# Patient Record
Sex: Female | Born: 1960 | Race: White | Hispanic: No | Marital: Married | Smoking: Former smoker
Health system: Southern US, Community
[De-identification: ages and names within clinical notes are randomized; demographics above are authoritative.]

## PROBLEM LIST (undated history)

## (undated) DIAGNOSIS — I10 Essential (primary) hypertension: Secondary | ICD-10-CM

## (undated) DIAGNOSIS — E119 Type 2 diabetes mellitus without complications: Secondary | ICD-10-CM

## (undated) DIAGNOSIS — F329 Major depressive disorder, single episode, unspecified: Secondary | ICD-10-CM

## (undated) DIAGNOSIS — I252 Old myocardial infarction: Secondary | ICD-10-CM

## (undated) DIAGNOSIS — Z87442 Personal history of urinary calculi: Secondary | ICD-10-CM

## (undated) DIAGNOSIS — M542 Cervicalgia: Secondary | ICD-10-CM

## (undated) DIAGNOSIS — R011 Cardiac murmur, unspecified: Secondary | ICD-10-CM

## (undated) DIAGNOSIS — F419 Anxiety disorder, unspecified: Secondary | ICD-10-CM

## (undated) DIAGNOSIS — I639 Cerebral infarction, unspecified: Secondary | ICD-10-CM

## (undated) DIAGNOSIS — F32A Depression, unspecified: Secondary | ICD-10-CM

## (undated) HISTORY — DX: Major depressive disorder, single episode, unspecified: F32.9

## (undated) HISTORY — DX: Essential (primary) hypertension: I10

## (undated) HISTORY — DX: Personal history of urinary calculi: Z87.442

## (undated) HISTORY — DX: Cardiac murmur, unspecified: R01.1

## (undated) HISTORY — DX: Depression, unspecified: F32.A

## (undated) HISTORY — DX: Cervicalgia: M54.2

## (undated) HISTORY — DX: Cerebral infarction, unspecified: I63.9

## (undated) HISTORY — DX: Old myocardial infarction: I25.2

## (undated) HISTORY — DX: Anxiety disorder, unspecified: F41.9

## (undated) HISTORY — PX: BACK SURGERY: SHX140

## (undated) HISTORY — PX: TUBAL LIGATION: SHX77

---

## 1998-09-07 ENCOUNTER — Ambulatory Visit (HOSPITAL_COMMUNITY): Admission: RE | Admit: 1998-09-07 | Discharge: 1998-09-07 | Payer: Self-pay | Admitting: Family Medicine

## 1998-09-07 ENCOUNTER — Encounter: Payer: Self-pay | Admitting: Family Medicine

## 1998-12-20 ENCOUNTER — Ambulatory Visit (HOSPITAL_COMMUNITY): Admission: RE | Admit: 1998-12-20 | Discharge: 1998-12-20 | Payer: Self-pay | Admitting: Family Medicine

## 1998-12-20 ENCOUNTER — Encounter: Payer: Self-pay | Admitting: Family Medicine

## 1999-07-05 ENCOUNTER — Encounter: Payer: Self-pay | Admitting: Neurology

## 1999-07-05 ENCOUNTER — Encounter: Admission: RE | Admit: 1999-07-05 | Discharge: 1999-07-05 | Payer: Self-pay | Admitting: Neurology

## 2000-07-29 ENCOUNTER — Emergency Department (HOSPITAL_COMMUNITY): Admission: EM | Admit: 2000-07-29 | Discharge: 2000-07-29 | Payer: Self-pay | Admitting: Emergency Medicine

## 2000-09-12 ENCOUNTER — Encounter: Admission: RE | Admit: 2000-09-12 | Discharge: 2000-09-12 | Payer: Self-pay | Admitting: Family Medicine

## 2000-09-12 ENCOUNTER — Encounter: Payer: Self-pay | Admitting: Family Medicine

## 2000-11-06 ENCOUNTER — Encounter: Admission: RE | Admit: 2000-11-06 | Discharge: 2000-12-12 | Payer: Self-pay | Admitting: Neurological Surgery

## 2005-12-31 ENCOUNTER — Ambulatory Visit: Payer: Self-pay | Admitting: Internal Medicine

## 2005-12-31 ENCOUNTER — Inpatient Hospital Stay (HOSPITAL_COMMUNITY): Admission: EM | Admit: 2005-12-31 | Discharge: 2006-01-02 | Payer: Self-pay | Admitting: *Deleted

## 2006-01-01 ENCOUNTER — Encounter (INDEPENDENT_AMBULATORY_CARE_PROVIDER_SITE_OTHER): Payer: Self-pay | Admitting: Cardiology

## 2006-02-26 ENCOUNTER — Ambulatory Visit: Payer: Self-pay | Admitting: Internal Medicine

## 2006-08-25 ENCOUNTER — Ambulatory Visit (HOSPITAL_COMMUNITY): Admission: RE | Admit: 2006-08-25 | Discharge: 2006-08-25 | Payer: Self-pay | Admitting: Family Medicine

## 2008-02-19 ENCOUNTER — Inpatient Hospital Stay (HOSPITAL_COMMUNITY): Admission: EM | Admit: 2008-02-19 | Discharge: 2008-02-20 | Payer: Self-pay | Admitting: Emergency Medicine

## 2008-09-15 ENCOUNTER — Emergency Department (HOSPITAL_COMMUNITY): Admission: EM | Admit: 2008-09-15 | Discharge: 2008-09-15 | Payer: Self-pay | Admitting: Emergency Medicine

## 2008-10-27 ENCOUNTER — Emergency Department (HOSPITAL_COMMUNITY): Admission: EM | Admit: 2008-10-27 | Discharge: 2008-10-27 | Payer: Self-pay | Admitting: Emergency Medicine

## 2009-01-23 ENCOUNTER — Emergency Department (HOSPITAL_COMMUNITY): Admission: EM | Admit: 2009-01-23 | Discharge: 2009-01-23 | Payer: Self-pay | Admitting: Emergency Medicine

## 2009-05-08 ENCOUNTER — Emergency Department (HOSPITAL_COMMUNITY): Admission: EM | Admit: 2009-05-08 | Discharge: 2009-05-08 | Payer: Self-pay | Admitting: Emergency Medicine

## 2009-05-10 ENCOUNTER — Encounter: Admission: RE | Admit: 2009-05-10 | Discharge: 2009-05-10 | Payer: Self-pay | Admitting: Family Medicine

## 2009-05-16 ENCOUNTER — Encounter: Admission: RE | Admit: 2009-05-16 | Discharge: 2009-05-16 | Payer: Self-pay | Admitting: Family Medicine

## 2009-06-04 ENCOUNTER — Encounter: Admission: RE | Admit: 2009-06-04 | Discharge: 2009-06-04 | Payer: Self-pay | Admitting: Orthopedic Surgery

## 2009-06-13 ENCOUNTER — Encounter: Admission: RE | Admit: 2009-06-13 | Discharge: 2009-06-13 | Payer: Self-pay | Admitting: Orthopedic Surgery

## 2009-06-15 ENCOUNTER — Emergency Department (HOSPITAL_COMMUNITY): Admission: EM | Admit: 2009-06-15 | Discharge: 2009-06-15 | Payer: Self-pay | Admitting: Emergency Medicine

## 2009-07-05 ENCOUNTER — Encounter: Admission: RE | Admit: 2009-07-05 | Discharge: 2009-07-05 | Payer: Self-pay | Admitting: Orthopedic Surgery

## 2009-07-25 ENCOUNTER — Encounter: Admission: RE | Admit: 2009-07-25 | Discharge: 2009-07-25 | Payer: Self-pay | Admitting: Orthopedic Surgery

## 2009-10-14 ENCOUNTER — Emergency Department: Payer: Self-pay | Admitting: Unknown Physician Specialty

## 2009-12-01 ENCOUNTER — Ambulatory Visit: Payer: Self-pay | Admitting: Family Medicine

## 2009-12-01 DIAGNOSIS — M545 Low back pain, unspecified: Secondary | ICD-10-CM | POA: Insufficient documentation

## 2009-12-01 DIAGNOSIS — I10 Essential (primary) hypertension: Secondary | ICD-10-CM

## 2009-12-01 DIAGNOSIS — F329 Major depressive disorder, single episode, unspecified: Secondary | ICD-10-CM

## 2009-12-01 DIAGNOSIS — R011 Cardiac murmur, unspecified: Secondary | ICD-10-CM | POA: Insufficient documentation

## 2009-12-01 DIAGNOSIS — N281 Cyst of kidney, acquired: Secondary | ICD-10-CM

## 2009-12-01 DIAGNOSIS — K5730 Diverticulosis of large intestine without perforation or abscess without bleeding: Secondary | ICD-10-CM | POA: Insufficient documentation

## 2009-12-01 DIAGNOSIS — F411 Generalized anxiety disorder: Secondary | ICD-10-CM | POA: Insufficient documentation

## 2009-12-04 ENCOUNTER — Ambulatory Visit: Payer: Self-pay | Admitting: Family Medicine

## 2009-12-04 ENCOUNTER — Encounter: Payer: Self-pay | Admitting: Family Medicine

## 2009-12-04 LAB — CONVERTED CEMR LAB
ALT: 25 units/L (ref 0–35)
AST: 23 units/L (ref 0–37)
Alkaline Phosphatase: 116 units/L (ref 39–117)
Creatinine, Ser: 0.62 mg/dL (ref 0.40–1.20)
Potassium: 3.9 meq/L (ref 3.5–5.3)
Sodium: 138 meq/L (ref 135–145)

## 2010-06-20 ENCOUNTER — Encounter (INDEPENDENT_AMBULATORY_CARE_PROVIDER_SITE_OTHER): Payer: Self-pay | Admitting: *Deleted

## 2010-07-01 IMAGING — CT CT L SPINE W/ CM
3 of 12 series · 10 of 33 positions shown, 12 images · IV contrast (omnipaque)
Comparison: MRI lumbar spine 08/04/2008

July 10, 2009 –DUPLICATE COPY for exam association in [facility]. No change from original report.
 MYELOGRAM INJECTION
TECHNIQUE: Informed consent was obtained from the patient prior to
 the procedure, including potential complications of headache,
 allergy, infection and pain. Specific instructions were given
 regarding 24 hour bedrest post procedure to prevent post-LP
 headache. A timeout procedure was performed. With the patient
 prone, the lower back was prepped with Betadine. 1% Lidocaine was
 used for local anesthesia. Lumbar puncture was performed by the
 radiologist at the L4 level using a 22 gauge needle with return of
 clear CSF. 15 cc of Omnipaque 180 was injected into the
 subarachnoid space .
CLINICAL DATA: Low back pain.
TECHNIQUE: Multidetector CT imaging of the lumbar spine was
 performed following myelography. Multiplanar CT image
 reconstructions were also generated.

[Series 4: thin recons · axial · 0.27mm/px · z∈[-275,-148]mm · 4 of 681 slices shown, 5 images]
[im 137/681  soft-tissue]
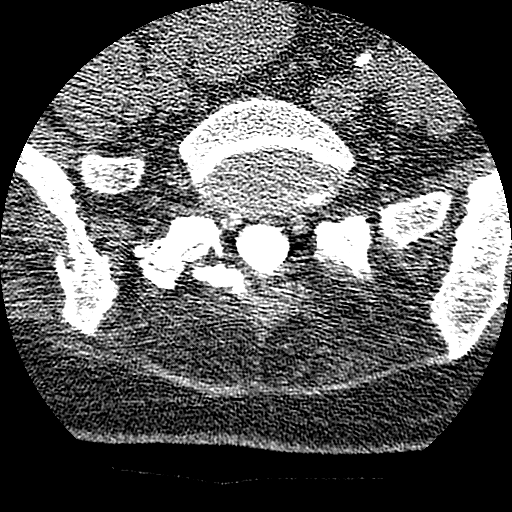
[im 137/681  bone]
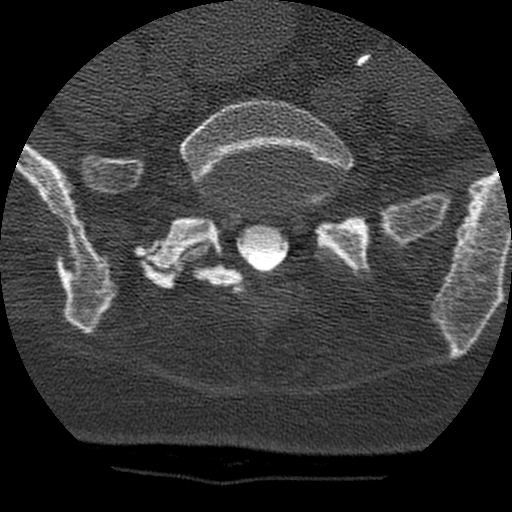
[im 273/681  bone]
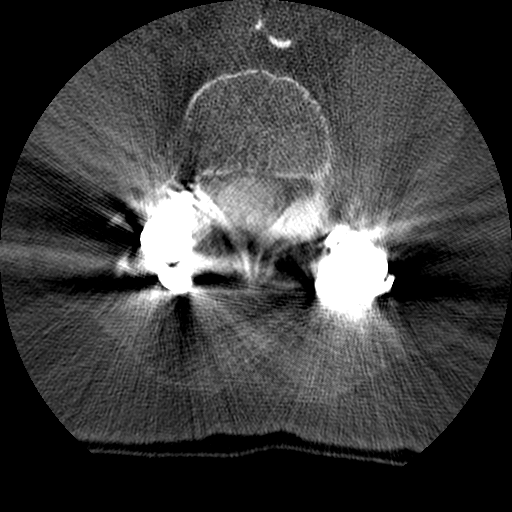
[im 409/681  bone]
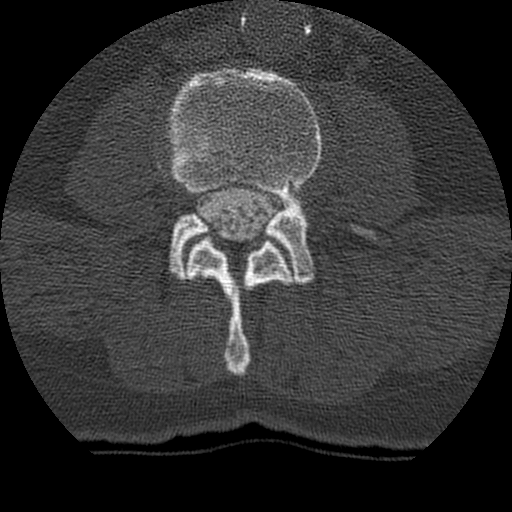
[im 545/681  bone]
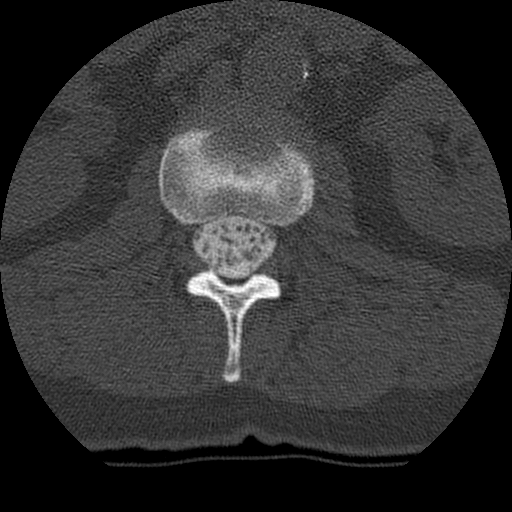

[Series 401: cor lower l-spine · coronal · 0.42mm/px · 1 of 38 slices shown]
[im 19/38  bone]
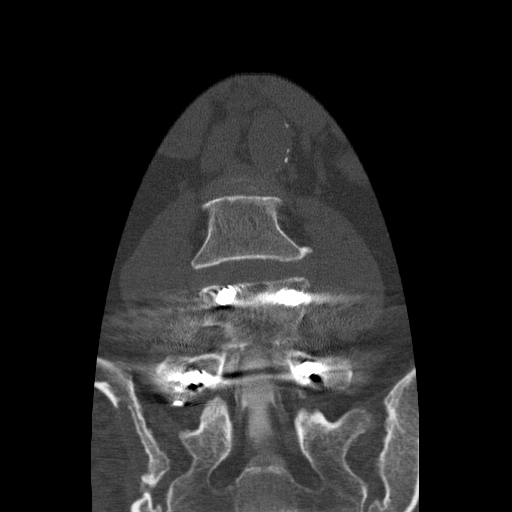

[Series 402: sag l-spine · sagittal · 0.42mm/px · 5 of 50 slices shown, 6 images]
[im 17/50  bone]
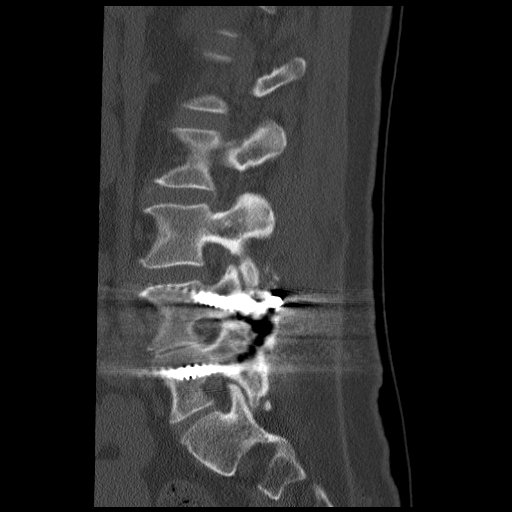
[im 21/50  bone]
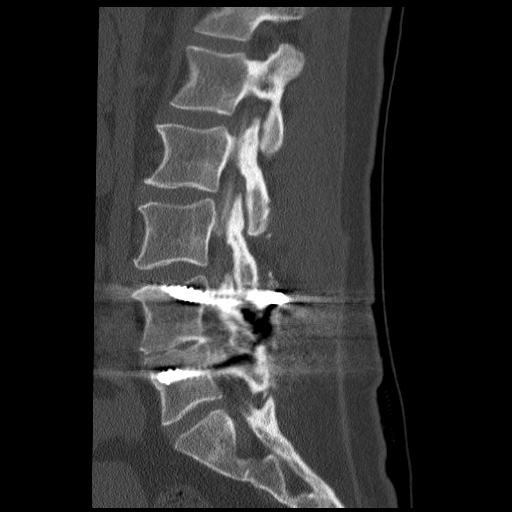
[im 25/50  soft-tissue]
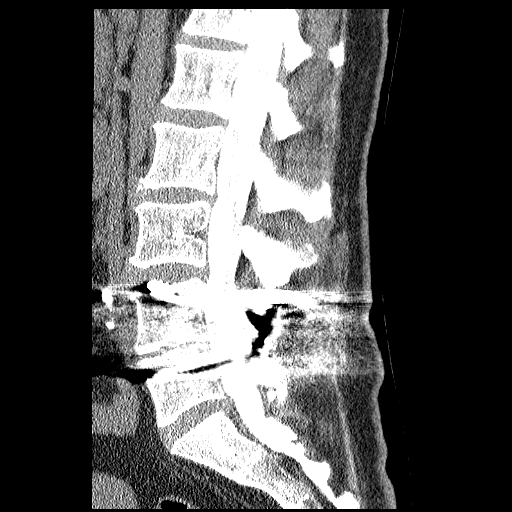
[im 25/50  bone]
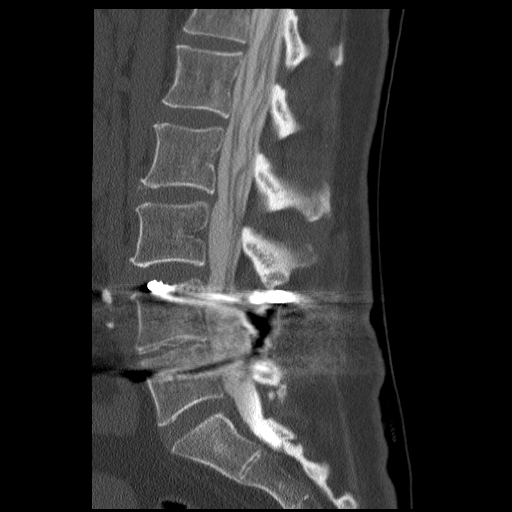
[im 29/50  bone]
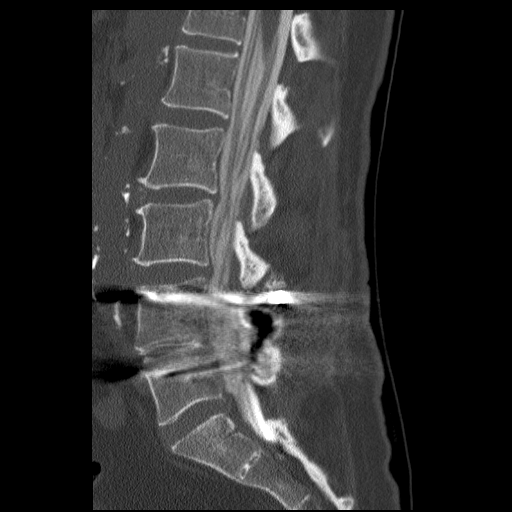
[im 33/50  bone]
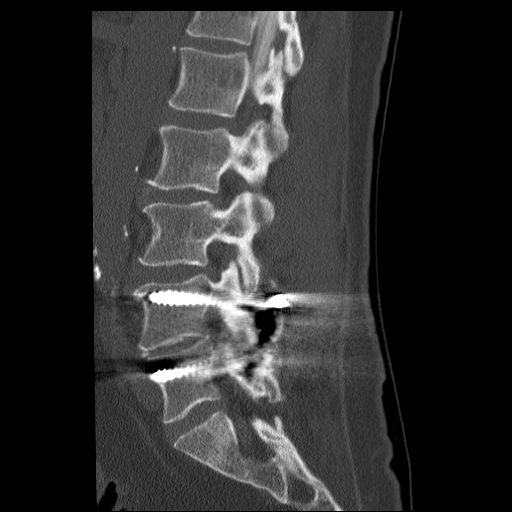

[10 of 33 positions shown; findings below may reference images not displayed]

IMPRESSION: Successful injection of intrathecal contrast for myelography.

 MYELOGRAM LUMBAR
FINDINGS: Good opacification lumbar subarachnoid space. Previous
 L4-5 fusion with instrumentation. Fusion appears solid. Hardware
 intact. Mild annular bulging at L3-4. Minimal effacement of the
 left L4 nerve root. Flexion/extension shows no dynamic
 instability.

 Fluoroscopy Time: 1.55 minutes
IMPRESSION: As above.

 CT MYELOGRAPHY LUMBAR SPINE
FINDINGS: No prevertebral or paraspinous masses. Mild
 nonaneurysmal vascular calcification.

 L1-2: Normal.

 L2-3: Mild bulge. No neural compression.

 L3-4: Shallow central and leftward protrusion. Minimal left L4
 nerve root effacement. No significant neural foraminal narrowing.
 The right-sided screw at L4 projects into the disc space slightly,
 but does not displace the right-sided nerve roots.

 L4-5: Susceptibility artifact degrades the images. No residual
 stenosis. No evidence for arachnoiditis. No significant neural
 foraminal narrowing. Solid fusion.

 L5-S1: Mild facet arthropathy. Minimal central and leftward
 bulge/subligamentous protrusion. This contacts, but does not seem
 to displace, left S1 nerve root.
IMPRESSION: Solid fusion L4-5 with hardware. No residual stenosis or neural
 encroachment.

 Minimal adjacent segment disease at L3-4 and L5 -S1, without
 significant left-sided neural compression.

## 2010-08-21 NOTE — Miscellaneous (Signed)
Summary: medical record request  Clinical Lists Changes  Rec'd medical record request to be faxed to disability determination services faxed 03/29/2010 Marily Memos  June 20, 2010 10:04 AM

## 2010-08-21 NOTE — Assessment & Plan Note (Signed)
Summary: NP, tcb   Vital Signs:  Patient profile:   50 year old female Height:      61 inches Weight:      138.8 pounds BMI:     26.32 Temp:     98.7 degrees F oral Pulse rate:   79 / minute BP sitting:   105 / 62  (left arm) Cuff size:   regular  Vitals Entered By: Garen Grams LPN (Dec 01, 2009 1:55 PM) CC: New Patient Is Patient Diabetic? No Pain Assessment Patient in pain? yes     Location: back & Left leg Intensity: 9   CC:  New Patient.  History of Present Illness: HTN- on benezapril, HCTZ, amlodipine. reports prior h/o SBPs >200s. doing well now. no chest pain, SOB, peripheral edema, headaches, blurred vision.  Back pain- h/o 4 back surgeries. has required ultram (which reportedly doesn't help) and percocet in past. reports trying to continue PT exercises 3 times a week she was given previously. +numbness/tingling of toes of L foot. no loss of bowel or bladder continence.   Depression- out of work. likely splitting from husband. in lots of pain. on citalopram. previously on lorazepam as well. denies SI/HI.   Habits & Providers  Alcohol-Tobacco-Diet     Alcohol drinks/day: <1     Alcohol Counseling: not indicated; use of alcohol is not excessive or problematic     Tobacco Status: current     Tobacco Counseling: to quit use of tobacco products     Cigarette Packs/Day: 0.5  Exercise-Depression-Behavior     Does Patient Exercise: no     Exercise Counseling: to improve exercise regimen     Have you felt down or hopeless? yes     Have you felt little pleasure in things? yes     Depression Counseling: further diagnostic testing and/or other treatment is indicated     Drug Use: marijuanna  Current Medications (verified): 1)  Benazepril Hcl 40 Mg Tabs (Benazepril Hcl) .... One Tab By Mouth Daily 2)  Hydrochlorothiazide 25 Mg Tabs (Hydrochlorothiazide) .... One Tab By Mouth Daily 3)  Norvasc 10 Mg Tabs (Amlodipine Besylate) .... One Tab By Mouth Daily 4)  Citalopram  Hydrobromide 40 Mg Tabs (Citalopram Hydrobromide) .... One Tab By Mouth Daily  Allergies (verified): No Known Drug Allergies  Past History:  Past Medical History: Current Problems:  SYSTOLIC MURMUR (AOZ-308.2) RENAL CYST (ICD-593.2) ANXIETY (ICD-300.00) DEPRESSION (ICD-311) DIVERTICULOSIS OF COLON (ICD-562.10) BACK PAIN, LUMBAR, CHRONIC (ICD-724.2) HYPERTENSION, BENIGN ESSENTIAL (ICD-401.1)  Past Surgical History: back surgeries x4, last 1996 BTL 11  Family History: HTN- mother CVA- mother CAD- mother, father (died of MI at 33) DM- sister, mother, maternal aunts, MGM  Social History: lives with husband Jorja Loa), mother in Social worker, and brother in Social worker. may be splitting from husband. +tobacco (down to 1/2 ppd). smoking >30 years. +marijuana. occasional EtOH, unemployedSmoking Status:  current Packs/Day:  0.5 Does Patient Exercise:  no Drug Use:  marijuanna  Physical Exam  General:  appears older than stated age. NAD. vitals reviewed.  Eyes:  EOMI, PERRLA Ears:  hearing grossly normal Nose:  no rhinorrhea Mouth:  MMM Lungs:  distant breath sounds, prolonged expiratory phase. normal WOB.  Heart:  II/VI SEM at LUSB. RRR Abdomen:  NT ND+BS. soft   Detailed Back/Spine Exam  Lumbosacral Exam:  Inspection-deformity:    Normal Palpation-spinal tenderness:  Normal Range of Motion:    Forward Flexion:   60 degrees    Hyperextension:   25 degrees  Schober's:        >6 cm    Right Lateral Bend:   25 degrees    Left Lateral Bend:   25 degrees Lying Straight Leg Raise:    Right:  negative    Left:  positive Reverse Straight Leg Raise:    Left:  negative Contralateral Straight Leg Raise:    Right:  negative    Left:  negative Sciatic Notch:    There is no sciatic notch tenderness. Toe Walking:    Right:  normal    Left:  normal Heel Walking:    Right:  normal    Left:  normal Fabere Test:    Right:  negative    Left:  negative   Impression &  Recommendations:  Problem # 1:  HYPERTENSION, BENIGN ESSENTIAL (ICD-401.1) Assessment New  check labs. change to combo pill. refills provided  Her updated medication list for this problem includes:    Benazepril-hydrochlorothiazide 20-25 Mg Tabs (Benazepril-hydrochlorothiazide) ..... One tab by mouth daily    Norvasc 10 Mg Tabs (Amlodipine besylate) ..... One tab by mouth daily  Future Orders: Direct LDL-[facility] ([id number]) ... 12/02/2010 Comp Met-[facility] ([id number]) ... 12/02/2010  BP today: 105/62  Problem # 2:  BACK PAIN, LUMBAR, CHRONIC (ICD-724.2) Assessment: New trial of meloxicam. informed patient that i would not be providing narcotic pain meds. continue rehab exercises. if obtains orange card, would refer to PT. start gabapentin for neuropathic pain  Her updated medication list for this problem includes:    Meloxicam 15 Mg Tabs (Meloxicam) ..... One tab by mouth daily  Problem # 3:  DEPRESSION (ICD-311) Assessment: New refill citalopram.  Her updated medication list for this problem includes:    Citalopram Hydrobromide 40 Mg Tabs (Citalopram hydrobromide) ..... One tab by mouth daily  Problem # 4:  ANXIETY (ICD-300.00) Assessment: New citalopram  Her updated medication list for this problem includes:    Citalopram Hydrobromide 40 Mg Tabs (Citalopram hydrobromide) ..... One tab by mouth daily  Patient Instructions: 1)  Nice to have met you. Hope things get better for you! 2)  Your new medications are on the list below: Meloxicam (antiinflammatory/pain), Gabapentin (nerve pain).  3)  Follow up after you get in touch with [facility] Prescriptions: MELOXICAM 15 MG TABS (MELOXICAM) one tab by mouth daily  #30 x 1   Entered and Authorized by:   Lequita Asal  MD   Signed by:   Lequita Asal  MD on 12/01/2009   Method used:   Electronically to        [facility]* (retail)       [address]       [place], [place]  [address]       Ph: [phone]       Fax:  [phone]   RxID:   [id number] GABAPENTIN 300 MG CAPS (GABAPENTIN) one tab by mouth qhs  #30 x 1   Entered and Authorized by:   Lequita Asal  MD   Signed by:   Lequita Asal  MD on 12/01/2009   Method used:   Electronically to        [facility]* (retail)       [address]       [place], [place]  [address]       Ph: [phone]       Fax: [phone]   RxID:   [id number] CITALOPRAM HYDROBROMIDE 40 MG TABS (CITALOPRAM HYDROBROMIDE) one tab by mouth daily  #90 x 1  Entered and Authorized by:   Lequita Asal  MD   Signed by:   Lequita Asal  MD on 12/01/2009   Method used:   Electronically to        [facility]* (retail)       [address]       [place], [place]  [address]       Ph: [phone]       Fax: [phone]   RxID:   [id number] NORVASC 10 MG TABS (AMLODIPINE BESYLATE) one tab by mouth daily  #90 x 1   Entered and Authorized by:   Lequita Asal  MD   Signed by:   Lequita Asal  MD on 12/01/2009   Method used:   Electronically to        [facility]* (retail)       [address]       [place], [place]  [address]       Ph: [phone]       Fax: [phone]   RxID:   [id number] BENAZEPRIL-HYDROCHLOROTHIAZIDE 20-25 MG TABS (BENAZEPRIL-HYDROCHLOROTHIAZIDE) one tab by mouth daily  #90 x 1   Entered and Authorized by:   Lequita Asal  MD   Signed by:   Lequita Asal  MD on 12/01/2009   Method used:   Electronically to        [facility]* (retail)       [address]       [place], [place]  [address]       Ph: [phone]       Fax: [phone]   RxID:   [id number]   Appended Document: NP, tcb     Allergies: No Known Drug Allergies   Complete Medication List: 1)  Benazepril-hydrochlorothiazide 20-25 Mg Tabs (Benazepril-hydrochlorothiazide) .... One tab by mouth daily 2)  Norvasc 10 Mg Tabs (Amlodipine besylate) .... One tab by mouth daily 3)  Citalopram Hydrobromide 40 Mg Tabs (Citalopram  hydrobromide) .... One tab by mouth daily 4)  Gabapentin 300 Mg Caps (Gabapentin) .... One tab by mouth qhs 5)  Meloxicam 15 Mg Tabs (Meloxicam) .... One tab by mouth daily    Medication Administration  Injection # 1:    Medication: Ketorolac-Toradol 15mg     Diagnosis: BACK PAIN, LUMBAR, CHRONIC (ICD-724.2)    Route: IM    Site: RUOQ gluteus    Exp Date: 06/22/2011    Lot #: [id number]    Mfr: hospira    Comments: pt given 60 ml    Patient tolerated injection without complications    Given by: Loralee Pacas CMA (Dec 01, 2009 5:35 PM)

## 2010-09-10 ENCOUNTER — Encounter: Payer: Self-pay | Admitting: *Deleted

## 2010-10-04 ENCOUNTER — Encounter: Payer: Self-pay | Admitting: Family Medicine

## 2010-10-04 ENCOUNTER — Ambulatory Visit (INDEPENDENT_AMBULATORY_CARE_PROVIDER_SITE_OTHER): Payer: Self-pay | Admitting: Family Medicine

## 2010-10-04 ENCOUNTER — Ambulatory Visit: Payer: Self-pay | Admitting: Family Medicine

## 2010-10-04 VITALS — BP 178/87 | HR 84 | Temp 98.5°F | Ht 60.0 in | Wt 132.9 lb

## 2010-10-04 DIAGNOSIS — F419 Anxiety disorder, unspecified: Secondary | ICD-10-CM

## 2010-10-04 DIAGNOSIS — F411 Generalized anxiety disorder: Secondary | ICD-10-CM

## 2010-10-04 DIAGNOSIS — R29898 Other symptoms and signs involving the musculoskeletal system: Secondary | ICD-10-CM

## 2010-10-04 DIAGNOSIS — R5383 Other fatigue: Secondary | ICD-10-CM

## 2010-10-04 DIAGNOSIS — I10 Essential (primary) hypertension: Secondary | ICD-10-CM

## 2010-10-04 DIAGNOSIS — M5430 Sciatica, unspecified side: Secondary | ICD-10-CM

## 2010-10-04 MED ORDER — GABAPENTIN 300 MG PO CAPS: 600.0000 mg | ORAL_CAPSULE | Freq: Three times a day (TID) | ORAL | Status: DC

## 2010-10-04 MED ORDER — CITALOPRAM HYDROBROMIDE 40 MG PO TABS: 40.0000 mg | ORAL_TABLET | Freq: Every day | ORAL | Status: DC

## 2010-10-04 MED ORDER — ALPRAZOLAM 0.5 MG PO TABS: 0.5000 mg | ORAL_TABLET | Freq: Three times a day (TID) | ORAL | Status: DC | PRN

## 2010-10-04 MED ORDER — BENAZEPRIL-HYDROCHLOROTHIAZIDE 20-25 MG PO TABS: 1.0000 | ORAL_TABLET | Freq: Every day | ORAL | Status: DC

## 2010-10-04 MED ORDER — AMLODIPINE BESYLATE 10 MG PO TABS: 10.0000 mg | ORAL_TABLET | Freq: Every day | ORAL | Status: DC

## 2010-10-04 NOTE — Progress Notes (Signed)
Subjective:    [name], female    DOB: [birth year], 50 y.o.   MRN: [id number]  HPI Left leg pain History of back surgery. Walks with limp. Has shooting radicular type pain with "needles" in left foot.  Sciatic distribution. No urinary/sensory/sexual deficits.   Fatigue Related to poor sleep at night secondary to leg pain, which frequently wakes her at night.  Bilateral arm weakness and hand numbness Weakness was not evident on examination. Her weakness is likely related to fatigue. Her hand numbness was subjective. She has a history of white matter changes on MRI per her report. She is worried about MS.   Anxiety She is very high energy, with pressured speech and fidgeting with her hands. She calmed down towards the end of the encounter, but this is causing her a lot of stress. She is HTN likely from her anxiety/pain. Her anxiety seems psychiatric, but could be related to her pain, recent separation from spouse, and loss of job.  HTN She was uncontrolled today. She has been out of her HTN meds for a couple weeks at least. She also has evident anxiety and pain.    Review of Systems  Constitutional: Positive for diaphoresis, activity change and fatigue. Negative for fever, chills, appetite change and unexpected weight change.  Respiratory: Negative for chest tightness and shortness of breath.   Cardiovascular: Positive for chest pain and palpitations. Negative for leg swelling.  Gastrointestinal: Negative for vomiting, abdominal pain and constipation.  Musculoskeletal: Positive for back pain. Negative for arthralgias.  Skin: Negative for rash.  Neurological: Positive for weakness and numbness. Negative for dizziness, tremors, seizures, syncope, facial asymmetry, speech difficulty, light-headedness and headaches.  Psychiatric/Behavioral: Positive for sleep disturbance and agitation. The patient is nervous/anxious and is hyperactive.        Objective:   Physical  Exam  Constitutional: She is oriented to person, place, and time. She appears well-developed and well-nourished.  HENT:  Head: Normocephalic and atraumatic.  Eyes: EOM are normal. Pupils are equal, round, and reactive to light.  Neck: Neck supple.  Cardiovascular: Normal rate, regular rhythm and normal heart sounds.   Pulmonary/Chest: Effort normal and breath sounds normal. No respiratory distress. She exhibits no tenderness.  Abdominal: Soft. She exhibits no distension. There is no tenderness.  Musculoskeletal: Normal range of motion.  Neurological: She is alert and oriented to person, place, and time. She has normal strength. No cranial nerve deficit or sensory deficit. GCS eye subscore is 4. GCS verbal subscore is 5. GCS motor subscore is 6.  Skin: Skin is warm and intact.  Psychiatric: Her mood appears anxious. Her affect is not angry and not inappropriate. Her speech is rapid and/or pressured. She is agitated and is hyperactive. She is not aggressive, not slowed, not withdrawn, not actively hallucinating and not combative. Thought content is not paranoid. Cognition and memory are not impaired. She does not express inappropriate judgment. She does not exhibit a depressed mood. She expresses no homicidal and no suicidal ideation. She is attentive.        Assessment & Plan:  Pt. Is a 50 y/o c/f with anxiety, htn, leg pain, fatigue. 1. Left leg pain Left sciatic nerve, neuropathic pain. Started Neurontin  2. Fatigue Related to poor sleep at night secondary to leg pain, which frequently wakes her at night. Xanax for her anxiety and neurontin for her pain will help her sleep at night.  3. Bilateral arm weakness and hand numbness Not consistent with my exam. Will  see how the neurontin helps her.  4. Anxiety Multifactorial. Xanax TID and continued citalopram.  5. HTN Refilled her medications that she has not taken for the last 2 weeks. Multifactorial.

## 2010-10-05 ENCOUNTER — Other Ambulatory Visit: Payer: Self-pay | Admitting: Family Medicine

## 2010-10-05 MED ORDER — AMLODIPINE BESYLATE 10 MG PO TABS: 10.0000 mg | ORAL_TABLET | Freq: Every day | ORAL | Status: DC

## 2010-10-05 MED ORDER — BENAZEPRIL-HYDROCHLOROTHIAZIDE 20-25 MG PO TABS: 1.0000 | ORAL_TABLET | Freq: Every day | ORAL | Status: DC

## 2010-10-05 MED ORDER — CITALOPRAM HYDROBROMIDE 40 MG PO TABS: 40.0000 mg | ORAL_TABLET | Freq: Every day | ORAL | Status: DC

## 2010-10-05 MED ORDER — GABAPENTIN 300 MG PO CAPS: 600.0000 mg | ORAL_CAPSULE | Freq: Three times a day (TID) | ORAL | Status: DC

## 2010-10-24 LAB — BASIC METABOLIC PANEL
GFR calc Af Amer: >60 mL/min (ref >60)
GFR calc non Af Amer: >60 mL/min (ref >60)
Glucose, Bld: 132 mg/dL — ABNORMAL HIGH (ref 70–99)
Potassium: 3.5 mEq/L (ref 3.5–5.1)
Sodium: 138 mEq/L (ref 135–145)

## 2010-10-24 LAB — CBC
HCT: 43 % (ref 36.0–46.0)
Hemoglobin: 14.8 g/dL (ref 12.0–15.0)
RBC: 4.7 MIL/uL (ref 3.87–5.11)
WBC: 9.7 10*3/uL (ref 4.0–10.5)

## 2010-10-24 LAB — URINALYSIS, ROUTINE W REFLEX MICROSCOPIC
Bilirubin Urine: NEGATIVE
Nitrite: NEGATIVE
Specific Gravity, Urine: 1.01 (ref 1.005–1.030)
pH: 7 (ref 5.0–8.0)

## 2010-10-24 LAB — DIFFERENTIAL
Basophils Absolute: 0 10*3/uL (ref 0.0–0.1)
Basophils Relative: 0 % (ref 0–1)
Eosinophils Absolute: 0.1 10*3/uL (ref 0.0–0.7)
Eosinophils Relative: 1 % (ref 0–5)
Neutrophils Relative %: 73 % (ref 43–77)

## 2010-10-24 LAB — PROTIME-INR
INR: 1 (ref 0.00–1.49)
Prothrombin Time: 13.1 seconds (ref 11.6–15.2)

## 2010-10-25 LAB — POCT URINALYSIS DIP (DEVICE)
Bilirubin Urine: NEGATIVE
Glucose, UA: NEGATIVE mg/dL
Ketones, ur: NEGATIVE mg/dL
Nitrite: NEGATIVE
pH: 6 (ref 5.0–8.0)

## 2010-11-26 ENCOUNTER — Encounter: Payer: Self-pay | Admitting: Family Medicine

## 2010-11-26 ENCOUNTER — Ambulatory Visit (INDEPENDENT_AMBULATORY_CARE_PROVIDER_SITE_OTHER): Payer: Self-pay | Admitting: Family Medicine

## 2010-11-26 VITALS — BP 157/78 | HR 61 | Temp 98.1°F | Ht 60.0 in | Wt 138.8 lb

## 2010-11-26 DIAGNOSIS — I10 Essential (primary) hypertension: Secondary | ICD-10-CM

## 2010-11-26 DIAGNOSIS — M545 Low back pain: Secondary | ICD-10-CM

## 2010-11-26 DIAGNOSIS — M5430 Sciatica, unspecified side: Secondary | ICD-10-CM

## 2010-11-26 DIAGNOSIS — G8929 Other chronic pain: Secondary | ICD-10-CM

## 2010-11-26 LAB — BASIC METABOLIC PANEL
BUN: 15 mg/dL (ref 6–23)
CO2: 29 mEq/L (ref 19–32)
Chloride: 103 mEq/L (ref 96–112)
Creat: 0.52 mg/dL (ref 0.40–1.20)
Potassium: 3.7 mEq/L (ref 3.5–5.3)

## 2010-11-26 MED ORDER — MELOXICAM 7.5 MG PO TABS: 7.5000 mg | ORAL_TABLET | Freq: Every day | ORAL | Status: DC

## 2010-11-26 MED ORDER — HYDROCHLOROTHIAZIDE 12.5 MG PO CAPS: 25.0000 mg | ORAL_CAPSULE | Freq: Every day | ORAL | Status: DC

## 2010-11-26 MED ORDER — LISINOPRIL 10 MG PO TABS: 10.0000 mg | ORAL_TABLET | Freq: Every day | ORAL | Status: DC

## 2010-11-26 MED ORDER — GABAPENTIN 800 MG PO TABS: 800.0000 mg | ORAL_TABLET | Freq: Every day | ORAL | Status: DC

## 2010-11-26 MED ORDER — CYCLOBENZAPRINE HCL 10 MG PO TABS: 10.0000 mg | ORAL_TABLET | Freq: Three times a day (TID) | ORAL | Status: DC | PRN

## 2010-11-26 NOTE — Progress Notes (Signed)
**Note Toni-Identified via Obfuscation**   Subjective:    [name], female    DOB: [birth year], 50 y.o.   MRN: [id number]  HPI 1. 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 See MRI/CT scan. Ortho rec: pain management. Patient has had 80% relief on the neurontin I prescribed.  2. Lower Back Pain Mainly paraspinal lumbar pain. Extensive paraspinal muscle tenderness and spasm.  No urinary symptoms. Some abnormal gait - but from left leg. She had no bony deformity or tenderness. No CVAT. No saddle anesthesia.  3. HTN Patients BP was elevated, this is likely because she has not been taking her benazepril/hctz - her pharmacy does not stock this combined pill.  Review of Systems  Constitutional: Positive for activity change. Negative for fever, chills, fatigue and unexpected weight change.  Eyes: Negative for photophobia and visual disturbance.  Respiratory: Negative for chest tightness and shortness of breath.   Cardiovascular: Negative for chest pain.  Gastrointestinal: Negative for abdominal pain.  Genitourinary: Negative for dysuria, urgency, frequency, hematuria, flank pain, decreased urine volume, enuresis and difficulty urinating.  Musculoskeletal: Positive for myalgias, back pain and gait problem. Negative for joint swelling and arthralgias.  Skin: Negative for rash.  Neurological: Negative for dizziness, syncope and headaches.       Objective:   Physical Exam  Nursing note and vitals reviewed. Constitutional: She is oriented to person, place, and time.  Musculoskeletal:       Lumbar back: She exhibits decreased range of motion, tenderness, pain and spasm. She exhibits no bony tenderness, no swelling, no edema and no deformity.  Neurological: She is alert and oriented to person, place, and time. She has normal reflexes. No cranial nerve deficit. Coordination normal.  Psychiatric: She has a normal mood and affect. Her behavior is normal.       Assessment & Plan:  1. Sciatica 80%  improvement with Neurontin, will increase dose.  2. Lower Back Pain Flexeril mobic Will get BMP to check CR  3. HTN Patients BP was elevated, this is likely because she has not been taking her benazepril/hctz - her pharmacy does not stock this combined pill. Prescribed HCTZ/Lisinopril separately. Will check Bun/CR

## 2010-11-27 ENCOUNTER — Encounter: Payer: Self-pay | Admitting: Family Medicine

## 2010-11-30 ENCOUNTER — Telehealth: Payer: Self-pay | Admitting: Family Medicine

## 2010-11-30 NOTE — Telephone Encounter (Signed)
Pharmacy was wanting to know if they could substitute Accuretic 10/12.5 for the Lisinopril.  Patient can get this for free.  Told them okay.

## 2010-11-30 NOTE — Telephone Encounter (Signed)
Returning [employer]

## 2010-12-04 NOTE — Discharge Summary (Signed)
NAMERENA, [name]            ACCOUNT NO.:  [id number]   MEDICAL RECORD NO.:  [id number]          PATIENT TYPE:  INP   LOCATION:  3733                         FACILITY:  [facility]   PHYSICIAN:  Corinna L. Lendell Caprice, MDDATE OF BIRTH:  1960/09/05   DATE OF ADMISSION:  02/19/2008  DATE OF DISCHARGE:  02/20/2008                               DISCHARGE SUMMARY   DISCHARGE DIAGNOSES:  1. Chest pain, M.I. and pulmonary embolism ruled out.  2. Syncope, suspect vasovagal.  3. History of anxiety.  4. Upper respiratory tract infection, suspect viral.  5. Gastroesophageal reflux disease.  6. Hypertension.  7. Tobacco abuse, counseled against.  8. Marijuana use, counseled against.  9. Headache.  10.Bradycardia.   DISCHARGE MEDICATIONS:  Ibuprofen as needed for headache or Tylenol as  needed for headache or Dilaudid 2 mg p.o. q.4 h. p.r.n. pain, five were  dispensed.  She may continue the rest of her outpatient medications  which include amlodipine 5 mg a day, benazepril 20 mg a day, citalopram  20 mg a day, lorazepam 0.5 mg p.o. t.i.d. as needed, antacid daily.   FOLLOWUP:  With Dr. Tiburcio Pea in 1-2 weeks.   CONDITION:  Stable.   ACTIVITY:  No heavy exertion for a week.  She may return to work on  February 26, 2008.   DIET:  Drink plenty of fluids.   CONDITION:  Stable.   CONSULTATIONS:  None.   PROCEDURES:  None.   LABORATORY DATA:  CBC unremarkable.  Basic metabolic panel unremarkable.  Hemoglobin A1c 5.8, BNP 225, LDL 107, HDL 33, triglycerides 159,  cholesterol 172, TSH 1.518.  Urinalysis:  Specific gravity 1.013,  negative nitrite, negative leukocyte esterase, negative blood, negative  ketones, blood alcohol less than 5.  Urine drug screen positive for THC.   SPECIAL STUDIES/RADIOLOGY:  Chest x-ray showed nothing acute.  EKG  showed sinus bradycardia with a rate of 49.   HISTORY AND HOSPITAL COURSE:  [name] is a pleasant 50 year old  white female with a history of  hypertension who presented with chest  pain and brief episode of syncope.  She has had a headache, hoarseness,  and nonproductive cough recently.  She has had no fevers or chills.  She  was admitted to telemetry where she ruled out for MI.  Her blood  pressure was elevated on admission 205/104.  Currently, her blood  pressure is 140/65.  She had negative orthostatics.  She did have some  asymptomatic bradycardia during her hospitalization, but had no further  chest pain or syncope.  She has had cough, headache, and hoarseness over  the past several days.  I have encouraged her to quit smoking.  She has  a negative chest x-ray, negative D-dimer, negative cardiac enzymes, and  is requesting to go home.  She is tolerating a diet.  I asked that she  follow up with Dr. Tiburcio Pea for blood pressure and heart rate check next  week.  If she remains bradycardic, consider Holter monitor, but she is  asymptomatic at this time and is on no medications that can cause this.  She had a negative Cardiolite  and an echocardiogram two years ago.      Corinna L. Lendell Caprice, MD  Electronically Signed     CLS/MEDQ  D:  02/20/2008  T:  02/21/2008  Job:  [id number]   cc:   Holley Bouche, M.D.

## 2010-12-04 NOTE — H&P (Signed)
[name], [name]            ACCOUNT NO.:  [id number]   MEDICAL RECORD NO.:  [id number]          PATIENT TYPE:  INP   LOCATION:  3733                         FACILITY:  [facility]   PHYSICIAN:  Michiel Cowboy, MDDATE OF BIRTH:  1961-01-12   DATE OF ADMISSION:  02/19/2008  DATE OF DISCHARGE:                              HISTORY & PHYSICAL   PRIMARY CARE [name]:  [name], M.D.   CHIEF COMPLAINT:  Chest pain and syncope.   The patient is a 50 year old female with a history of severe  hypertension, history of tobacco abuse, remote history of cocaine abuse,  and GERD.  The patient for past 2 weeks has been having intermittent  left chest pain.  The pain is almost underneath her armpit and does not  radiate to her arm.  It is nonexertional, nonassociated shortness of  breath, nonappositional, does not get worse with deep breathing, and  lasts about a minute or so when it comes on.  She had an intense episode  this morning and presented to emergency department where she received 2  nitroglycerin, which caused her to have fairly severe headache, but  coronary chest pain has resolved.   The patient has a fairly active lifestyle, walks a lot, and also smokes  a pack of cigarettes per day.  Denies any cocaine use.  The patient also  noticed that today at work, she had passed out per her co-workers.  The  patient cannot recollect any of this.  She does not know exactly what  happened, but per her co-workers, she reportedly was unresponsive and  they wanted her to be evaluated in the emergency department.  Also, the  patient was having a cough for the past 1 month or so and also having  some hoarseness in her speech.  This is worse when she exerts herself.   REVIEW OF SYSTEMS:  Negative except for HPI.   PAST MEDICAL HISTORY:  Significant for;  1. Nonanginal chest pain in 2007 with 2D echo within normal limits and      a Cardiolite, which was negative.  2. History of GERD.  3. Hypertension.  4. Tobacco abuse.  5. Remote history of cocaine 2 years ago.   MEDICATIONS:  1. The patient is supposed to be on Ativan as needed for anxiety,      unsure of the dose.  2. Amlodipine 5 mg p.o. daily.  3. Citalopram 20 mg p.o. daily.  4. Lotensin 20 mg p.o. daily.  5. Also reports taking metoprolol but unsure of the dose.  Per      discharge summary, supposed to be on metoprolol 100 mg p.o. b.i.d.   SOCIAL HISTORY:  The patient smokes about a pack a day.  Drinks  occasional alcohol.  Uses cannabis.  Past history of cocaine but not for  the past 2 years per the patient.   FAMILY HISTORY:  Noncontributory.   PHYSICAL EXAMINATION:  VITALS:  In the emergency department, initially  blood pressure was elevated at 205/104 but came down to 142/70, pulse  initially was 76 but came down to 50 after administration of  her meds,  respirations 22, and satting 99% on room air.  GENERAL:  The patient appears to be currently in no acute distress.  LUNGS:  Clear to auscultation bilaterally.  HEART:  Regular rate and rhythm but slow.  ABDOMEN:  Soft, nontender, and nondistended.  LOWER EXTREMITIES:  Without edema.  NEUROLOGIC:  Intact.   LABORATORY DATA:  White blood cell count 6.5, hemoglobin 12.2, platelets  188, and INR 1.0.  Sodium 147, potassium 3.7, and creatinine 0.5.  Cardiac enzymes negative.  D-dimer negative.  U-tox positive for  cannabinoids.  UA negative.  Chest x-ray negative.  EKG, normal sinus  rhythm but unable to locate an EKG itself.  We will repeat.   ASSESSMENT AND PLAN:  1. Chest pain, likely noncardiac but given risk factors, we will cycle      cardiac enzymes and repeat EKG.  Check fasting lipid panel and TSH.      D-dimer was negative.  2. Syncope.  Although very unclear history, we will monitor the      patient on telemetry.  Currently, somewhat bradycardic but      asymptomatic.  We will order 2-D echo.  Check TSH.  We will check      orthostatics.   We will give intravenous fluids and monitor.  3. History of anxiety.  Continue Ativan.  4. Hypertension.  Initially fairly severe but currently came down.  We      will check frequent vitals, hydralazine p.r.n.  Restart Lotensin,      Norvasc, and metoprolol.  We will decrease the dose of metoprolol      to 50 b.i.d. and do holding parameters.  5. History of gastroesophageal reflux disease.  Continue Protonix.  6. Prophylaxis.  Do Lovenox plus Protonix.  We will admit to      telemetry.      Michiel Cowboy, MD  Electronically Signed     AVD/MEDQ  D:  02/19/2008  T:  02/20/2008  Job:  [id number]   cc:   [name], M.D.

## 2010-12-07 NOTE — Discharge Summary (Signed)
NAMEMICKALA, [name]            ACCOUNT NO.:  [id number]   MEDICAL RECORD NO.:  [id number]          PATIENT TYPE:  INP   LOCATION:  4735                         FACILITY:  [facility]   PHYSICIAN:  Dennis Bast, MD        DATE OF BIRTH:  21-Sep-1960   DATE OF ADMISSION:  12/31/2005  DATE OF DISCHARGE:  01/02/2006                                 DISCHARGE SUMMARY   DISCHARGE DIAGNOSES:  1.  Non-angina chest pain.  2.  Gastroesophageal reflux disease.  3.  Hypertension.  4.  Tobacco abuse.   DISCHARGE MEDICATIONS:  1.  Aspirin 325 mg daily.  2.  Lisinopril 10 mg daily.  3.  Metoprolol 100 mg p.o. b.i.d.   DISPOSITION:  The patient is go home with a followup with Dr. Sharyn Lull on  January 14, 2006 at 3:30.   PROCEDURE:  1.  Chest x-ray on December 31, 2005.  2.  Cardiolite on January 01, 2006.  3.  Chest x-ray on January 01, 2006.  4.  2D echo on January 01, 2006.   IMPRESSION:  1.  No evidence of ischemia.  2.  Mild anterior wall attenuation of both the stress and rest studies.  3.  Left ventricular ejection fraction 60%.  4.  2D echo.  No wall motion abnormalities, and left ventricular systolic      function normal.   CONSULTANTS:  Dr. Sharyn Lull.   HISTORY OF PRESENT ILLNESS:  A 50 year old woman with past medical history  significant for tobacco abuse, hypertension-on no treatment, and possibly,  family history of coronary artery disease and peripheral vascular disease,  comes with 3-day history of retrosternal chest pain, constant, as pressure,  non radiated, plus shortness of breath, pain 4-7/10, with nausea.  The pain  comes at rest.  Also, history of left-sided neck pain and left upper  extremity numbness, on and off for the last 6 months, mainly at effort.  She  has history of GERD, and the patient states that the pain is different to  the pain that she gets with GERD.   PHYSICAL EXAMINATION:  Blood pressure 206/107, pulse 82, temperature 98.2,  respiratory rate 20, saturation  approximately 98% on room air.   EKG admission showed sinus rhythm and Q wave on III and aVF.  No ST changes.  Chest x-ray was normal.  Cardiac enzyme x3 were within normal limits.  White  blood cell count 9.5, hemoglobin 13.5, hematocrit 39.3, platelet count 196.  TSH 1.137.  Urine drug screen was negative.  D-dimer less than 0.22.  Sodium  135, potassium 3.6, chloride 102, CO2 26, glucose 94, BUN 16, creatinine  0.6, calcium 9.0.  Total cholesterol 168, triglyceride 118, cholesterol HDL  49, LDL 95.   HOSPITAL COURSE:  1.  Non-angina chest pain.  Initially, on admission, because of the history      and Q waves on EKG, the patient was treated as unstable angina, with IV      nitrates, IV heparin, and aspirin  plus beta-blockers and ACE      inhibitors.  The pain decreased over 36 hours and  cardiology was      consulted.  They decided to do a Cardiolite, which was negative for      ischemia, and it was decided to send the patient home on aspirin, with a      followup with cardiology in 2 weeks.  At the time of discharge, the      patient was free of pain, and stable.  2.  GERD.  The patient was continued on Protonix.  No further issues from      this point.  3.  Hypertension.  The patient, even though she knew that she was      hypertensive, was not taking any medication, so she was sent home on an      ACE inhibitor and metoprolol.  At the time of her discharge, her blood      pressure was 126/79, and this is going to be followed up as an      outpatient.  4.  Tobacco abuse.  The patient was advised to quit smoking, and also this      is going to be followed up as an outpatient.   LABS AT DISCHARGE:  Sodium 137, potassium 3.7, chloride 107, CO2 26, glucose  122, BUN 17, creatinine 0.6, calcium 8.8, white blood cell count 8.3,  hemoglobin 11.9, hematocrit 34.7, MCV 93, platelet count 171.      Dennis Bast, MD     YC/MEDQ  D:  01/04/2006  T:  01/04/2006  Job:  [id number]   cc:    Eduardo Osier. Sharyn Lull, M.D.  Fax: [phone]

## 2010-12-11 ENCOUNTER — Telehealth: Payer: Self-pay | Admitting: Family Medicine

## 2010-12-11 NOTE — Telephone Encounter (Signed)
She is having problems with the neurontin and wants to talk to doctor or nurse

## 2010-12-13 NOTE — Telephone Encounter (Signed)
Pt states "Something don't feel right and still in a whole lot of pain."  Still hadn't gotten the bp meds from [facility] she has an appt with Dr. Rivka Safer on Wednesday at 930. It helps with some pain but not her back pain.Marland KitchenMarland KitchenLoralee Pacas Daniels

## 2010-12-19 ENCOUNTER — Ambulatory Visit (INDEPENDENT_AMBULATORY_CARE_PROVIDER_SITE_OTHER): Payer: Self-pay | Admitting: Family Medicine

## 2010-12-19 ENCOUNTER — Encounter: Payer: Self-pay | Admitting: Family Medicine

## 2010-12-19 ENCOUNTER — Telehealth: Payer: Self-pay | Admitting: Family Medicine

## 2010-12-19 DIAGNOSIS — M549 Dorsalgia, unspecified: Secondary | ICD-10-CM

## 2010-12-19 DIAGNOSIS — Z1239 Encounter for other screening for malignant neoplasm of breast: Secondary | ICD-10-CM

## 2010-12-19 DIAGNOSIS — G4700 Insomnia, unspecified: Secondary | ICD-10-CM

## 2010-12-19 MED ORDER — OXYCODONE HCL 10 MG PO TB12: 10.0000 mg | ORAL_TABLET | Freq: Two times a day (BID) | ORAL | Status: DC | PRN

## 2010-12-19 MED ORDER — HYDROXYZINE HCL 50 MG PO TABS: 50.0000 mg | ORAL_TABLET | Freq: Every evening | ORAL | Status: DC | PRN

## 2010-12-19 NOTE — Telephone Encounter (Signed)
Pt was prescribed oxycontin today but it costs$120, pt wants to know if MD can prescribe something cheaper, does not have ins or a lot of money.

## 2010-12-19 NOTE — Progress Notes (Signed)
  Subjective:    [name], female    DOB: Jan 30, 1961, 50 y.o.   MRN: [id number]  HPI 1. Back Pain Chronic back pain that I have been treating. The Neurontin has helped a lot, but the mobic isnt helping. I will try a narcotic despite not wanting to escalate to this. She is having constant chronic pain however, and this maybe the only thing that will give her relief.   2. HTN Elevated today. She still hasn't been able to get the HCTZ/Lisinopril because the pharmacy is apparently out? She will follow up with pharmacy today. No headache, no visual changes, no chest pain.  3. Screening for breast CA -advised and given [facility]. -no lumps, no weight loss.  4. Insomnia Mainly because of the pain she feels, the oxycodone may help with this, I prescribed Atarax PRN/QHS as well in case she needs something more. She was advised not the take both the oxycodone and atarax together because of potential respiratory complications.   Review of Systems  All other systems reviewed and are negative.       Objective:   Physical Exam  Constitutional: She appears well-developed and well-nourished. No distress.  HENT:  Head: Normocephalic and atraumatic.  Eyes: Pupils are equal, round, and reactive to light.  Neck: Normal range of motion. Neck supple.  Cardiovascular: Normal rate, regular rhythm and normal heart sounds.   Pulmonary/Chest: Effort normal and breath sounds normal. No respiratory distress. She has no wheezes. She has no rales.  Abdominal: Soft. She exhibits no distension. There is no tenderness.  Musculoskeletal:       Lumbar back: She exhibits tenderness and pain. She exhibits no swelling, no deformity and no spasm.  Skin: She is not diaphoretic.        Assessment & Plan:  1. Back Pain Chronic back pain that I have been treating. The Neurontin has helped a lot, but the mobic isnt helping. I will try a narcotic  despite not wanting to escalate to this. She is having constant chronic pain however, and this maybe the only thing that will give her relief.   2. HTN Elevated today. She still hasn't been able to get the HCTZ/Lisinopril because the pharmacy is apparently out? She will follow up with pharmacy today. No headache, no visual changes, no chest pain.  3. Screening for breast CA -advised and given instructions for [facility]. -no lumps, no weight loss.  4. Insomnia Mainly because of the pain she feels, the oxycodone may help with this, I prescribed Atarax PRN/QHS as well in case she needs something more. She was advised not the take both the oxycodone and atarax together because of potential respiratory complications.

## 2010-12-28 ENCOUNTER — Ambulatory Visit (HOSPITAL_COMMUNITY): Payer: Self-pay

## 2011-01-02 ENCOUNTER — Telehealth: Payer: Self-pay | Admitting: *Deleted

## 2011-01-02 ENCOUNTER — Telehealth: Payer: Self-pay | Admitting: Family Medicine

## 2011-01-02 NOTE — Telephone Encounter (Signed)
She can have hydroxyzine in whatever form they ;have available.

## 2011-01-02 NOTE — Telephone Encounter (Signed)
[name] has been in touch with her lawyer about her disability papers and would like to see if they can be completed quickly so she can get her medications.

## 2011-01-02 NOTE — Telephone Encounter (Signed)
Advised that Vistaril will be fine.

## 2011-01-02 NOTE — Telephone Encounter (Addendum)
Received call from [facility]  Advising they are unable to provide the Atarax prescribed on 12/19/2010 by Dr. Rivka Safer. They do have Vistaril . Paged Dr. Rivka Safer , unavailable. Will  consult preceptor.

## 2011-01-07 ENCOUNTER — Ambulatory Visit (HOSPITAL_COMMUNITY)
Admission: RE | Admit: 2011-01-07 | Discharge: 2011-01-07 | Disposition: A | Discharge status: Home / Self Care | Payer: Self-pay | Source: Ambulatory Visit | Attending: Family Medicine | Admitting: Family Medicine

## 2011-01-07 DIAGNOSIS — Z1239 Encounter for other screening for malignant neoplasm of breast: Secondary | ICD-10-CM

## 2011-01-07 DIAGNOSIS — Z1231 Encounter for screening mammogram for malignant neoplasm of breast: Secondary | ICD-10-CM | POA: Insufficient documentation

## 2011-01-09 ENCOUNTER — Other Ambulatory Visit: Payer: Self-pay | Admitting: Family Medicine

## 2011-01-09 DIAGNOSIS — R928 Other abnormal and inconclusive findings on diagnostic imaging of breast: Secondary | ICD-10-CM

## 2011-01-11 ENCOUNTER — Telehealth: Payer: Self-pay | Admitting: Family Medicine

## 2011-01-11 NOTE — Telephone Encounter (Signed)
Discussed abnormal mammogram. She is already scheduled for follow up imaging at the breast center.

## 2011-01-11 NOTE — Telephone Encounter (Signed)
Message copied by Edd Arbour on Fri Jan 11, 2011  1:30 PM ------      Message from: Barbaraann Barthel      Created: Tue Jan 08, 2011  5:27 PM       Cletis Athens,      I don;t know if this result came to you.  Abnormal mammogram.      JB      ----- Message -----         From: Rad Results In Interface         Sent: 01/08/2011   1:14 PM           To: Barbaraann Barthel

## 2011-01-16 ENCOUNTER — Ambulatory Visit
Admission: RE | Admit: 2011-01-16 | Discharge: 2011-01-16 | Disposition: A | Discharge status: Home / Self Care | Payer: Self-pay | Source: Ambulatory Visit | Attending: Family Medicine | Admitting: Family Medicine

## 2011-01-16 ENCOUNTER — Encounter: Payer: Self-pay | Admitting: Family Medicine

## 2011-01-16 DIAGNOSIS — R928 Other abnormal and inconclusive findings on diagnostic imaging of breast: Secondary | ICD-10-CM

## 2011-01-21 ENCOUNTER — Other Ambulatory Visit: Payer: Self-pay | Admitting: Family Medicine

## 2011-01-21 MED ORDER — ESCITALOPRAM OXALATE 20 MG PO TABS: 20.0000 mg | ORAL_TABLET | Freq: Every day | ORAL | Status: DC

## 2011-01-24 ENCOUNTER — Ambulatory Visit: Payer: Self-pay | Admitting: Family Medicine

## 2011-01-28 ENCOUNTER — Encounter: Payer: Self-pay | Admitting: Family Medicine

## 2011-01-28 ENCOUNTER — Ambulatory Visit (INDEPENDENT_AMBULATORY_CARE_PROVIDER_SITE_OTHER): Payer: Self-pay | Admitting: Family Medicine

## 2011-01-28 ENCOUNTER — Ambulatory Visit: Payer: Self-pay | Admitting: Family Medicine

## 2011-01-28 VITALS — BP 166/85 | HR 60 | Temp 98.0°F | Ht 60.0 in | Wt 139.0 lb

## 2011-01-28 DIAGNOSIS — F329 Major depressive disorder, single episode, unspecified: Secondary | ICD-10-CM

## 2011-01-28 DIAGNOSIS — R413 Other amnesia: Secondary | ICD-10-CM

## 2011-01-28 DIAGNOSIS — M545 Low back pain, unspecified: Secondary | ICD-10-CM

## 2011-01-28 DIAGNOSIS — I10 Essential (primary) hypertension: Secondary | ICD-10-CM

## 2011-01-28 DIAGNOSIS — F3289 Other specified depressive episodes: Secondary | ICD-10-CM

## 2011-01-28 DIAGNOSIS — R079 Chest pain, unspecified: Secondary | ICD-10-CM

## 2011-01-28 LAB — BASIC METABOLIC PANEL
CO2: 29 mEq/L (ref 19–32)
Calcium: 9.9 mg/dL (ref 8.4–10.5)
Chloride: 104 mEq/L (ref 96–112)
Creat: 0.64 mg/dL (ref 0.50–1.10)
Glucose, Bld: 88 mg/dL (ref 70–99)
Sodium: 141 mEq/L (ref 135–145)

## 2011-01-28 LAB — RPR

## 2011-01-28 NOTE — Patient Instructions (Signed)
Please increase your Celexa to 1 and a half tablets a day (total of 60 mg) Please change the neurotinto 400 mg (one half tablet) twice a day. We will make a cardiology referral.  If you have worse chest pain, if it comes more often, then please go to the ER. We will check your labs for your memory.  I will call you if anything shows up on those.   Please stop the oxycontin.

## 2011-01-29 ENCOUNTER — Encounter: Payer: Self-pay | Admitting: Family Medicine

## 2011-01-29 ENCOUNTER — Telehealth: Payer: Self-pay | Admitting: Family Medicine

## 2011-01-29 DIAGNOSIS — R0789 Other chest pain: Secondary | ICD-10-CM | POA: Insufficient documentation

## 2011-01-29 DIAGNOSIS — R413 Other amnesia: Secondary | ICD-10-CM | POA: Insufficient documentation

## 2011-01-29 NOTE — Assessment & Plan Note (Signed)
Pt with current exacerbation of depression.  Would like to increase her Celexa, but just received notice from [facility] that they no longer have Celexa, but do have escitalopram.  Will change to this med.  Follow up 2 weeks.  No SI/HI

## 2011-01-29 NOTE — Assessment & Plan Note (Signed)
Will adjust to take gabapentin BID instead of daily, as this helped her pain and the smaller doses do not seem to cause as many side effects.  Pt to contact us if no relief.

## 2011-01-29 NOTE — Progress Notes (Signed)
  Subjective:    [name], female    DOB: 06-Dec-1960, 50 y.o.   MRN: [id number]  HPI 50 yo pt of Dr. Rivka Safer who has been treated for chronic back pain presents with several conerns: Back pain- occurs in throracic region, like pins and needles, stabbing.  Helps if she lies on R side and puts pillow bet her legs.  This has improved with neurotin, but she feels funny with it.  No relief with Oxycontin, but did have itching all over when she took that.  Denies bowel incontinence.  Does have both urge and stress urinary incontinence - stable.  Medication intolerance - neurontin helps back pain, but when takes 800 mg a day, she wakes up the next day "feeling scared in my body".  Only happens when she takes neurontin.  She tried 1/2 tablet and felt better than with 800 mg  Chest pain- reports pain above L breast, radiates to neck and down L arm.  Occurs when stressed and sometimes with exertion. Relieved with rest and attempts to calm self and with Xanax. Occurs few times a week, but twice yesterday. No sweating or SOB.  Has had negative cardiac work up in past with stress tests.  Risk factors: + smoker, +FH (father died MI age 25, mother with MI/CVA in 58s), hypertension.    Memory problems - trouble remembering things.  Concerned she may be getting Alzheimers disease.  Depression - currently on Celexa with some relief, but feels depression is returning.  She has a fair amount of stress with finances and medical situation, reports feeling sad and frequent crying spells.     Review of Systems See HPI     Objective:   Physical Exam  Constitutional: She appears well-developed and well-nourished. No distress.  Eyes: Conjunctivae are normal. Right eye exhibits no discharge. Left eye exhibits no discharge. No scleral icterus.  Cardiovascular: Normal rate and regular rhythm.  Exam reveals no gallop and no friction rub.   Murmur heard.      1/6 systolic murmur  Pulmonary/Chest: Effort  normal and breath sounds normal. No respiratory distress. She has no wheezes. She has no rales.  Musculoskeletal:       Back TTP throacic spine.  No erythema/warmth.  Decreased ROM with antalgic posture -leans to side.  Skin: She is not diaphoretic.  Psychiatric:       Nl grooming and dress.  Sad affect with frequent crying spells.  Interactive and cooperative.  Nl thought process and content.  No FOI or LOA.  Occ loses train of thought, but recovers rapidly.  Denies SI/HI          Assessment & Plan:

## 2011-01-29 NOTE — Telephone Encounter (Signed)
I spoke with Jess Barters, pt's lawyer about the request for information.  She states that Ms. Waltman will probably have a hearing in about 4-5 months, and they will need an update of records closer to that date.  Nothing is needed at this time.

## 2011-01-29 NOTE — Assessment & Plan Note (Signed)
Poor control, but currently feeling very stressed.  Will check labs and try to get her meds for her - states [facility] does not have lisinopril or HCTZ. Could get from [facility] if [facility] does not have, or will try to change to something available from [facility].

## 2011-01-29 NOTE — Progress Notes (Signed)
Addended by: [place], Emmajo Bennette T on: 01/29/2011 10:39 AM   Modules accepted: Orders

## 2011-01-31 ENCOUNTER — Telehealth: Payer: Self-pay | Admitting: *Deleted

## 2011-01-31 NOTE — Telephone Encounter (Signed)
Spoke with patient and informed her of cardiology referral that wast set up. [facility] [address] phone number is [phone]. Patient informed of this and agreed. Patient also understands to call at least 24 hours in advance if to cancel

## 2011-02-04 ENCOUNTER — Emergency Department: Payer: Self-pay | Admitting: Emergency Medicine

## 2011-02-04 ENCOUNTER — Encounter: Payer: Self-pay | Admitting: Cardiology

## 2011-02-04 ENCOUNTER — Ambulatory Visit: Payer: Self-pay | Admitting: Family Medicine

## 2011-02-07 ENCOUNTER — Ambulatory Visit (INDEPENDENT_AMBULATORY_CARE_PROVIDER_SITE_OTHER): Payer: Self-pay | Admitting: Cardiology

## 2011-02-07 ENCOUNTER — Encounter: Payer: Self-pay | Admitting: Cardiology

## 2011-02-07 DIAGNOSIS — R079 Chest pain, unspecified: Secondary | ICD-10-CM

## 2011-02-07 DIAGNOSIS — Z720 Tobacco use: Secondary | ICD-10-CM | POA: Insufficient documentation

## 2011-02-07 DIAGNOSIS — F17200 Nicotine dependence, unspecified, uncomplicated: Secondary | ICD-10-CM

## 2011-02-07 DIAGNOSIS — E785 Hyperlipidemia, unspecified: Secondary | ICD-10-CM | POA: Insufficient documentation

## 2011-02-07 DIAGNOSIS — I10 Essential (primary) hypertension: Secondary | ICD-10-CM

## 2011-02-07 NOTE — Assessment & Plan Note (Signed)
Her LDL in May of last year was 138. Her risk factors a goal should be less than 100. I will repeat a lipid profile when she comes back for a stress test.

## 2011-02-07 NOTE — Assessment & Plan Note (Signed)
Chest pain is somewhat atypical. She would need stress testing but wouldn't be able on a treadmill. Therefore, she will have a [employer].

## 2011-02-07 NOTE — Assessment & Plan Note (Signed)
We discussed the need to stop smoking but she is not ready for this.

## 2011-02-07 NOTE — Assessment & Plan Note (Signed)
The blood pressure is at target. No change in medications is indicated. We will continue with therapeutic lifestyle changes (TLC).  

## 2011-02-07 NOTE — Patient Instructions (Addendum)
Your physician has requested that you have a lexiscan myoview. For further information please visit [email or web address] Please follow instruction sheet, as given.  Please continue your current medications.
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
## 2011-02-11 ENCOUNTER — Encounter: Payer: Self-pay | Admitting: Family Medicine

## 2011-02-11 ENCOUNTER — Ambulatory Visit (INDEPENDENT_AMBULATORY_CARE_PROVIDER_SITE_OTHER): Payer: Self-pay | Admitting: Family Medicine

## 2011-02-11 ENCOUNTER — Encounter: Payer: Self-pay | Admitting: Cardiology

## 2011-02-11 VITALS — BP 185/87 | HR 77 | Temp 97.8°F | Ht 60.0 in | Wt 139.2 lb

## 2011-02-11 DIAGNOSIS — M545 Low back pain, unspecified: Secondary | ICD-10-CM

## 2011-02-11 DIAGNOSIS — Z720 Tobacco use: Secondary | ICD-10-CM

## 2011-02-11 DIAGNOSIS — I10 Essential (primary) hypertension: Secondary | ICD-10-CM

## 2011-02-11 DIAGNOSIS — R55 Syncope and collapse: Secondary | ICD-10-CM

## 2011-02-11 DIAGNOSIS — F17200 Nicotine dependence, unspecified, uncomplicated: Secondary | ICD-10-CM

## 2011-02-11 DIAGNOSIS — F411 Generalized anxiety disorder: Secondary | ICD-10-CM

## 2011-02-11 MED ORDER — MELOXICAM 7.5 MG PO TABS: 7.5000 mg | ORAL_TABLET | Freq: Every day | ORAL | Status: DC

## 2011-02-11 NOTE — Patient Instructions (Signed)
I think your physical symptoms will get better when we get you some help with the stress in your life. We will refill the Mobic. You can try calling [facility] [place] [phone].  They have all sorts of counseling services.  Let me know if you need some more phone numbers.

## 2011-02-12 ENCOUNTER — Encounter: Payer: Self-pay | Admitting: Family Medicine

## 2011-02-12 DIAGNOSIS — R55 Syncope and collapse: Secondary | ICD-10-CM | POA: Insufficient documentation

## 2011-02-12 NOTE — Assessment & Plan Note (Signed)
Still smoking - feels it helps manage her stress and anxiety.  Would like to quit, has cut down.  Not ready to talk further about quitting.

## 2011-02-12 NOTE — Assessment & Plan Note (Signed)
Elevated today, but not taking meds.  Was within range when seen by cards and on meds.  Will restart meds today.  Follow up 2-3 weeks.

## 2011-02-12 NOTE — Progress Notes (Signed)
  Subjective:    [name], female    DOB: 11-17-1960, 50 y.o.   MRN: [id number]  HPI  50 yo with chronic back, leg pain here for follow up.  Recent syncopal episode- evaluated by cards.  Has stress test pending this week.  Records reviewed.  Pt stopped all meds except SSRI thinking this may contribute. Pt reports feeling of being scared in her body, having episodes where she feels she is shaking inside, an then her body feels sore afterwards.  These episodes awake her at night and are the worst first thing in the morning.  She is currently taking her Celexa, but it will be switched to Lexapro per the health dept pharmacy when she runs out.  This has helped a great deal with her mood.   She has a lot of stress in her life - currently living with her mother. Trying to help care for her ex-in laws.  Has applied for disability.  Still with pain.  Mother currently hospitalized for COPD and her heart not pumping right.  Pt also coping with childhood trauma that she has not discussed with anyone except her first husband.           Review of Systems See HPI     Objective:   Physical Exam  Nursing note and vitals reviewed. Constitutional: She appears well-developed and well-nourished.  Skin: She is not diaphoretic.  Psychiatric:       Pleasant and interactive.  Very tearful during visit, especially when discussing previous trauma and current life stresses.  Appears anxious.  Normal thought content and process without FOI or LOA.  Denies active SI,but feels that if something happened, it would be ok.  Denies HI.            Assessment & Plan:

## 2011-02-12 NOTE — Assessment & Plan Note (Signed)
Pt with anxiety disorder and multiple external stressors.  Currently on SSRI, does not feel well on alprazolam.  Suggested therapy, and pt agrees.  Given resources for contacting therapist.  Contracts for safety.  Follow up 2-3 weeks.

## 2011-02-12 NOTE — Assessment & Plan Note (Signed)
Evaluated by cards.  Stress test pending.

## 2011-02-13 ENCOUNTER — Other Ambulatory Visit: Payer: Self-pay | Admitting: Cardiology

## 2011-02-13 ENCOUNTER — Ambulatory Visit (HOSPITAL_COMMUNITY): Payer: Self-pay | Attending: Cardiology | Admitting: Radiology

## 2011-02-13 ENCOUNTER — Other Ambulatory Visit (INDEPENDENT_AMBULATORY_CARE_PROVIDER_SITE_OTHER): Payer: Self-pay | Admitting: *Deleted

## 2011-02-13 DIAGNOSIS — R0989 Other specified symptoms and signs involving the circulatory and respiratory systems: Secondary | ICD-10-CM

## 2011-02-13 DIAGNOSIS — R0789 Other chest pain: Secondary | ICD-10-CM

## 2011-02-13 DIAGNOSIS — R55 Syncope and collapse: Secondary | ICD-10-CM

## 2011-02-13 DIAGNOSIS — E7800 Pure hypercholesterolemia, unspecified: Secondary | ICD-10-CM

## 2011-02-13 DIAGNOSIS — Z79899 Other long term (current) drug therapy: Secondary | ICD-10-CM

## 2011-02-13 DIAGNOSIS — E785 Hyperlipidemia, unspecified: Secondary | ICD-10-CM

## 2011-02-13 DIAGNOSIS — R079 Chest pain, unspecified: Secondary | ICD-10-CM | POA: Insufficient documentation

## 2011-02-13 LAB — LIPID PANEL
HDL: 45 mg/dL (ref >39.00)
Total CHOL/HDL Ratio: 5
Triglycerides: 163 mg/dL — ABNORMAL HIGH (ref 0.0–149.0)

## 2011-02-13 LAB — LDL CHOLESTEROL, DIRECT: Direct LDL: 149.4 mg/dL

## 2011-02-13 MED ORDER — REGADENOSON 0.4 MG/5ML IV SOLN
0.4000 mg | Freq: Once | INTRAVENOUS | Status: AC
  Administered 2011-02-13: 0.4 mg via INTRAVENOUS

## 2011-02-13 MED ORDER — TECHNETIUM TC 99M TETROFOSMIN IV KIT
11.0000 | PACK | Freq: Once | INTRAVENOUS | Status: AC | PRN
  Administered 2011-02-13: 11 via INTRAVENOUS

## 2011-02-13 MED ORDER — TECHNETIUM TC 99M TETROFOSMIN IV KIT
33.0000 | PACK | Freq: Once | INTRAVENOUS | Status: AC | PRN
  Administered 2011-02-13: 33 via INTRAVENOUS

## 2011-02-13 NOTE — Progress Notes (Addendum)
[facility] SITE 3 NUCLEAR MED [address] [place] [address] [phone]  Cardiology Nuclear Med Study  [name] is a 50 y.o. female [id number] [birth year]   Nuclear Med Background Indication for Stress Test:  Evaluation for Ischemia and Post Hospital 02/04/11 [id number] and Chest Pain History:  '07 Echo:Normal LVF; '07 MPS:No ischemia, EF=60% Cardiac Risk Factors: Family History - CAD, Hypertension, Lipids, Smoker and TIA  Symptoms:  Chest Pressure>Neck.  (last episode of discomfort is now, 2-3/10), Diaphoresis, Dizziness, DOE, Fatigue, Nausea, Palpitations and Syncope (02/04/11)   Nuclear Pre-Procedure Caffeine/Decaff Intake:  None NPO After: 8:30pm   Lungs:  Clear.  O2 sat 97% on RA. IV 0.9% NS with Angio Cath:  22g  IV Site: R Antecubital  IV Started by:  Irean Hong, RN  Chest Size (in):  38 Cup Size: C  Height: 5' (1.524 m)  Weight:  135 lb (61.236 kg)  BMI:  Body mass index is 26.37 kg/(m^2). Tech Comments:  n/a    Nuclear Med Study 1 or 2 day study: 1 day  Stress Test Type:  Lexiscan  Reading MD: Kristeen Miss, MD  Order Authorizing Provider:  Rollene Rotunda, MD  Resting Radionuclide: Technetium 20m Tetrofosmin  Resting Radionuclide Dose: 11.0 mCi   Stress Radionuclide:  Technetium 65m Tetrofosmin  Stress Radionuclide Dose: 33.0 mCi           Stress Protocol Rest HR: 66 Stress HR: 94  Rest BP: 121/77 Stress BP: 162/73  Exercise Time (min): n/a METS: n/a   Predicted Max HR: 169 bpm % Max HR: 56.8 bpm Rate Pressure Product: 69629   Dose of Adenosine (mg):  n/a Dose of Lexiscan: 0.4 mg  Dose of Atropine (mg): n/a Dose of Dobutamine: n/a mcg/kg/min (at max HR)  Stress Test Technologist: Smiley Houseman, CMA-N  Nuclear Technologist:  Domenic Polite, CNMT     Rest Procedure:  Myocardial perfusion imaging was performed at rest 45 minutes following the intravenous administration of Technetium 47m Tetrofosmin.  Rest ECG: No acute  chnages.  Stress Procedure:  The patient received IV Lexiscan 0.4 mg over 15-seconds.  Technetium 64m Tetrofosmin injected at 30-seconds.  There were no significant changes with Lexiscan.  Quantitative spect images were obtained after a 45 minute delay.  Stress ECG: No significant change from baseline ECG and No significant ST segment change suggestive of ischemia.  QPS Raw Data Images:  There is no interference from nuclear activity from structures below the diaphragm.  This does not affect the ability to read the study. Stress Images:  Mild apical thinng but otherwise normal uptake in all areas of the LV. Rest Images:  Mild apical thinng but otherwise normal uptake in all areas of the LV. Subtraction (SDS):  Normal Transient Ischemic Dilatation (Normal <1.22):  1.07 Lung/Heart Ratio (Normal <0.45):  0.27  Quantitative Gated Spect Images QGS EDV:  71 ml QGS ESV:  27 ml QGS cine images:  NL LV Function; NL Wall Motion QGS EF: 61%  Impression Exercise Capacity:  Lexiscan with no exercise. BP Response:  Normal blood pressure response. Clinical Symptoms:  No chest pain. ECG Impression:  No significant ST segment change suggestive of ischemia. Comparison with Prior Nuclear Study: No images to compare  Overall Impression:  Normal stress nuclear study.  No evidence of ischemia.  Normal LV function.   Elyn Aquas., MD, [facility]  No evidence of ischemia or infarct.  I will continue with primary risk reduction.  Rollene Rotunda

## 2011-02-14 NOTE — Progress Notes (Signed)
nuc med report routed to Dr Antoine Poche 02/14/11 [name]

## 2011-02-18 MED ORDER — ATORVASTATIN CALCIUM 20 MG PO TABS: 20.0000 mg | ORAL_TABLET | Freq: Every day | ORAL | Status: DC

## 2011-02-18 NOTE — Progress Notes (Signed)
Pt aware of results of nuc study 

## 2011-02-18 NOTE — Progress Notes (Signed)
Addended by: Sharin Grave on: 02/18/2011 05:13 PM   Modules accepted: Orders

## 2011-02-18 NOTE — Progress Notes (Signed)
Addended by: Sharin Grave on: 02/18/2011 05:25 PM   Modules accepted: Orders

## 2011-02-18 NOTE — Progress Notes (Signed)
Addended by: Sharin Grave on: 02/18/2011 05:23 PM   Modules accepted: Orders

## 2011-02-25 ENCOUNTER — Other Ambulatory Visit: Payer: Self-pay | Admitting: Family Medicine

## 2011-02-25 MED ORDER — ESCITALOPRAM OXALATE 20 MG PO TABS: 20.0000 mg | ORAL_TABLET | Freq: Every day | ORAL | Status: DC

## 2011-03-11 ENCOUNTER — Ambulatory Visit: Payer: Self-pay | Admitting: Family Medicine

## 2011-03-14 ENCOUNTER — Encounter: Payer: Self-pay | Admitting: Family Medicine

## 2011-03-14 ENCOUNTER — Ambulatory Visit (INDEPENDENT_AMBULATORY_CARE_PROVIDER_SITE_OTHER): Payer: Self-pay | Admitting: Family Medicine

## 2011-03-14 DIAGNOSIS — M5430 Sciatica, unspecified side: Secondary | ICD-10-CM

## 2011-03-14 DIAGNOSIS — E7800 Pure hypercholesterolemia, unspecified: Secondary | ICD-10-CM

## 2011-03-14 MED ORDER — ASPIRIN 81 MG PO TABS: 81.0000 mg | ORAL_TABLET | Freq: Every day | ORAL | Status: DC

## 2011-03-14 MED ORDER — OXYCODONE-ACETAMINOPHEN 5-325 MG PO TABS: 1.0000 | ORAL_TABLET | Freq: Two times a day (BID) | ORAL | Status: AC

## 2011-03-14 MED ORDER — OMEGA-3 FATTY ACIDS 1000 MG PO CAPS: 2.0000 g | ORAL_CAPSULE | Freq: Every day | ORAL | Status: AC

## 2011-03-14 MED ORDER — ALPRAZOLAM 0.5 MG PO TABS: 0.5000 mg | ORAL_TABLET | Freq: Every evening | ORAL | Status: DC | PRN

## 2011-03-14 MED ORDER — GABAPENTIN 800 MG PO TABS: 400.0000 mg | ORAL_TABLET | Freq: Every day | ORAL | Status: DC

## 2011-03-14 MED ORDER — ATORVASTATIN CALCIUM 20 MG PO TABS: 20.0000 mg | ORAL_TABLET | Freq: Every day | ORAL | Status: DC

## 2011-03-14 NOTE — Patient Instructions (Signed)
It was great seeing you [name].  Send me what needs to be refilled and your handicap form.  Please come back and see me in 3 months or sooner.

## 2011-03-14 NOTE — Progress Notes (Signed)
  Subjective:    [name], female    DOB: October 28, 1960, 50 y.o.   MRN: [id number]  HPI 1. Chronic back and side pain Patient has been on several narcotic and non-narcotic therapeutic regimens. She continues to have pain. She has relief from the Neurontin/mobic. She did not have relief from the oxycontin prescribed and this caused itching. She has used Percocet in the past with relief of her symptoms.    2. HTN Blood pressure well controlled today. 140/60. Her Bun/Cr was normal.  3. Hypercholesterolemia She has been started on Lipitor by a physician when she went to the ED for syncope. Her LDL/Choles/TG were elevated.   4. Anxiety She has been using xanax 1 mg QD effectively.   Review of Systems  Eyes: Negative for visual disturbance.  Cardiovascular: Negative for chest pain and palpitations.  Musculoskeletal: Positive for back pain.  Neurological: Negative for headaches.  Psychiatric/Behavioral: The patient is nervous/anxious.        Objective:   Physical Exam  Constitutional: She appears well-developed and well-nourished. No distress.  Cardiovascular: Normal rate, regular rhythm and normal heart sounds.   No murmur heard. Pulmonary/Chest: Effort normal and breath sounds normal. No respiratory distress. She has no wheezes. She has no rales.  Skin: She is not diaphoretic.      Assessment & Plan:  1. Chronic back and side pain - Referral to Physical Therapy - patient is interested in rehabbing her back. - percocet 5-325 BID PRN - halved the Neurontin dose because of nightmares.  2. HTN - excellent control. Continue current regimen  3. Hypercholesterolemia - will recheck her panel in three months after she starts taking Lipitor. Counseled on diet modification although she eats fairly healthy.  4. Anxiety She has been using xanax 1 mg QD effectively.

## 2011-04-05 ENCOUNTER — Ambulatory Visit: Payer: Self-pay | Attending: Family Medicine | Admitting: Rehabilitation

## 2011-04-05 DIAGNOSIS — M545 Low back pain, unspecified: Secondary | ICD-10-CM | POA: Insufficient documentation

## 2011-04-05 DIAGNOSIS — M2569 Stiffness of other specified joint, not elsewhere classified: Secondary | ICD-10-CM | POA: Insufficient documentation

## 2011-04-05 DIAGNOSIS — IMO0001 Reserved for inherently not codable concepts without codable children: Secondary | ICD-10-CM | POA: Insufficient documentation

## 2011-04-09 ENCOUNTER — Encounter: Payer: Self-pay | Admitting: Physical Therapy

## 2011-04-12 ENCOUNTER — Ambulatory Visit (HOSPITAL_COMMUNITY)
Admission: RE | Admit: 2011-04-12 | Discharge: 2011-04-12 | Disposition: A | Discharge status: Home / Self Care | Payer: Self-pay | Source: Ambulatory Visit | Attending: Family Medicine | Admitting: Family Medicine

## 2011-04-12 ENCOUNTER — Encounter: Payer: Self-pay | Admitting: Family Medicine

## 2011-04-12 ENCOUNTER — Encounter: Payer: Self-pay | Admitting: Physical Therapy

## 2011-04-12 ENCOUNTER — Ambulatory Visit (INDEPENDENT_AMBULATORY_CARE_PROVIDER_SITE_OTHER): Payer: Self-pay | Admitting: Family Medicine

## 2011-04-12 VITALS — BP 198/79 | HR 54 | Temp 98.0°F | Ht 64.0 in | Wt 137.4 lb

## 2011-04-12 DIAGNOSIS — I639 Cerebral infarction, unspecified: Secondary | ICD-10-CM | POA: Insufficient documentation

## 2011-04-12 DIAGNOSIS — R209 Unspecified disturbances of skin sensation: Secondary | ICD-10-CM

## 2011-04-12 DIAGNOSIS — R29810 Facial weakness: Secondary | ICD-10-CM

## 2011-04-12 DIAGNOSIS — I10 Essential (primary) hypertension: Secondary | ICD-10-CM

## 2011-04-12 DIAGNOSIS — Z8673 Personal history of transient ischemic attack (TIA), and cerebral infarction without residual deficits: Secondary | ICD-10-CM | POA: Insufficient documentation

## 2011-04-12 DIAGNOSIS — E785 Hyperlipidemia, unspecified: Secondary | ICD-10-CM | POA: Insufficient documentation

## 2011-04-12 DIAGNOSIS — F17200 Nicotine dependence, unspecified, uncomplicated: Secondary | ICD-10-CM | POA: Insufficient documentation

## 2011-04-12 DIAGNOSIS — I6350 Cerebral infarction due to unspecified occlusion or stenosis of unspecified cerebral artery: Secondary | ICD-10-CM

## 2011-04-12 MED ORDER — HYDROCHLOROTHIAZIDE 12.5 MG PO CAPS: 25.0000 mg | ORAL_CAPSULE | Freq: Every day | ORAL | Status: DC

## 2011-04-12 MED ORDER — ASPIRIN 325 MG PO TABS: 325.0000 mg | ORAL_TABLET | Freq: Every day | ORAL | Status: DC

## 2011-04-12 MED ORDER — LISINOPRIL 5 MG PO TABS: 5.0000 mg | ORAL_TABLET | Freq: Every day | ORAL | Status: DC

## 2011-04-12 NOTE — Assessment & Plan Note (Signed)
Blood pressure is not controlled today. She is taking 12.5 of hydrochlorothiazide, Norvasc 10 mg and not taking lisinopril at all. We'll slowly decrease blood pressure by increasing hydrochlorothiazide to 25 mg a day and starting lisinopril at 5 mg a day. Patient will followup to clinic on Monday and we will adjust blood pressure is needed then.

## 2011-04-12 NOTE — Patient Instructions (Signed)
Thank you for coming in today. I would like you to start taking HCTZ 2 pills daily I also would like you to start taking 5mg  of lisinopril daily.  Come back Monday or Tuesday.  We will try to get those studies today or tomorrow.  If your symptoms change or worsen call or go to the ER.

## 2011-04-12 NOTE — Assessment & Plan Note (Signed)
I believe [name] has had suffered a stroke.  I strongly suspected to be a ischemic stroke and not hemorrhagic and she is improving. Plan to work up stroke with carotid Dopplers and a noncontrast head CT. We'll also control blood pressure is noted below. Also will increase aspirin to 325 daily. Patient will followup on Monday with red flag precautions.

## 2011-04-12 NOTE — Progress Notes (Signed)
[name] presents to clinic today with bright facial numbness and right arm weakness.  Her symptoms started abruptly Wednesday described as right facial numbness. She felt like she had had a lidocaine shot at the dentist. She denies any facial droop at that time and denies any change in speech or swallow. Her facial numbness has slowly improved. She says it feels a bit different compared to the other side.  He denies any shooting pains or pain in her face.  Right hand weakness. Started the same time as face numbness.  Her hand weakness has also improved slowly. She also notes a little bit of numbness. She is not sure if this is part of her degenerative cervical disc disease. Denies any clumsiness with her right hand.  PMH reviewed. She describes a personal history of mini strokes noted on brain imaging in the past. ROS as above otherwise neg  no chest pain palpitations Medications reviewed.  Exam:  BP 198/79  Pulse 54  Temp(Src) 98 F (36.7 C) (Oral)  Ht 5\' 4"  (1.626 m)  Wt 137 lb 6 oz (62.313 kg)  BMI 23.58 kg/m2 Gen: Well NAD, mildly anxious HEENT: EOMI,  MMM.  Lungs: CTABL Nl WOB Heart: RRR soft nonradiating systolic murmur best heard in the upper sternal borders Abd: NABS, NT, ND Exts: Non edematous BL  LE, warm and well perfused.  Neuro: Cranial nerves II through XII are intact. She can feel monofilament over her entire face however it feels a bit different on the right lower face compared to the left lower face. Sensation: She has loss of sensation to monofilament of her right pinky finger extending to the wrist. Otherwise her monofilament exam is normal. Strength: Intact throughout. Reflexes: Intact equal and symmetrical throughout. Gait: Normal.

## 2011-04-15 ENCOUNTER — Telehealth: Payer: Self-pay | Admitting: Family Medicine

## 2011-04-15 NOTE — Telephone Encounter (Signed)
Discussed test results.  Pt will call for appt this week.

## 2011-04-15 NOTE — Telephone Encounter (Signed)
Calling for results from hospitlalization from Friday.

## 2011-04-19 LAB — CARDIAC PANEL(CRET KIN+CKTOT+MB+TROPI)
CK, MB: 1.2
Relative Index: INVALID
Total CK: 60
Total CK: 61

## 2011-04-19 LAB — POCT CARDIAC MARKERS
CKMB, poc: 1.1
Myoglobin, poc: 54.6
Troponin i, poc: <0.05

## 2011-04-19 LAB — BASIC METABOLIC PANEL
BUN: 15
CO2: 29
Calcium: 8.6
Calcium: 8.5
Creatinine, Ser: 0.5
GFR calc Af Amer: >60
GFR calc non Af Amer: >60
GFR calc non Af Amer: >60
Glucose, Bld: 118 — ABNORMAL HIGH
Sodium: 138

## 2011-04-19 LAB — RAPID URINE DRUG SCREEN, HOSP PERFORMED
Amphetamines: NOT DETECTED
Barbiturates: NOT DETECTED

## 2011-04-19 LAB — PROTIME-INR
INR: 1
Prothrombin Time: 12.8

## 2011-04-19 LAB — URINALYSIS, ROUTINE W REFLEX MICROSCOPIC
Hgb urine dipstick: NEGATIVE
Nitrite: NEGATIVE
Protein, ur: NEGATIVE
Urobilinogen, UA: 0.2

## 2011-04-19 LAB — DIFFERENTIAL
Basophils Absolute: 0
Eosinophils Relative: 2
Lymphocytes Relative: 28
Lymphs Abs: 1.8
Neutro Abs: 4
Neutrophils Relative %: 62

## 2011-04-19 LAB — LIPID PANEL
Cholesterol: 172
LDL Cholesterol: 107 — ABNORMAL HIGH

## 2011-04-19 LAB — CBC
Hemoglobin: 11.8 — ABNORMAL LOW
Platelets: 188
RBC: 3.71 — ABNORMAL LOW
RDW: 13.7
WBC: 6.5

## 2011-04-19 LAB — HEMOGLOBIN A1C: Mean Plasma Glucose: 129

## 2011-04-19 LAB — TSH: TSH: 1.518

## 2011-04-19 LAB — CK TOTAL AND CKMB (NOT AT ARMC): Total CK: 74

## 2011-04-19 LAB — B-NATRIURETIC PEPTIDE (CONVERTED LAB): Pro B Natriuretic peptide (BNP): 225 — ABNORMAL HIGH

## 2011-04-22 ENCOUNTER — Other Ambulatory Visit: Payer: Self-pay | Admitting: Family Medicine

## 2011-04-22 ENCOUNTER — Ambulatory Visit: Payer: Self-pay | Admitting: Family Medicine

## 2011-04-22 MED ORDER — SERTRALINE HCL 50 MG PO TABS: 50.0000 mg | ORAL_TABLET | Freq: Every day | ORAL | Status: DC

## 2011-05-02 ENCOUNTER — Encounter: Payer: Self-pay | Admitting: Family Medicine

## 2011-05-02 ENCOUNTER — Ambulatory Visit (INDEPENDENT_AMBULATORY_CARE_PROVIDER_SITE_OTHER): Payer: Self-pay | Admitting: Family Medicine

## 2011-05-02 VITALS — BP 148/70 | HR 62 | Temp 97.8°F | Wt 135.7 lb

## 2011-05-02 DIAGNOSIS — F329 Major depressive disorder, single episode, unspecified: Secondary | ICD-10-CM

## 2011-05-02 DIAGNOSIS — F17200 Nicotine dependence, unspecified, uncomplicated: Secondary | ICD-10-CM

## 2011-05-02 DIAGNOSIS — Z720 Tobacco use: Secondary | ICD-10-CM

## 2011-05-02 DIAGNOSIS — L749 Eccrine sweat disorder, unspecified: Secondary | ICD-10-CM

## 2011-05-02 DIAGNOSIS — F411 Generalized anxiety disorder: Secondary | ICD-10-CM

## 2011-05-02 DIAGNOSIS — Z23 Encounter for immunization: Secondary | ICD-10-CM

## 2011-05-02 DIAGNOSIS — R61 Generalized hyperhidrosis: Secondary | ICD-10-CM | POA: Insufficient documentation

## 2011-05-02 DIAGNOSIS — R079 Chest pain, unspecified: Secondary | ICD-10-CM

## 2011-05-02 DIAGNOSIS — M25552 Pain in left hip: Secondary | ICD-10-CM

## 2011-05-02 DIAGNOSIS — M25559 Pain in unspecified hip: Secondary | ICD-10-CM

## 2011-05-02 DIAGNOSIS — L748 Other eccrine sweat disorders: Secondary | ICD-10-CM

## 2011-05-02 DIAGNOSIS — I10 Essential (primary) hypertension: Secondary | ICD-10-CM

## 2011-05-02 LAB — BASIC METABOLIC PANEL
Chloride: 102 mEq/L (ref 96–112)
Creat: 0.56 mg/dL (ref 0.50–1.10)
Potassium: 3.9 mEq/L (ref 3.5–5.3)

## 2011-05-02 MED ORDER — ALPRAZOLAM 0.5 MG PO TABS: 0.5000 mg | ORAL_TABLET | Freq: Three times a day (TID) | ORAL | Status: DC | PRN

## 2011-05-02 MED ORDER — QUINAPRIL-HYDROCHLOROTHIAZIDE 20-25 MG PO TABS: 1.0000 | ORAL_TABLET | Freq: Every day | ORAL | Status: DC

## 2011-05-02 MED ORDER — OXYCODONE-ACETAMINOPHEN 5-325 MG PO TABS: 1.0000 | ORAL_TABLET | Freq: Three times a day (TID) | ORAL | Status: AC | PRN

## 2011-05-02 NOTE — Assessment & Plan Note (Signed)
Therapy would be helpful, but pt not ready yet.  Continue alprazolam and SSRI.

## 2011-05-02 NOTE — Assessment & Plan Note (Signed)
Check hip films.  Ibuprofen with percocet for severe pain.

## 2011-05-02 NOTE — Assessment & Plan Note (Signed)
BP elevated, so will increase quinapril/HCTZ.  Check BMP today.  Follow up 4 weeks for repeat labs and pt will monitor her BP at home.

## 2011-05-02 NOTE — Assessment & Plan Note (Signed)
Pt quit.  Efforts supported.

## 2011-05-02 NOTE — Progress Notes (Signed)
  Subjective:    [name], female    DOB: 1961/02/03, 49 y.o.   MRN: [id number]  HPI 21 you female applying for disability here for several issues:  F/u possible CVA.  Taking asa 325 daily.  Quit smoking (2 weeks ago).  No new concerns.  Pressure in her trunk.  Feels pressure from her hips to head.  Not pain, but pressure.  Constant for 1.5 weeks.  No change with exertion, no SOB.  New exercises with PT, but not arm exercises.  Nothing makes it better or worse.  Has not tried meds for this.   Pain in L hip.  Has had for years.  Keeps her from walking long distances and requests disabled placard.  Has seen PT x 1, but unable to get back due to transportation issues.  Does these exercises at home.  Pain with flexing hip, in groin.  Takes percocet on occasion.  Worse when sleeps on it, when walking.  No h/o injury.  Cold sweats.  Wakes up cold and clammy.  + sweats during the day when hot.  No weight change, no diarrhea.  + constipation, no hempotysis.    Mood is ok.  +financial strain.  Taking celexa, but will change to citalopram when HD changes formulary. Xanax also helps.  Has not contacted therapist.  Still considering. Strong faith belief system, which she feels helps her.  No SI/HI.  Enjoys spending time with granddaughter.       Review of Systems  See HPI     Objective:   Physical Exam  Constitutional: She appears well-developed and well-nourished. No distress.  Cardiovascular: Normal rate, regular rhythm and normal heart sounds.  Exam reveals no gallop and no friction rub.   No murmur heard. Pulmonary/Chest: Effort normal and breath sounds normal. No respiratory distress. She has no wheezes. She has no rales. She exhibits tenderness.       TTP B costosternal border.  Musculoskeletal:       R hip without pain. L hip with FROM but pain with flexion, int/ext rotation.  Skin: She is not diaphoretic.  Psychiatric: She has a normal mood and affect. Her behavior is  normal. Judgment and thought content normal.          Assessment & Plan:

## 2011-05-02 NOTE — Assessment & Plan Note (Signed)
Likely costochondritis.  Will treat with OTC ibuprofen (pt has at home) and follow.

## 2011-05-02 NOTE — Patient Instructions (Signed)
It was great to see you today. Congratulations on quitting smoking! We will increase your blood pressure medicine (accuretic). You can take 2 pills daily until you run out, and then you can fill the new prescription. Please go by the radiology department of the hospital so we can check an x-ray of your hip. We will check some blood work today.

## 2011-05-02 NOTE — Assessment & Plan Note (Signed)
Stable.  Continue SSRI on formulary.  Therapy suggested.  +financial stressors.

## 2011-05-02 NOTE — Assessment & Plan Note (Signed)
No red flags for TB.  Perhaps menopausal.  Check TSH.

## 2011-05-13 ENCOUNTER — Encounter: Payer: Self-pay | Admitting: Family Medicine

## 2011-06-27 ENCOUNTER — Encounter: Payer: Self-pay | Admitting: Family Medicine

## 2011-06-27 ENCOUNTER — Ambulatory Visit (INDEPENDENT_AMBULATORY_CARE_PROVIDER_SITE_OTHER): Payer: Self-pay | Admitting: Family Medicine

## 2011-06-27 DIAGNOSIS — M25559 Pain in unspecified hip: Secondary | ICD-10-CM

## 2011-06-27 DIAGNOSIS — Z1211 Encounter for screening for malignant neoplasm of colon: Secondary | ICD-10-CM

## 2011-06-27 DIAGNOSIS — F329 Major depressive disorder, single episode, unspecified: Secondary | ICD-10-CM

## 2011-06-27 DIAGNOSIS — I10 Essential (primary) hypertension: Secondary | ICD-10-CM

## 2011-06-27 DIAGNOSIS — M25552 Pain in left hip: Secondary | ICD-10-CM

## 2011-06-27 MED ORDER — OXYCODONE-ACETAMINOPHEN 5-325 MG PO TABS: 1.0000 | ORAL_TABLET | Freq: Four times a day (QID) | ORAL | Status: AC | PRN

## 2011-06-27 MED ORDER — QUINAPRIL HCL 20 MG PO TABS: 20.0000 mg | ORAL_TABLET | Freq: Every day | ORAL | Status: DC

## 2011-06-27 NOTE — Patient Instructions (Signed)
We won't change your pain medicines right now.  I think getting the xrays is a good idea, and then we can see about long term pain management. Please add another 20 mg of quinapril a day.  You can take the prescription to the health department.   Please make an appointment for labs when you are fasting. Tomorrow would be great. Please come back and see me in 1 month so we can follow up on your pain and blood pressure. You can do the stool cards and send them back to [place] when they are ready.

## 2011-06-28 ENCOUNTER — Encounter: Payer: Self-pay | Admitting: Family Medicine

## 2011-06-28 ENCOUNTER — Other Ambulatory Visit: Payer: Self-pay

## 2011-06-28 NOTE — Assessment & Plan Note (Signed)
Pt ed on getting xrays.  She plans to get them tomorrow.

## 2011-06-28 NOTE — Assessment & Plan Note (Signed)
Stable.  Declines CBT.  Continue with sertraline.

## 2011-06-28 NOTE — Assessment & Plan Note (Signed)
Not at goal.  Will add an additional 20 mg of quinapril.  Check CMP, lipid profile.  Follow up 1 month.

## 2011-06-28 NOTE — Progress Notes (Signed)
  Subjective:    [name], female    DOB: 1961-06-04, 50 y.o.   MRN: [id number]  HPI 50 yo female here to follow up on several issues. Requests refills of pain meds and Zoloft.  Feels down about her financial situation, especially at Christmas, but otherwise ok.  Denies SI/HI.  Declineds therapy.   Pain in hip continues.  Never got x-rays because she did not realize she could drop in for them.  Pain is on L side, worse when standing, walking, but hurts all the time.  It is 10-11/10 at worst, but now 7/10.  Meds take the edge off.  Tries to distract herself from the pain.  Does therapy at home, stretches, which may help.  Pt also reports night sweats, but also more intense spells where she feels nausea, feels weak and breaks out into a drenching sweat.  This has occurred 3 times in 3 months.  Uses Xanax to help her sleep, but also started using Sleep Aid from [facility], which also helps.     Review of Systems No smoking, no chest pain, no SOB.       Objective:   Physical Exam  Nursing note and vitals reviewed. Constitutional: She appears well-developed and well-nourished. No distress.  Cardiovascular: Normal rate, regular rhythm, normal heart sounds and intact distal pulses.  Exam reveals no friction rub.   No murmur heard. Pulmonary/Chest: Effort normal and breath sounds normal. No respiratory distress. She has no wheezes. She has no rales.  Musculoskeletal:       Pain with flexion and ext rotation L hip.  Skin: She is not diaphoretic.  Psychiatric: She has a normal mood and affect. Her behavior is normal. Judgment and thought content normal.          Assessment & Plan:

## 2011-07-01 ENCOUNTER — Ambulatory Visit (HOSPITAL_COMMUNITY)
Admission: RE | Admit: 2011-07-01 | Discharge: 2011-07-01 | Disposition: A | Discharge status: Home / Self Care | Payer: Self-pay | Source: Ambulatory Visit | Attending: Family Medicine | Admitting: Family Medicine

## 2011-07-01 DIAGNOSIS — M25659 Stiffness of unspecified hip, not elsewhere classified: Secondary | ICD-10-CM | POA: Insufficient documentation

## 2011-07-01 DIAGNOSIS — R1031 Right lower quadrant pain: Secondary | ICD-10-CM | POA: Insufficient documentation

## 2011-07-01 DIAGNOSIS — M25559 Pain in unspecified hip: Secondary | ICD-10-CM | POA: Insufficient documentation

## 2011-07-01 DIAGNOSIS — M25552 Pain in left hip: Secondary | ICD-10-CM

## 2011-07-10 ENCOUNTER — Other Ambulatory Visit: Payer: Self-pay | Admitting: Family Medicine

## 2011-07-10 MED ORDER — CITALOPRAM HYDROBROMIDE 40 MG PO TABS: 40.0000 mg | ORAL_TABLET | Freq: Every day | ORAL | Status: DC

## 2011-07-10 NOTE — Telephone Encounter (Signed)
Pt had rx sent to the health dept for zoloft, has been waiting for the meds to come in but they still haven't and shes needing to start meds, wants to know if MD can send to [facility] until the health dept gets them in, pt says she has a family member that is going to pay for them.

## 2011-07-10 NOTE — Telephone Encounter (Signed)
FWD to PCP patient notified

## 2011-08-05 ENCOUNTER — Ambulatory Visit (INDEPENDENT_AMBULATORY_CARE_PROVIDER_SITE_OTHER): Payer: Self-pay | Admitting: Family Medicine

## 2011-08-05 DIAGNOSIS — I10 Essential (primary) hypertension: Secondary | ICD-10-CM

## 2011-08-05 MED ORDER — QUINAPRIL-HYDROCHLOROTHIAZIDE 20-25 MG PO TABS: 1.0000 | ORAL_TABLET | Freq: Every day | ORAL | Status: DC

## 2011-08-05 NOTE — Progress Notes (Signed)
[name], female   DOB: 1961-06-11, 51 y.o.   MRN: [id number] Pt cancelled appt.  This encounter is for med refill only.

## 2011-08-12 ENCOUNTER — Ambulatory Visit: Payer: Self-pay | Admitting: Family Medicine

## 2011-08-19 ENCOUNTER — Ambulatory Visit (INDEPENDENT_AMBULATORY_CARE_PROVIDER_SITE_OTHER): Payer: Self-pay | Admitting: Family Medicine

## 2011-08-19 ENCOUNTER — Encounter: Payer: Self-pay | Admitting: Family Medicine

## 2011-08-19 VITALS — BP 157/75 | HR 61 | Temp 97.3°F | Ht 60.0 in | Wt 135.0 lb

## 2011-08-19 DIAGNOSIS — I10 Essential (primary) hypertension: Secondary | ICD-10-CM

## 2011-08-19 DIAGNOSIS — M549 Dorsalgia, unspecified: Secondary | ICD-10-CM

## 2011-08-19 DIAGNOSIS — Z23 Encounter for immunization: Secondary | ICD-10-CM

## 2011-08-19 DIAGNOSIS — M79605 Pain in left leg: Secondary | ICD-10-CM

## 2011-08-19 DIAGNOSIS — M545 Low back pain: Secondary | ICD-10-CM

## 2011-08-19 LAB — BASIC METABOLIC PANEL
BUN: 17 mg/dL (ref 6–23)
Calcium: 9.5 mg/dL (ref 8.4–10.5)
Creat: 0.59 mg/dL (ref 0.50–1.10)
Glucose, Bld: 103 mg/dL — ABNORMAL HIGH (ref 70–99)

## 2011-08-19 MED ORDER — NAPROXEN 500 MG PO TABS: 500.0000 mg | ORAL_TABLET | Freq: Two times a day (BID) | ORAL | Status: DC

## 2011-08-19 NOTE — Patient Instructions (Signed)
We will try naproxen for better pain control. I will order the MRI for further evaluation. Please get the x ray for your back at the hospital. We will check your blood today for the electrolytes. Please add the accupril (quinapril) to your blood pressure medicines.  You should take 20 mg daily on top of your accuretic. I would ask your lawyer if she can suggest a disability doctor for the evaluation.  If that does not work, let me know.

## 2011-08-20 DIAGNOSIS — M545 Low back pain: Secondary | ICD-10-CM | POA: Insufficient documentation

## 2011-08-20 NOTE — Assessment & Plan Note (Addendum)
Patient advised to pick up the extra quinapril and added to her regimen. She is not at goal today. She is high risk given her h/o of a cva, and we need to improve her blood pressure.  Check BMP today.

## 2011-08-20 NOTE — Assessment & Plan Note (Signed)
Patient with worsening pain not relieved by current pain regimen. Also now with some urinary incontinence, that is not stress or urge incontinence but seems to be more overflow incontinence. MRI is reviewed today. Given her new symptoms of incontinence and for worsening pain will check plain films of her back also order a thoracic and lumbar MRIs to evaluate more fully. red flags reviewed. Percocets are not helping will try anti-inflammatory agents such as Naprosyn for pain. Patient was advised to talk to her lawyer about needing a disability doctor who can more thoroughly do a disability exam. If no one is available for this and I will be happy to do it.

## 2011-08-20 NOTE — Progress Notes (Signed)
  Subjective:    [name], female    DOB: [birth year], 51 y.o.   MRN: [id number]  HPI patient is a 51 year old female here for followup of several issues. #1 pain. Patient reports she's continuing to have pain in her back .8/10 it feels like a knife sticking in the middle of her back. She has 10 out of 10 pain in her toes. This is like a burning pain. She has fallen 3 times with a sensation of her left leg giving out since  her last visit. She is taking 2 Percocets at a time without relief. She also takes ibuprofen once in a while without much relief. She is doing exercises that physical therapy taught her. She denies saddle anesthesia. She denies bowel incontinence. She has noticed some urinary incontinence slight dribbling for the past few weeks requiring the use of a small pad. She feels like her pain is coming from her hip and radiating down her leg and feels a pain in her toes like pins.  The pain in her toes is at the tip of her great toe, the cuticle of the toes next to the big toe,  but her little toe is fine. She is very uncomfortable at night and she sleeps on the couch to try to help. #2 she has a disability evaluation on February 26. She would like a letter from [place] for this. She has been seen by disability doctors in the past. She is working with a Clinical research associate for disability.  Patient reports that she has not added in the other dose of quinapril for her blood pressure. she's had a Pap about a year ago and her last menses was about a year ago she sees Dr. Nicholas Lose for gyn.  Alll paps normal in the past. w She reports she did do Hemoccult cards and mailed  them back to the family practice center. she confirms that her name was on the cards    Review of Systems see history of present illness     Objective:   Physical Exam  Constitutional: She appears well-developed and well-nourished. No distress.       Appears uncomfortable, no other distress.  Musculoskeletal:       Back  with spinal TTP thoracic region.  No erythema or warmth.   L leg with strength 4/5 at knee extension/flexion and hip flexion.  DTRs 4+ B patellar.  No clonus.   Antalgic gait  Skin: She is not diaphoretic.          Assessment & Plan:  See prob list

## 2011-08-22 ENCOUNTER — Ambulatory Visit (HOSPITAL_COMMUNITY)
Admission: RE | Admit: 2011-08-22 | Discharge: 2011-08-22 | Disposition: A | Discharge status: Home / Self Care | Payer: Self-pay | Source: Ambulatory Visit | Attending: Family Medicine | Admitting: Family Medicine

## 2011-08-22 DIAGNOSIS — M47817 Spondylosis without myelopathy or radiculopathy, lumbosacral region: Secondary | ICD-10-CM | POA: Insufficient documentation

## 2011-08-22 DIAGNOSIS — M549 Dorsalgia, unspecified: Secondary | ICD-10-CM | POA: Insufficient documentation

## 2011-08-27 ENCOUNTER — Telehealth: Payer: Self-pay | Admitting: Family Medicine

## 2011-08-27 NOTE — Telephone Encounter (Signed)
Wants to know about appt for her MRI

## 2011-08-28 NOTE — Telephone Encounter (Signed)
Called pt to inform of her appt for MRI on 02.08.2013 2 3:30 pm @ [facility] radiology 1st floor. Pt does not need to do any prep or restrictions. Pt agreed and understood.Loralee Pacas [place]

## 2011-08-30 ENCOUNTER — Ambulatory Visit (HOSPITAL_COMMUNITY): Admission: RE | Admit: 2011-08-30 | Payer: Self-pay | Source: Ambulatory Visit

## 2011-08-30 ENCOUNTER — Ambulatory Visit (HOSPITAL_COMMUNITY)
Admission: RE | Admit: 2011-08-30 | Discharge: 2011-08-30 | Disposition: A | Discharge status: Home / Self Care | Payer: Self-pay | Source: Ambulatory Visit | Attending: Family Medicine | Admitting: Family Medicine

## 2011-08-30 DIAGNOSIS — R937 Abnormal findings on diagnostic imaging of other parts of musculoskeletal system: Secondary | ICD-10-CM | POA: Insufficient documentation

## 2011-08-30 DIAGNOSIS — M549 Dorsalgia, unspecified: Secondary | ICD-10-CM | POA: Insufficient documentation

## 2011-08-30 DIAGNOSIS — M545 Low back pain: Secondary | ICD-10-CM

## 2011-08-30 DIAGNOSIS — G8929 Other chronic pain: Secondary | ICD-10-CM | POA: Insufficient documentation

## 2011-08-30 MED ORDER — GADOBENATE DIMEGLUMINE 529 MG/ML IV SOLN
13.0000 mL | Freq: Once | INTRAVENOUS | Status: AC | PRN
  Administered 2011-08-30: 13 mL via INTRAVENOUS

## 2011-09-04 ENCOUNTER — Telehealth: Payer: Self-pay | Admitting: Family Medicine

## 2011-09-04 NOTE — Telephone Encounter (Signed)
Wants to know results of MRI from last week and wants to know if she has to wait until Dr Sw[place]and comes back.

## 2011-09-09 ENCOUNTER — Telehealth: Payer: Self-pay | Admitting: Family Medicine

## 2011-09-09 MED ORDER — HYDROCODONE-ACETAMINOPHEN 5-500 MG PO TABS: 1.0000 | ORAL_TABLET | Freq: Two times a day (BID) | ORAL | Status: AC

## 2011-09-09 NOTE — Telephone Encounter (Signed)
Pt is calling again about MRI results and is also saying the pain pills are not working

## 2011-09-09 NOTE — Telephone Encounter (Signed)
Pt with continued pain. No relief with naproxen.  She asks for the results of her MRI and also reports that the percocets helped her back the most.  SHe has a disability hearing in 1 week.   Will call in some vidocins to help with her pain.  Discussed side effects and that she should only take 1 or 2 a day and use other pain meds as well.

## 2011-09-13 NOTE — Telephone Encounter (Signed)
Discussed results with pt  See phone note

## 2011-09-27 ENCOUNTER — Other Ambulatory Visit: Payer: Self-pay | Admitting: Family Medicine

## 2011-09-27 MED ORDER — SERTRALINE HCL 50 MG PO TABS: 50.0000 mg | ORAL_TABLET | Freq: Every day | ORAL | Status: DC

## 2011-10-04 ENCOUNTER — Other Ambulatory Visit: Payer: Self-pay | Admitting: Family Medicine

## 2011-10-04 MED ORDER — ALPRAZOLAM 0.5 MG PO TABS: 0.5000 mg | ORAL_TABLET | Freq: Three times a day (TID) | ORAL | Status: DC | PRN

## 2011-11-04 ENCOUNTER — Other Ambulatory Visit: Payer: Self-pay | Admitting: Family Medicine

## 2011-11-04 NOTE — Telephone Encounter (Signed)
Need refill for hydrocodone sent  to  Outpatient pharmacy

## 2011-11-06 MED ORDER — HYDROCODONE-ACETAMINOPHEN 5-500 MG PO TABS: 1.0000 | ORAL_TABLET | Freq: Three times a day (TID) | ORAL | Status: AC | PRN

## 2011-11-06 NOTE — Telephone Encounter (Signed)
Spoke with patient and informed her that I have called in rx for her

## 2011-11-06 NOTE — Telephone Encounter (Signed)
Will do rx for 45 tablets.

## 2011-11-06 NOTE — Telephone Encounter (Signed)
Patient is calling back to check on the status of her pain medication refill.

## 2011-11-20 ENCOUNTER — Other Ambulatory Visit: Payer: Self-pay | Admitting: Family Medicine

## 2011-11-20 MED ORDER — AMLODIPINE BESYLATE 10 MG PO TABS: 10.0000 mg | ORAL_TABLET | Freq: Every day | ORAL | Status: DC

## 2011-12-03 ENCOUNTER — Other Ambulatory Visit: Payer: Self-pay | Admitting: Family Medicine

## 2011-12-03 DIAGNOSIS — M5430 Sciatica, unspecified side: Secondary | ICD-10-CM

## 2011-12-03 MED ORDER — GABAPENTIN 800 MG PO TABS: 400.0000 mg | ORAL_TABLET | Freq: Every day | ORAL | Status: DC

## 2011-12-12 ENCOUNTER — Other Ambulatory Visit: Payer: Self-pay | Admitting: Family Medicine

## 2011-12-12 DIAGNOSIS — G4700 Insomnia, unspecified: Secondary | ICD-10-CM

## 2011-12-12 MED ORDER — HYDROXYZINE HCL 50 MG PO TABS: 50.0000 mg | ORAL_TABLET | Freq: Every evening | ORAL | Status: DC | PRN

## 2011-12-19 ENCOUNTER — Telehealth: Payer: Self-pay | Admitting: Family Medicine

## 2011-12-19 NOTE — Telephone Encounter (Signed)
Forwarded to pcp.Kleber Crean [place]  

## 2011-12-19 NOTE — Telephone Encounter (Signed)
Called pt and told her that she will most likely need to be seen by Dr. [place] in order to get an Rx for her pain meds. Pt stated that she has not heard back from disability and presently cannot afford to come into the office to be seen. I told her that I would forward this message to Dr. [place] for advice.negative.[name] [place]

## 2011-12-19 NOTE — Telephone Encounter (Signed)
Pt is pain and is asking for her pain meds.  Still hasn't rec'd disability $$ yet and now she doesn't qualify for the orange card.  Wants to speak with Dr [place]and

## 2011-12-21 NOTE — Telephone Encounter (Signed)
Agree.  She needs to be seen.  I will try to call her early next week.

## 2011-12-23 NOTE — Telephone Encounter (Signed)
Spoke with pt.  She is still having pain, and would like a refill of her pain meds.  She says that she still has a refill at [facility] pharmacy that she never picked up, but that she is not eligible to get it now because she does not have an orange card.  She went to recertify for the orange card in March, but because she was approved for disability, she did not qualify.  She has not received any disability payment or [place] card yet.  She cannot afford to come in for a visit.   She will call the [facility] pharmacy and see if they can transfer the rx to [facility].

## 2011-12-26 ENCOUNTER — Other Ambulatory Visit: Payer: Self-pay | Admitting: Family Medicine

## 2011-12-26 MED ORDER — SERTRALINE HCL 50 MG PO TABS: 50.0000 mg | ORAL_TABLET | Freq: Every day | ORAL | Status: DC

## 2011-12-31 ENCOUNTER — Telehealth: Payer: Self-pay | Admitting: Clinical

## 2011-12-31 NOTE — Telephone Encounter (Signed)
Message copied by Theresia Bough on Tue Dec 31, 2011  9:54 AM ------      Message from: [place], SARAH T      Created: Mon Dec 23, 2011  2:10 PM      Regarding: disability vs orange card       Pricilla Riffle,      This pt says she got approved for disability about 10 weeks ago, but has not received any benefits or Medicaid card yet. (they told her it would be 8-12 weeks) Previously, she had the orange card, but when she tried to recertify in March, she was denied because she had the info that she was approved for disability.  So, she is still waiting for a Medicaid card, but does not have the orange card, and has no way to pay for health care.        Is there anything that could be done for her?  Would Medicaid be retroactive if she came in for a visit with [place]?      Thanks!      Sarah [place]

## 2011-12-31 NOTE — Telephone Encounter (Signed)
Clinical Child psychotherapist (CSW) received a referral as pt had not received her disability or Medicaid card. CSW contacted pt and was informed by pt that she received a lump some from her disability and a Medicare card. Pt stated she has scheduled an appt with her PCP for the 18th. Pt sounded very pleased and appreciated CSW call. No further concerns.  Theresia Bough, MSW, Theresia Majors [phone]

## 2012-01-07 ENCOUNTER — Encounter: Payer: Self-pay | Admitting: Family Medicine

## 2012-01-07 ENCOUNTER — Ambulatory Visit (INDEPENDENT_AMBULATORY_CARE_PROVIDER_SITE_OTHER): Payer: Medicare Other | Admitting: Family Medicine

## 2012-01-07 VITALS — BP 158/90 | Temp 97.6°F | Ht 60.0 in | Wt 141.1 lb

## 2012-01-07 DIAGNOSIS — E663 Overweight: Secondary | ICD-10-CM

## 2012-01-07 DIAGNOSIS — R0789 Other chest pain: Secondary | ICD-10-CM

## 2012-01-07 DIAGNOSIS — I10 Essential (primary) hypertension: Secondary | ICD-10-CM

## 2012-01-07 DIAGNOSIS — M545 Low back pain: Secondary | ICD-10-CM

## 2012-01-07 DIAGNOSIS — F17200 Nicotine dependence, unspecified, uncomplicated: Secondary | ICD-10-CM

## 2012-01-07 DIAGNOSIS — F411 Generalized anxiety disorder: Secondary | ICD-10-CM

## 2012-01-07 DIAGNOSIS — Z720 Tobacco use: Secondary | ICD-10-CM

## 2012-01-07 MED ORDER — METOPROLOL TARTRATE 25 MG PO TABS: 25.0000 mg | ORAL_TABLET | Freq: Two times a day (BID) | ORAL | Status: DC

## 2012-01-07 MED ORDER — ALPRAZOLAM 0.5 MG PO TABS: 0.5000 mg | ORAL_TABLET | Freq: Three times a day (TID) | ORAL | Status: DC | PRN

## 2012-01-07 MED ORDER — HYDROCODONE-ACETAMINOPHEN 5-500 MG PO TABS: 1.0000 | ORAL_TABLET | Freq: Three times a day (TID) | ORAL | Status: AC | PRN

## 2012-01-07 NOTE — Progress Notes (Signed)
  Subjective:    Patient ID: Norrine Ballester, female    DOB: 1961-02-24, 51 y.o.   MRN: [id number]  HPI 1) Larey Seat yesterday.  L leg gave out and hit knee, hit ankle, little toe and L arm.  No LOC.  No dizziness.  No head trauma.  2) Groin pain is getting worse.  Radiates from L hip to pubic syphysis.  Now starting to occur on R.  Pain is getting stronger.  Has had pain for 2.5 to 3 years.  Walking makes it worse.  Does not go up many stairs.  Pain pills and lying on R side make it better.  Pain occurs constantly.  No vginal discharge.  + urinary incontinence.  Takes pain pills without relief.    3)L arm still sore from Tetanus shot. (Done several months ago).  REports she can't elevate her arm.  SHe feels it pulls.  Occurs daily since tetnaus shot.  Limits activities - lifting coffee pot, closing car door.  Nothing makes it better.   4)Two days ago had sharp pain, like a cramp under L breast, to shoulder blade, on L side. Started when she got a cabbage out of fridge and stood up.  Was unable to to function on Sunday.  Worried she was having a heart attack and took an aspirin.  Checked BP at time (150/80).  + Dizzy.  Still with pain, but slowly getting better.  No improvement with pain pills.  Pain worse with deep breast.   5)Weight gain - Has gained weight in last 6 months.  No exercise.  REports she is not eating a lot.  +FH obesity - (every in 200s).  Her baseline is 125-130.  Would be interested in meeting with nutritionist.  Has tried to do weight watchers.  Feels it is expensive to eat healthy.  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  Does every weekend.   6)Also would like disability placard filled out.   Also request refills of Xanax.  Reports that she does not need them very often - just takes once in awhile.   Review of Systems Other than #4, no chest pain.  No SOB  Still smoking 6 cigs/day.  Did get disability and is getting a check.  She feels it is not enough to help much, but at least it is  something.     Objective:   Physical Exam  Nursing note and vitals reviewed. Constitutional: She appears well-developed and well-nourished. No distress.  Eyes: Pupils are equal, round, and reactive to light.  Cardiovascular: Normal rate, regular rhythm and normal heart sounds.  Exam reveals no gallop and no friction rub.   No murmur heard. Pulmonary/Chest: Effort normal and breath sounds normal. No respiratory distress. She has no wheezes. She has no rales.  Skin: She is not diaphoretic.  Psychiatric: She has a normal mood and affect. Her behavior is normal. Judgment and thought content normal.          Assessment & Plan:

## 2012-01-07 NOTE — Patient Instructions (Addendum)
I am glad that you have cut back on cigarettes.  It would be important for your heatlh to quit completely.  We will make an appt with Dr. Raymondo Band to talk about this.  Also, you can call the QUITLINE [id number]. We will do a referral to the pain clinic to manage your pain.  I will fill your pain medicines now for this month, but the pain clinic will take over the management of your pain. We should add another medicine to your regimen for your blood pressure.  We will get that from the health department. Please schedule with Dr. Wyona Almas for nutrition counseling.

## 2012-01-08 ENCOUNTER — Other Ambulatory Visit: Payer: Self-pay | Admitting: Family Medicine

## 2012-01-08 DIAGNOSIS — M5430 Sciatica, unspecified side: Secondary | ICD-10-CM

## 2012-01-08 MED ORDER — GABAPENTIN 800 MG PO TABS: 400.0000 mg | ORAL_TABLET | Freq: Every day | ORAL | Status: DC

## 2012-01-09 ENCOUNTER — Encounter: Payer: Self-pay | Admitting: Family Medicine

## 2012-01-09 DIAGNOSIS — E663 Overweight: Secondary | ICD-10-CM | POA: Insufficient documentation

## 2012-01-09 NOTE — Assessment & Plan Note (Signed)
Pt with increasing weight.  Reviewed lifestyle changes.  Refer to Dr. Gerilyn Pilgrim for nutrition counseling.

## 2012-01-09 NOTE — Assessment & Plan Note (Signed)
Poor control.  Add metoprolol to regimen.  Follow up 2 weeks for bp check.

## 2012-01-09 NOTE — Assessment & Plan Note (Signed)
PT would like to quit.  Is willing to meet with Dr. Raymondo Band to discuss cessation and options.

## 2012-01-09 NOTE — Assessment & Plan Note (Signed)
Pt with chronic short acting narcotics.  Needs better management long term.  Will refer to pain clinic.  Pt agrees.

## 2012-01-09 NOTE — Assessment & Plan Note (Signed)
Pt with cardiac risk factors, but current pain is not typical of angina (sharp, occurred with movement, constant since onset for days, slowly improving) Will continue to monitor.

## 2012-01-09 NOTE — Assessment & Plan Note (Signed)
Refill Xanax today.  Pt has declined therapy in the past.  WOuld likely benefit from therapy if she is willing to go.

## 2012-01-14 ENCOUNTER — Other Ambulatory Visit: Payer: Medicare Other

## 2012-01-14 ENCOUNTER — Ambulatory Visit: Payer: Medicare Other | Admitting: Pharmacist

## 2012-01-16 ENCOUNTER — Encounter: Payer: Self-pay | Admitting: Physical Medicine & Rehabilitation

## 2012-01-16 ENCOUNTER — Telehealth: Payer: Self-pay | Admitting: *Deleted

## 2012-01-16 ENCOUNTER — Ambulatory Visit: Payer: Medicare Other | Admitting: Family Medicine

## 2012-01-16 NOTE — Telephone Encounter (Signed)
LVM for patient to call back to inform her of pain clinic referral appointment set up at [facility]. This is an evaluation appointment only. She will be seeing Dr. Wynn Banker. 02/14/2012 @ 12:15pm. [address], [address] phone number is [phone]. If patient is unable to make this appointment she is to call and reschedule. She is going to receive a new patient packet in the mail on all of this information.

## 2012-01-17 NOTE — Telephone Encounter (Signed)
Spoke with patient and informed her of the appointment and information below

## 2012-01-24 ENCOUNTER — Other Ambulatory Visit: Payer: Self-pay | Admitting: Family Medicine

## 2012-01-24 DIAGNOSIS — M5430 Sciatica, unspecified side: Secondary | ICD-10-CM

## 2012-01-24 MED ORDER — GABAPENTIN 800 MG PO TABS: ORAL_TABLET | ORAL | Status: DC

## 2012-01-24 NOTE — Progress Notes (Signed)
Faxed to [facility]

## 2012-02-06 ENCOUNTER — Telehealth: Payer: Self-pay | Admitting: Family Medicine

## 2012-02-06 NOTE — Telephone Encounter (Signed)
Received notice that pt missed her pain clinic appt.  She cancelled due to scheduling conflict and rescheduled for 03/10/12.

## 2012-02-14 ENCOUNTER — Ambulatory Visit: Payer: Medicare Other | Admitting: Physical Medicine & Rehabilitation

## 2012-03-10 ENCOUNTER — Telehealth: Payer: Self-pay | Admitting: *Deleted

## 2012-03-10 ENCOUNTER — Ambulatory Visit: Payer: Medicare Other | Admitting: Physical Medicine & Rehabilitation

## 2012-03-10 ENCOUNTER — Encounter: Payer: Medicare Other | Attending: Physical Medicine & Rehabilitation

## 2012-03-10 NOTE — Telephone Encounter (Signed)
LVM for patient to call back. Patient no showed her appointment tfor the pain clinic on 03/10/2012. She spoke with Dr. [place] 7/28 in which patient informed her that appointment on 7/26 had to be rescheduled for 8/20. I received fax that patient no showed her 8/20 appointment and now she will not able to be seen there again @ [facility].Erma Pinto

## 2012-03-13 NOTE — Telephone Encounter (Signed)
LVM for patient call back. Got hung up on first time and then VM second time.[name]

## 2012-04-16 ENCOUNTER — Ambulatory Visit (INDEPENDENT_AMBULATORY_CARE_PROVIDER_SITE_OTHER): Payer: Medicare Other | Admitting: Family Medicine

## 2012-04-16 ENCOUNTER — Encounter: Payer: Self-pay | Admitting: Family Medicine

## 2012-04-16 VITALS — BP 130/67 | HR 61 | Temp 97.9°F | Ht 60.0 in | Wt 142.0 lb

## 2012-04-16 DIAGNOSIS — M545 Low back pain: Secondary | ICD-10-CM

## 2012-04-16 DIAGNOSIS — Z720 Tobacco use: Secondary | ICD-10-CM

## 2012-04-16 DIAGNOSIS — F411 Generalized anxiety disorder: Secondary | ICD-10-CM

## 2012-04-16 DIAGNOSIS — F17200 Nicotine dependence, unspecified, uncomplicated: Secondary | ICD-10-CM

## 2012-04-16 DIAGNOSIS — M549 Dorsalgia, unspecified: Secondary | ICD-10-CM

## 2012-04-16 MED ORDER — NAPROXEN 500 MG PO TABS: 500.0000 mg | ORAL_TABLET | Freq: Two times a day (BID) | ORAL | Status: AC

## 2012-04-16 MED ORDER — CYCLOBENZAPRINE HCL 5 MG PO TABS: 5.0000 mg | ORAL_TABLET | Freq: Two times a day (BID) | ORAL | Status: DC | PRN

## 2012-04-16 MED ORDER — ALPRAZOLAM 0.5 MG PO TABS: 0.5000 mg | ORAL_TABLET | Freq: Every evening | ORAL | Status: DC | PRN

## 2012-04-16 NOTE — Progress Notes (Signed)
  Subjective:    [name], female    DOB: [birth year], 51 y.o.   MRN: [id number]  HPI This is my first time seen [name]. She comes today with the complain of chronic back pain. She has been evaluated in the recent past and referred to pain clinic for better management of this condition. She declined to go to pain clinic since she states that does not like "the people that goes to those places". She mentions that the origin of her pain are the surgeries she has had on her back 20 years ago and for which now is disable. She states that she was evaluated about 3 years ago by orthopedic surgeon who offered surgery. At that time she declined surgery and was treated with steroid shots on her back. Now she would like to get reevaluated and reconsider the surgical route to alleviate her condition.  Pain is 8/10 localized on lower back, irradiates intermittently to left leg. Pt denies saddle anesthesia, urinary or fecal incontinence.    Also would like a refill of her xanax she takes for anxiety. She uses it only PRN and has help her. No side effects reported.  Review of Systems Per HPI    Objective:   Physical Exam Gen:  NAD EXT: No edema. MSK: no pain to palpation of vertebral processes. Tenderness and muscular contraction felt to palpation bilaterally parallel to lumbar spine. Negative straight leg raise BL. No pain released to hip joint passive and active ROM.  Neuro: Alert and oriented x3. No focalization. 4/5 reflexes symmetrical. Normal gait ( non antalgic)     Assessment & Plan:

## 2012-04-16 NOTE — Assessment & Plan Note (Signed)
This is chronic on her. No signs of neurologic complications. Pt declined pain clinic management of her chronic pain.  Plan: Naproxen and flexeril  Orthopedic consult to reevaluate possibility of surgical options. Follow up as needed for pain and in 2-3 months for her other medical conditions.

## 2012-04-16 NOTE — Patient Instructions (Addendum)
It has been a pleasure to see you today. Naproxen and Flexeril has been prescribed, as well as xanax refill. The orthopedic consult has been placed. Follow up appointment in 2-3 months or sooner if needed.

## 2012-04-16 NOTE — Assessment & Plan Note (Addendum)
Refilled xanax. Pt takes this PRN only when she feels anxious. Non on a daily bases.

## 2012-04-16 NOTE — Assessment & Plan Note (Deleted)
This is chronic on her. No signs of neurologic complications. Pt decline pain clinic management of her chronic pain.  Plan: Naproxen and flexeril  Orthopedic consult to reevaluate possibility of surgical options. Follow up as needed for pain and in 2-3 months for her other medical conditions.

## 2012-04-27 ENCOUNTER — Telehealth: Payer: Self-pay | Admitting: *Deleted

## 2012-04-27 NOTE — Telephone Encounter (Signed)
[name] with Dr. Nelta Numbers office calling.  We had sent them a record release to try and get records of [name]'s previous back surgeries  and they are unable to release these records.  States we will need to get them from the Hospital in which the surgeries were performed at.  Will have [name] sign a record release at her next office visit to try and get records from the hospital in [place] where she had these surgeries.  We need these records in order to process her referral to an Orthopedic Surgeon in [place] but they will not schedule her until they get these records. [name]

## 2012-06-26 ENCOUNTER — Encounter: Payer: Self-pay | Admitting: Family Medicine

## 2012-06-26 ENCOUNTER — Ambulatory Visit (INDEPENDENT_AMBULATORY_CARE_PROVIDER_SITE_OTHER): Payer: Medicare Other | Admitting: Family Medicine

## 2012-06-26 VITALS — BP 141/61 | HR 65 | Temp 98.1°F | Ht 60.0 in | Wt 141.6 lb

## 2012-06-26 DIAGNOSIS — I10 Essential (primary) hypertension: Secondary | ICD-10-CM

## 2012-06-26 DIAGNOSIS — F411 Generalized anxiety disorder: Secondary | ICD-10-CM

## 2012-06-26 DIAGNOSIS — R739 Hyperglycemia, unspecified: Secondary | ICD-10-CM

## 2012-06-26 DIAGNOSIS — F419 Anxiety disorder, unspecified: Secondary | ICD-10-CM

## 2012-06-26 DIAGNOSIS — M549 Dorsalgia, unspecified: Secondary | ICD-10-CM

## 2012-06-26 DIAGNOSIS — G8929 Other chronic pain: Secondary | ICD-10-CM

## 2012-06-26 DIAGNOSIS — R7309 Other abnormal glucose: Secondary | ICD-10-CM

## 2012-06-26 DIAGNOSIS — R42 Dizziness and giddiness: Secondary | ICD-10-CM

## 2012-06-26 MED ORDER — ALPRAZOLAM 0.5 MG PO TABS: 0.5000 mg | ORAL_TABLET | Freq: Every evening | ORAL | Status: DC | PRN

## 2012-06-26 MED ORDER — CYCLOBENZAPRINE HCL 5 MG PO TABS: 5.0000 mg | ORAL_TABLET | Freq: Two times a day (BID) | ORAL | Status: DC | PRN

## 2012-06-26 NOTE — Assessment & Plan Note (Addendum)
Seems positional vertigo even though Dix Hillpike maneuvers are negative. Plan: Instructed to avoid brisk movements of head. 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 F/u as needed.

## 2012-06-26 NOTE — Patient Instructions (Addendum)
It has been a pleasure to see you today. Please take the medications as prescribed. Make your next appointment in 3-4 months or sooner if needed

## 2012-06-26 NOTE — Progress Notes (Signed)
Family Medicine Office Visit Note   Subjective:   [name], female  DOB: Dec 15, 1960, 51 y.o.. MRN: [id number]   Pt that comes today for medication refill. Her complain today is spinning sensation when she suddenly changes position of the head. This is reported to be intermitent and happening for a "while". No falls or hx of loosing balance. Denies noticing any decreased in hearing acuity.  She also comes to refill medication: Xanax PRN due to insomnia and anxiety. Pt reports help. No signs of tolerance, abuse or misuse. Flexeryl: she reports help her with her chronic back pain. Pt is not currently on any chronic opioid pain management.  Review of Systems:  Pt denies SOB, chest pain, palpitations, headaches, dizziness, numbness or weakness. No changes on urinary or BM habits. No unintentional weigh loss/gain.  Objective:   Physical Exam: Gen:  NAD HEENT: Moist mucous membranes. ENT: ears normal. TM with no deformities, good light reflex.  CV: Regular rate and rhythm, no murmurs rubs or gallops PULM: Clear to auscultation bilaterally. No wheezes/rales/rhonchi ABD: Soft, non tender, non distended, normal bowel sounds EXT: No edema. Neuro: Alert and oriented x3. No focalization. Dix Hallpike maneuver negative. Weber and Rinne within normal limits (no lateralization, air conduction longer than bone conduction)   Assessment & Plan:

## 2012-06-28 NOTE — Assessment & Plan Note (Signed)
Refilled xanax today. For more detail review HPI of this office visit.

## 2012-07-01 ENCOUNTER — Other Ambulatory Visit: Payer: Self-pay | Admitting: Family Medicine

## 2012-07-01 DIAGNOSIS — M5430 Sciatica, unspecified side: Secondary | ICD-10-CM

## 2012-07-07 ENCOUNTER — Other Ambulatory Visit (INDEPENDENT_AMBULATORY_CARE_PROVIDER_SITE_OTHER): Payer: Medicare Other

## 2012-07-07 DIAGNOSIS — R7309 Other abnormal glucose: Secondary | ICD-10-CM

## 2012-07-07 DIAGNOSIS — R739 Hyperglycemia, unspecified: Secondary | ICD-10-CM

## 2012-07-07 DIAGNOSIS — I10 Essential (primary) hypertension: Secondary | ICD-10-CM

## 2012-07-07 LAB — LIPID PANEL
LDL Cholesterol: 151 mg/dL — ABNORMAL HIGH (ref 0–99)
Triglycerides: 115 mg/dL (ref <150)
VLDL: 23 mg/dL (ref 0–40)

## 2012-07-07 NOTE — Progress Notes (Signed)
FLP AND A1C DONE TODAY [name]

## 2012-07-28 ENCOUNTER — Telehealth: Payer: Self-pay | Admitting: Family Medicine

## 2012-07-28 NOTE — Telephone Encounter (Signed)
Wants to know results of her labs  Is also asking about the release form that she signed for records from [facility] - she signed them the last time she was here.

## 2012-07-29 NOTE — Telephone Encounter (Signed)
Pt needs to schedule appointment to discuss labs results. I have received records from [facility] but not from [facility].

## 2012-07-30 NOTE — Telephone Encounter (Signed)
LVM for patient to call back. ?

## 2012-07-30 NOTE — Telephone Encounter (Signed)
Pt made appt for 12/3

## 2012-08-13 ENCOUNTER — Ambulatory Visit (INDEPENDENT_AMBULATORY_CARE_PROVIDER_SITE_OTHER): Payer: Medicare Other | Admitting: Family Medicine

## 2012-08-13 ENCOUNTER — Encounter: Payer: Self-pay | Admitting: Family Medicine

## 2012-08-13 VITALS — BP 131/59 | HR 65 | Temp 98.2°F | Wt 141.5 lb

## 2012-08-13 DIAGNOSIS — F411 Generalized anxiety disorder: Secondary | ICD-10-CM

## 2012-08-13 DIAGNOSIS — Z789 Other specified health status: Secondary | ICD-10-CM

## 2012-08-13 DIAGNOSIS — I6350 Cerebral infarction due to unspecified occlusion or stenosis of unspecified cerebral artery: Secondary | ICD-10-CM

## 2012-08-13 DIAGNOSIS — Z9189 Other specified personal risk factors, not elsewhere classified: Secondary | ICD-10-CM | POA: Insufficient documentation

## 2012-08-13 DIAGNOSIS — M549 Dorsalgia, unspecified: Secondary | ICD-10-CM

## 2012-08-13 DIAGNOSIS — I10 Essential (primary) hypertension: Secondary | ICD-10-CM

## 2012-08-13 DIAGNOSIS — F410 Panic disorder [episodic paroxysmal anxiety] without agoraphobia: Secondary | ICD-10-CM

## 2012-08-13 DIAGNOSIS — G8929 Other chronic pain: Secondary | ICD-10-CM

## 2012-08-13 DIAGNOSIS — F419 Anxiety disorder, unspecified: Secondary | ICD-10-CM

## 2012-08-13 DIAGNOSIS — E785 Hyperlipidemia, unspecified: Secondary | ICD-10-CM

## 2012-08-13 DIAGNOSIS — I639 Cerebral infarction, unspecified: Secondary | ICD-10-CM

## 2012-08-13 DIAGNOSIS — Z23 Encounter for immunization: Secondary | ICD-10-CM

## 2012-08-13 MED ORDER — PAROXETINE HCL 20 MG PO TABS: 20.0000 mg | ORAL_TABLET | ORAL | Status: DC

## 2012-08-13 MED ORDER — CYCLOBENZAPRINE HCL 5 MG PO TABS: 5.0000 mg | ORAL_TABLET | Freq: Two times a day (BID) | ORAL | Status: DC | PRN

## 2012-08-13 MED ORDER — ATORVASTATIN CALCIUM 40 MG PO TABS: 40.0000 mg | ORAL_TABLET | Freq: Every day | ORAL | Status: DC

## 2012-08-13 NOTE — Assessment & Plan Note (Signed)
Pt was on Paxil before and noticed better response of her anxiety.  Plan: Change Zoloft to Paxil. Reevaluate in 2-3 weeks. Discussed signs that should prompt re-evaluation.

## 2012-08-13 NOTE — Progress Notes (Signed)
Family Medicine Office Visit Note   Subjective:   [name], female  DOB: June 30, 1961, 52 y.o.. MRN: [id number]   Pt that comes today complaining of exacerbation of her anxiety for the past month. She reports waking up at night with sensation of fear and in "total panic" she does not remember to be dreaming about anything in particular, but she feels afraid and sometime cries. This events don't happened during the day. She denies pain or palpitations with it.  New events she identifies as stressors are: She recently resume her marital life with her husband (they were separated for a while). Her mother in-law is demented with recent decline on her health and she helps to take care of her.  Her other medical problem she would like to address today are her labs results. LDL was 151. A1C  At 6.2   Review of Systems:  Pt denies SOB, headaches, dizziness, numbness or weakness. No changes on urinary or BM habits. No unintentional weigh loss/gain. Pt denies suicidal thoughts or plans.  Objective:   Physical Exam: Gen:  NAD HEENT: Moist mucous membranes  CV: Regular rate and rhythm, no murmurs rubs or gallops PULM: Clear to auscultation bilaterally. No wheezes/rales/rhonchi ABD: Soft, non tender, non distended, normal bowel sounds EXT: No edema Neuro: Alert and oriented x3. No focalization  Assessment & Plan:

## 2012-08-13 NOTE — Assessment & Plan Note (Signed)
A1C 6.2 Discussed with pt in depth. Plan: Healthy food choices and portion control as well as exercise regimen.  Recheck in 6 months

## 2012-08-13 NOTE — Assessment & Plan Note (Signed)
Controlled. No changes on her current regimen.

## 2012-08-13 NOTE — Patient Instructions (Addendum)
Please stop taking Zoloft and start taking Paxil  At the dose recommended. Your cholesterol is elevated. Please start taking Lipitor as prescribed. Your tests showed that the glucose (sugar in blood) seems to be elevated to the point of making you  at risk of developing Diabetes. Remember what we discussed today about your diet. Make a f/u appointment in 2-3 weeks or sooner if needed.

## 2012-08-13 NOTE — Assessment & Plan Note (Signed)
Continue ASA and now on statin

## 2012-08-13 NOTE — Assessment & Plan Note (Signed)
For her risk stratification pt goal for LDL is below 70. (prior Hx of CVA) Plan: Discussed with pt will start Lipitor 40 mg daily and diet.

## 2012-08-18 ENCOUNTER — Other Ambulatory Visit: Payer: Self-pay | Admitting: Family Medicine

## 2012-08-18 DIAGNOSIS — F410 Panic disorder [episodic paroxysmal anxiety] without agoraphobia: Secondary | ICD-10-CM

## 2012-08-18 DIAGNOSIS — F419 Anxiety disorder, unspecified: Secondary | ICD-10-CM

## 2012-08-18 DIAGNOSIS — E785 Hyperlipidemia, unspecified: Secondary | ICD-10-CM

## 2012-08-18 DIAGNOSIS — I639 Cerebral infarction, unspecified: Secondary | ICD-10-CM

## 2012-08-18 MED ORDER — ATORVASTATIN CALCIUM 40 MG PO TABS: 40.0000 mg | ORAL_TABLET | Freq: Every day | ORAL | Status: DC

## 2012-08-18 MED ORDER — PAROXETINE HCL 20 MG PO TABS: 20.0000 mg | ORAL_TABLET | ORAL | Status: DC

## 2012-09-14 ENCOUNTER — Encounter: Payer: Self-pay | Admitting: Family Medicine

## 2012-09-14 ENCOUNTER — Telehealth: Payer: Self-pay | Admitting: Family Medicine

## 2012-09-14 DIAGNOSIS — I639 Cerebral infarction, unspecified: Secondary | ICD-10-CM

## 2012-09-14 DIAGNOSIS — E785 Hyperlipidemia, unspecified: Secondary | ICD-10-CM

## 2012-09-14 MED ORDER — ATORVASTATIN CALCIUM 40 MG PO TABS: 40.0000 mg | ORAL_TABLET | Freq: Every day | ORAL | Status: DC

## 2012-09-14 MED ORDER — FLUOXETINE HCL 20 MG PO TABS: 20.0000 mg | ORAL_TABLET | Freq: Every day | ORAL | Status: DC

## 2012-09-14 MED ORDER — GABAPENTIN 800 MG PO TABS: ORAL_TABLET | ORAL | Status: DC

## 2012-09-14 NOTE — Telephone Encounter (Signed)
[name] calling to say that [facility] no longer have the Paxil or the Neurontin meds for refills.  The only thing she can get comparable to the Paxil is the Prozac.  Also did not receive refill for the Lipitor.  Patient need new rxs sent in for meds or send pain med to Out[facility] pharmacy for pickup.

## 2012-09-14 NOTE — Telephone Encounter (Signed)
Gabapentin prescription sent to [facility]

## 2012-09-14 NOTE — Telephone Encounter (Signed)
Dear Cliffton Asters [facility] [place] I have changed her to prozac and if you can get the rx to print, you can send it and the lipuitor refill. I see NOTHINg  In the note about pain meds---she will have to take that up with Dr Aviva Signs. Re the gabapentin--is she saying she needs a new rx or that [facility] no longer carries it? If she needs new rx Ok to call one in for 3 m, if they no longer carry it she will have to discuss w Dr Clovis Cao! Denny Levy

## 2012-12-09 ENCOUNTER — Ambulatory Visit (INDEPENDENT_AMBULATORY_CARE_PROVIDER_SITE_OTHER): Payer: Medicare Other | Admitting: Family Medicine

## 2012-12-09 ENCOUNTER — Encounter: Payer: Self-pay | Admitting: Family Medicine

## 2012-12-09 VITALS — BP 133/66 | HR 54 | Ht 60.0 in | Wt 137.0 lb

## 2012-12-09 DIAGNOSIS — E7800 Pure hypercholesterolemia, unspecified: Secondary | ICD-10-CM

## 2012-12-09 DIAGNOSIS — F411 Generalized anxiety disorder: Secondary | ICD-10-CM

## 2012-12-09 DIAGNOSIS — E785 Hyperlipidemia, unspecified: Secondary | ICD-10-CM

## 2012-12-09 DIAGNOSIS — M549 Dorsalgia, unspecified: Secondary | ICD-10-CM

## 2012-12-09 DIAGNOSIS — I6350 Cerebral infarction due to unspecified occlusion or stenosis of unspecified cerebral artery: Secondary | ICD-10-CM

## 2012-12-09 DIAGNOSIS — G8929 Other chronic pain: Secondary | ICD-10-CM

## 2012-12-09 DIAGNOSIS — I639 Cerebral infarction, unspecified: Secondary | ICD-10-CM

## 2012-12-09 MED ORDER — METOPROLOL TARTRATE 25 MG PO TABS: 25.0000 mg | ORAL_TABLET | Freq: Two times a day (BID) | ORAL | Status: DC

## 2012-12-09 MED ORDER — GABAPENTIN 800 MG PO TABS: ORAL_TABLET | ORAL | Status: DC

## 2012-12-09 MED ORDER — PAROXETINE HCL 10 MG PO TABS: 10.0000 mg | ORAL_TABLET | ORAL | Status: DC

## 2012-12-09 MED ORDER — ATORVASTATIN CALCIUM 40 MG PO TABS: 40.0000 mg | ORAL_TABLET | Freq: Every day | ORAL | Status: DC

## 2012-12-09 MED ORDER — AMLODIPINE BESYLATE 10 MG PO TABS: 10.0000 mg | ORAL_TABLET | Freq: Every day | ORAL | Status: DC

## 2012-12-09 MED ORDER — CYCLOBENZAPRINE HCL 5 MG PO TABS: 5.0000 mg | ORAL_TABLET | Freq: Two times a day (BID) | ORAL | Status: DC | PRN

## 2012-12-10 NOTE — Assessment & Plan Note (Signed)
Prescription for Paxil given. Discussed Xanax side effects and benefits and pt would like to discontinue this medication. Last time used was weeks ago so there is no withdrawal symptoms that we need to prevent.

## 2012-12-10 NOTE — Progress Notes (Signed)
Family Medicine Office Visit Note   Subjective:   [name], female  DOB: 14-Sep-1960, 52 y.o.. MRN: [id number]   Pt that comes today to refill her meds. She denies current complaints. She never started Paxil and desires to start it to prevent her Panic Attacks. This happens now very sporadically and she has used Xanax occasionally to alleviate her symptoms. She reports having less familiar stressors and spending good  time sewing which states really enjoys.  Review of Systems:  Pt denies SOB, chest pain, palpitations, headaches, dizziness, numbness or weakness. No changes on urinary or BM habits. No unintentional weigh loss/gain.  Objective:   Physical Exam: Gen:  NAD HEENT: Moist mucous membranes  CV: Regular rate and rhythm, no murmurs rubs or gallops PULM: Clear to auscultation bilaterally. No wheezes/rales/rhonchi ABD: Soft, non tender, non distended, normal bowel sounds EXT: No edema Neuro: Alert and oriented x3. No focalization Psych: normal affect and mood. No agitation, normal language.  Assessment & Plan:

## 2012-12-10 NOTE — Patient Instructions (Addendum)
It has been a pleasure to see you today. Please take the medications as prescribed. Make your next appointment in 2-3 months or sooner if needed.

## 2013-02-10 ENCOUNTER — Telehealth: Payer: Self-pay | Admitting: Family Medicine

## 2013-02-10 NOTE — Telephone Encounter (Signed)
Patient has been on Paxil for a little over a month and does not think that it is working. She states she is constantly crying and that the medicine makes her itch. Would like to know should she continue to take meds or come in to be reassessed ?

## 2013-02-11 NOTE — Telephone Encounter (Signed)
instructed patient to  call office to schedule urgent appt with Dr Aviva Signs since antidepressant not working. she voiced understanding.[name], [name]

## 2013-02-18 ENCOUNTER — Ambulatory Visit: Payer: Medicare Other | Admitting: Family Medicine

## 2013-02-18 ENCOUNTER — Telehealth: Payer: Self-pay | Admitting: *Deleted

## 2013-02-18 NOTE — Telephone Encounter (Signed)
Pt scheduled for 145 with [place]. Wyatt Haste, RN-BSN

## 2013-03-15 ENCOUNTER — Encounter: Payer: Self-pay | Admitting: Family Medicine

## 2013-03-15 ENCOUNTER — Ambulatory Visit (INDEPENDENT_AMBULATORY_CARE_PROVIDER_SITE_OTHER): Payer: Medicare Other | Admitting: Family Medicine

## 2013-03-15 VITALS — BP 138/88 | HR 82 | Temp 97.6°F | Ht 60.0 in | Wt 136.5 lb

## 2013-03-15 DIAGNOSIS — F329 Major depressive disorder, single episode, unspecified: Secondary | ICD-10-CM

## 2013-03-15 DIAGNOSIS — IMO0002 Reserved for concepts with insufficient information to code with codable children: Secondary | ICD-10-CM

## 2013-03-15 DIAGNOSIS — F3289 Other specified depressive episodes: Secondary | ICD-10-CM

## 2013-03-15 MED ORDER — FLUOXETINE HCL 20 MG PO CAPS: 20.0000 mg | ORAL_CAPSULE | Freq: Every day | ORAL | Status: DC

## 2013-03-15 NOTE — Assessment & Plan Note (Signed)
Paxil changed to Fluoxetine Close monitoring Discussed benzodiazepine only if needed to bridge for two weeks before Fluoxetine takes effect. No the best medication for her, long term. Prescription not extended today as pt agree with the above.

## 2013-03-15 NOTE — Assessment & Plan Note (Signed)
Most likely from vaginal atrophy although pt declined pelvic exam today. P/ List provided of moisturizer and lubricants pt can use.  Discussed benefits and risks of using topical estrogens. Pt would like to try lubricants as next step. F/u as needed.

## 2013-03-15 NOTE — Patient Instructions (Addendum)
Stop taking Paxil. Next day start taking Fluoxetine 10 mg (1/2 tablet) in th evening. After a week you can increase a 1 tab in the evening. Make a follow up appointment in 2-3 weeks to make the adjustments needed in your therapy. You have a list of moistturizers and lubricants you can use with coitus. Follow up this issue as needed.

## 2013-03-15 NOTE — Progress Notes (Signed)
Family Medicine Office Visit Note   Subjective:   [name], female  DOB: [birth year], 52 y.o.. MRN: [id number]   Pt that comes today complaining of Paxil does not help her with depression and she has found her nose itches when she takes it. Pt reports she would like to change it to Fluoxetine. She mentions was on this medication in the past and helped her. She is also asking for "xanax or ativan" (named meds) in order to get better. She reports has a difficult situation at home she knows will get better in a year when she does not have to support her step-son and will be able to have the money to have her own place. Meanwhile she "needs something to be able to cope with her current symptoms and reality"  She denies suicidal/homicidaal thought or plans.  Her other concern today is painful intercourse. She has been sexually inactive for several years but now she is back with her husband and penetration is reported to be painful. She states has no difficult with libido and achieving orgasm but she avoids sex due to this being a physically painful experience. She has tried moisturizers but it has not helped. Denies vaginal discharge or dysuria.    Review of Systems:  Pt denies SOB, chest pain, palpitations, headaches, dizziness, numbness or weakness. No changes on urinary or BM habits. No unintentional weigh loss/gain.  Objective:   Physical Exam: Gen:  NAD HEENT: Moist mucous membranes  CV: Regular rate and rhythm, no murmurs rubs or gallops PULM: Clear to auscultation bilaterally. No wheezes/rales/rhonchi EXT: No edema Neuro: Alert and oriented x3. No focalization Psych: depressed mood and affect. No suicidal thoughts of plans. No agitation, normal speech. No hallucinations.  Declines vaginal examination today   Assessment & Plan:

## 2013-04-05 ENCOUNTER — Ambulatory Visit: Payer: Medicare Other | Admitting: Family Medicine

## 2013-04-13 ENCOUNTER — Ambulatory Visit (INDEPENDENT_AMBULATORY_CARE_PROVIDER_SITE_OTHER): Payer: Medicare Other | Admitting: Family Medicine

## 2013-04-13 VITALS — BP 137/47 | HR 67 | Temp 98.4°F | Ht 60.0 in | Wt 136.0 lb

## 2013-04-13 DIAGNOSIS — M545 Low back pain: Secondary | ICD-10-CM

## 2013-04-13 DIAGNOSIS — F329 Major depressive disorder, single episode, unspecified: Secondary | ICD-10-CM

## 2013-04-13 NOTE — Assessment & Plan Note (Signed)
On Fluoxetine 20mg  QHS for 1 month and reports doing well. Will continue this dose  F/u in 2 months or sooner if needed.

## 2013-04-13 NOTE — Patient Instructions (Signed)
Continue taking fluoxetine 20 mg before bedtime daily. If you feel that this medication is no longer helping please make an appointment sooner otherwise I will see you in 2 months. Have placed a referral for the pain clinic in order for you to get evaluated. It is very important that you signed release form in order for [place] to get more accurate information about your surgeries.

## 2013-04-13 NOTE — Assessment & Plan Note (Signed)
Now pt would like to get evaluated by pain clinic to explore options that can help her with her chronic pain. Release of information given in order to obtain information about her surgeries. Will place referral.

## 2013-04-13 NOTE — Progress Notes (Signed)
Family Medicine Office Visit Note   Subjective:   [name], female  DOB: 1961-04-10, 52 y.o.. MRN: [id number]    Pt that comes today to followup her depression. She has been on fluoxetine for about a month and reports feeling better. She denies any noticeable side effects. Denies anhedonia, lack of energy,  difficulty sleeping, suicidal thoughts or plans. Desires to continue this medication. She she is going to put bile study regularly and keeps himself busy at doing home renovations.  Back pain: Patient reports has history of 2 surgeries on her back in 1980s and 1990's. Since then she has intermittent back pain mostly thoracic, sometimes in lumbar area. This time the pain is reported to be thoracic with no radiation. Pain intensity is to 3/10 alleviates with ibuprofen and heat pad/brace. Denies difficulty breathing, chest pain or other symptoms.   She received disability in 2010 due to her back problems. The patient reported filled release of information for [place] to get more details about her surgeries that were done in [place]. Per 08/2011 MRI is reported as extensive artifact from the lumbar fusion hardware making evaluation of the lower lumbar spine quite difficult.  Pt is requesting referral to a back doctor in order to be evaluated for pain management options.  Review of Systems:  Per HPI  Objective:   Physical Exam: Gen:  NAD HEENT: Moist mucous membranes  CV: Regular rate and rhythm, no murmurs rubs or gallops PULM: Clear to auscultation bilaterally. No wheezes/rales/rhonchi ABD: Soft, non tender, non distended, normal bowel sounds EXT: No edema. Neuro: Alert and oriented x3. No focalization Back - Normal skin, no visual deformity.  No tenderness to vertebral process palpation. Range of motion is mildly limited due to reported pain. PSYCH: normal mood and affect. Normal speech. No agitation. No hallucinations.   Assessment & Plan:

## 2013-05-18 ENCOUNTER — Telehealth: Payer: Self-pay | Admitting: Family Medicine

## 2013-05-18 ENCOUNTER — Encounter (HOSPITAL_COMMUNITY): Payer: Self-pay | Admitting: Emergency Medicine

## 2013-05-18 ENCOUNTER — Emergency Department (HOSPITAL_COMMUNITY)
Admission: EM | Admit: 2013-05-18 | Discharge: 2013-05-18 | Disposition: A | Discharge status: Home / Self Care | Payer: Medicare Other | Attending: Emergency Medicine | Admitting: Emergency Medicine

## 2013-05-18 ENCOUNTER — Ambulatory Visit: Payer: Medicare Other | Admitting: Family Medicine

## 2013-05-18 DIAGNOSIS — Z87442 Personal history of urinary calculi: Secondary | ICD-10-CM | POA: Insufficient documentation

## 2013-05-18 DIAGNOSIS — Z87891 Personal history of nicotine dependence: Secondary | ICD-10-CM | POA: Insufficient documentation

## 2013-05-18 DIAGNOSIS — I10 Essential (primary) hypertension: Secondary | ICD-10-CM | POA: Insufficient documentation

## 2013-05-18 DIAGNOSIS — Z7982 Long term (current) use of aspirin: Secondary | ICD-10-CM | POA: Insufficient documentation

## 2013-05-18 DIAGNOSIS — F3289 Other specified depressive episodes: Secondary | ICD-10-CM | POA: Insufficient documentation

## 2013-05-18 DIAGNOSIS — F329 Major depressive disorder, single episode, unspecified: Secondary | ICD-10-CM | POA: Insufficient documentation

## 2013-05-18 DIAGNOSIS — Z8673 Personal history of transient ischemic attack (TIA), and cerebral infarction without residual deficits: Secondary | ICD-10-CM | POA: Insufficient documentation

## 2013-05-18 DIAGNOSIS — I252 Old myocardial infarction: Secondary | ICD-10-CM | POA: Insufficient documentation

## 2013-05-18 DIAGNOSIS — R51 Headache: Secondary | ICD-10-CM | POA: Insufficient documentation

## 2013-05-18 DIAGNOSIS — R011 Cardiac murmur, unspecified: Secondary | ICD-10-CM | POA: Insufficient documentation

## 2013-05-18 DIAGNOSIS — Z79899 Other long term (current) drug therapy: Secondary | ICD-10-CM | POA: Insufficient documentation

## 2013-05-18 DIAGNOSIS — F411 Generalized anxiety disorder: Secondary | ICD-10-CM | POA: Insufficient documentation

## 2013-05-18 LAB — CBC WITH DIFFERENTIAL/PLATELET
Eosinophils Relative: 2 % (ref 0–5)
HCT: 38.5 % (ref 36.0–46.0)
Hemoglobin: 13.4 g/dL (ref 12.0–15.0)
Lymphocytes Relative: 29 % (ref 12–46)
Lymphs Abs: 2.4 10*3/uL (ref 0.7–4.0)
MCV: 87.9 fL (ref 78.0–100.0)
Monocytes Absolute: 0.6 10*3/uL (ref 0.1–1.0)
Monocytes Relative: 8 % (ref 3–12)
RBC: 4.38 MIL/uL (ref 3.87–5.11)
RDW: 13.8 % (ref 11.5–15.5)
WBC: 8 10*3/uL (ref 4.0–10.5)

## 2013-05-18 LAB — BASIC METABOLIC PANEL
BUN: 18 mg/dL (ref 6–23)
CO2: 25 mEq/L (ref 19–32)
Calcium: 9.4 mg/dL (ref 8.4–10.5)
Creatinine, Ser: 0.49 mg/dL — ABNORMAL LOW (ref 0.50–1.10)
Glucose, Bld: 122 mg/dL — ABNORMAL HIGH (ref 70–99)

## 2013-05-18 LAB — URINALYSIS, ROUTINE W REFLEX MICROSCOPIC
Glucose, UA: NEGATIVE mg/dL
Hgb urine dipstick: NEGATIVE
Ketones, ur: NEGATIVE mg/dL
Nitrite: NEGATIVE
Protein, ur: NEGATIVE mg/dL

## 2013-05-18 MED ORDER — METOPROLOL TARTRATE 1 MG/ML IV SOLN
10.0000 mg | Freq: Once | INTRAVENOUS | Status: DC
  Filled 2013-05-18: qty 10

## 2013-05-18 MED ORDER — SODIUM CHLORIDE 0.9 % IV BOLUS (SEPSIS)
1000.0000 mL | Freq: Once | INTRAVENOUS | Status: AC
  Administered 2013-05-18: 1000 mL via INTRAVENOUS

## 2013-05-18 MED ORDER — DIPHENHYDRAMINE HCL 50 MG/ML IJ SOLN
25.0000 mg | Freq: Once | INTRAMUSCULAR | Status: AC
  Administered 2013-05-18: 25 mg via INTRAVENOUS
  Filled 2013-05-18: qty 1

## 2013-05-18 MED ORDER — HYDRALAZINE HCL 20 MG/ML IJ SOLN
10.0000 mg | Freq: Once | INTRAMUSCULAR | Status: AC
  Administered 2013-05-18: 10 mg via INTRAVENOUS
  Filled 2013-05-18: qty 1

## 2013-05-18 MED ORDER — ONDANSETRON HCL 4 MG/2ML IJ SOLN
4.0000 mg | Freq: Once | INTRAMUSCULAR | Status: AC
  Administered 2013-05-18: 4 mg via INTRAVENOUS
  Filled 2013-05-18: qty 2

## 2013-05-18 MED ORDER — DEXAMETHASONE SODIUM PHOSPHATE 4 MG/ML IJ SOLN
4.0000 mg | Freq: Once | INTRAMUSCULAR | Status: AC
  Administered 2013-05-18: 4 mg via INTRAVENOUS
  Filled 2013-05-18: qty 1

## 2013-05-18 NOTE — ED Provider Notes (Signed)
Medical screening examination/treatment/procedure(s) were performed by non-physician practitioner and as supervising physician I was immediately available for consultation/collaboration.    Junius Argyle, MD 05/18/13 1530

## 2013-05-18 NOTE — ED Notes (Signed)
Pt reports heaviness in her chest has decreased. Still present but not as bad as earlier.

## 2013-05-18 NOTE — ED Notes (Signed)
np at bedside

## 2013-05-18 NOTE — ED Provider Notes (Signed)
CSN: [id number]     Arrival date & time 05/18/13  6440 History   None    Chief Complaint  Patient presents with  . Headache  . Hypertension   (Consider location/radiation/quality/duration/timing/severity/associated sxs/prior Treatment) Patient is a 52 y.o. female presenting with headaches and hypertension. The history is provided by the patient. No language interpreter was used.  Headache Pain location:  Generalized Quality:  Dull Radiates to:  Does not radiate Onset quality:  Gradual Similar to prior headaches: yes   Context: not exposure to bright light, not coughing and not loud noise   Associated symptoms: no abdominal pain, no blurred vision, no cough, no diarrhea, no dizziness, no fever, no focal weakness, no loss of balance, no neck pain, no neck stiffness, no numbness, no paresthesias, no photophobia, no sinus pressure, no sore throat, no URI, no visual change, no vomiting and no weakness   Hypertension This is a chronic problem. The current episode started today. Associated symptoms include headaches. Pertinent negatives include no abdominal pain, chills, coughing, fever, neck pain, numbness, sore throat, visual change, vomiting or weakness.    pt is well-appearing 52 year old female who presents today with a history of a dull headache and elevated blood pressure. She reports that she has had a dull headache since lat night and has had elevated b/p this morning. She reports that she is taking her meds as she normally does. She denies fever, chills, shortness of breath, chest pain or recent illness or exposure. She has been eating and drinking and maintaining her normal activity level. She denies any dysuria or other urinary symptoms. No associated nausea, dizziness, vomiting, visual changes, photophobia, weakness, numbness, tingling or focal deficits.   Past Medical History  Diagnosis Date  . Anxiety   . Depression   . Heart murmur     Normal echo 2007  . Hypertension   .  Chest pain   . Neck pain   . History of kidney stones   . Stroke     mini stroke  . MI, old     Negative nuclear stress test 2007   Past Surgical History  Procedure Laterality Date  . Back surgery      x4, L4-5  . Tubal ligation     Family History  Problem Relation Age of Onset  . Heart disease Mother   . Stroke Mother   . Hypertension Mother   . Diabetes Mother   . Heart disease Father 55    Died age 4  . Heart attack Father   . Hypertension Father   . Hyperlipidemia Brother   . Cancer Maternal Aunt    History  Substance Use Topics  . Smoking status: Former Smoker -- 0.30 packs/day    Types: Cigarettes    Quit date: 04/15/2011  . Smokeless tobacco: Not on file     Comment: quitt x 3 weeks  . Alcohol Use: Not on file   OB History   Grav Para Term Preterm Abortions TAB SAB Ect Mult Living                 Review of Systems  Constitutional: Negative for fever and chills.  HENT: Negative for sinus pressure and sore throat.   Eyes: Negative for blurred vision and photophobia.  Respiratory: Negative for cough and shortness of breath.   Gastrointestinal: Negative for vomiting, abdominal pain and diarrhea.  Genitourinary: Negative for dysuria, urgency and frequency.  Musculoskeletal: Negative for neck pain and neck stiffness.  Neurological: Positive for headaches. Negative for dizziness, focal weakness, facial asymmetry, weakness, numbness, paresthesias and loss of balance.  All other systems reviewed and are negative.    Allergies  Review of patient's allergies indicates no known allergies.  Home Medications   Current Outpatient Rx  Name  Route  Sig  Dispense  Refill  . aspirin 325 MG tablet   Oral   Take 1 tablet (325 mg total) by mouth daily.   30 tablet   11   . FLUoxetine (PROZAC) 20 MG capsule   Oral   Take 1 capsule (20 mg total) by mouth daily.   30 capsule   3   . gabapentin (NEURONTIN) 800 MG tablet      Take one by mouth daily   30  tablet   6   . metoprolol tartrate (LOPRESSOR) 25 MG tablet   Oral   Take 1 tablet (25 mg total) by mouth 2 (two) times daily.   60 tablet   11   . Prenatal Multivit-Min-Fe-FA (PRE-NATAL PO)   Oral   Take 1 tablet by mouth daily.         . quinapril-hydrochlorothiazide (ACCURETIC) 20-25 MG per tablet   Oral   Take 1 tablet by mouth daily.   75 tablet   0   . EXPIRED: atorvastatin (LIPITOR) 20 MG tablet   Oral   Take 1 tablet (20 mg total) by mouth daily.   30 tablet   11     Please use generic if available   . EXPIRED: quinapril (ACCUPRIL) 20 MG tablet   Oral   Take 1 tablet (20 mg total) by mouth daily.   90 tablet   0    BP 214/76  Pulse 52  Temp(Src) 97.8 F (36.6 C) (Oral)  Resp 22  Ht 5' (1.524 m)  Wt 132 lb (59.875 kg)  BMI 25.78 kg/m2  SpO2 96% Physical Exam  Nursing note and vitals reviewed. Constitutional: She is oriented to person, place, and time. She appears well-developed and well-nourished. No distress.  HENT:  Head: Normocephalic and atraumatic.  Right Ear: External ear normal.  Left Ear: External ear normal.  Mouth/Throat: Oropharynx is clear and moist.  Eyes: Conjunctivae and EOM are normal. Pupils are equal, round, and reactive to light.  Neck: Normal range of motion. Neck supple. No tracheal deviation present. No thyromegaly present.  Cardiovascular: Normal rate, regular rhythm, normal heart sounds and intact distal pulses.   Pulmonary/Chest: Effort normal and breath sounds normal. No respiratory distress.  Abdominal: Soft. Bowel sounds are normal. There is no tenderness.  Musculoskeletal: Normal range of motion.  Neurological: She is alert and oriented to person, place, and time. She has normal strength. No cranial nerve deficit or sensory deficit. GCS eye subscore is 4. GCS verbal subscore is 5. GCS motor subscore is 6.  Skin: Skin is warm and dry.  Psychiatric: She has a normal mood and affect. Her behavior is normal. Judgment and  thought content normal.    ED Course  Procedures (including critical care time) Labs Review Labs Reviewed - No data to display Imaging Review No results found.  EKG Interpretation   None      2:18 PM BP 149/66  Pulse 64  Temp(Src) 97.9 F (36.6 C) (Oral)  Resp 20  Ht 5' (1.524 m)  Wt 132 lb (59.875 kg)  BMI 25.78 kg/m2  SpO2 97% Drinking oral fluids and eating graham crackers here, feeling better and ready for discharge. Ambulatory  in room before discharge without any deficits.   MDM   1. Headache    Complete resolution of headache and B/P decreased to baseline after meds for headache, IV fluid bolus and hydralazine x 1 dose. Pt denies any dizziness, weakness, numbness, loss of balance, visual changes or history of fever, stiff neck or recent illness. No changes in mental status. No focal deficits, neuro exam normal. Reassuring exam. Pt reports that she feels better after her stay here and agrees to go home and rest.    Irish Elders, NP 05/18/13 1418  Irish Elders, NP 05/18/13 1419

## 2013-05-18 NOTE — ED Notes (Signed)
Family at bedside. 

## 2013-05-18 NOTE — ED Notes (Signed)
Reports having headache that started yesterday, has been checking bp at home and reports being hypertensive, took bp meds this am. bp is 214/75 at triage, pts only complaints is "just not feeling well and headache." no acute distress noted at triage.

## 2013-05-18 NOTE — ED Notes (Signed)
Pt reports chest "feels heavy." Not painful but intense heaviness 15 mins after Hydralazine given. Given Benadryl, Zofran and Decadron and pt reports chest heaviness "is getting better."

## 2013-05-18 NOTE — ED Notes (Signed)
Tresa Endo, [place] informed of pt heart rate. Will hold metoprolol.

## 2013-05-19 ENCOUNTER — Telehealth: Payer: Self-pay | Admitting: Family Medicine

## 2013-05-19 ENCOUNTER — Encounter: Payer: Self-pay | Admitting: *Deleted

## 2013-05-19 DIAGNOSIS — I10 Essential (primary) hypertension: Secondary | ICD-10-CM

## 2013-05-19 NOTE — Telephone Encounter (Signed)
Pt called and needs refills on her quinapril and Norvasc sent to [facility] . She can no longer use the [facility] JW

## 2013-05-20 MED ORDER — AMLODIPINE BESYLATE 10 MG PO TABS: 10.0000 mg | ORAL_TABLET | Freq: Every day | ORAL | Status: DC

## 2013-05-20 MED ORDER — QUINAPRIL HCL 20 MG PO TABS: 20.0000 mg | ORAL_TABLET | Freq: Every day | ORAL | Status: DC

## 2013-05-20 NOTE — Addendum Note (Signed)
Addended by: Jimmy Footman K on: 05/20/2013 11:13 AM   Modules accepted: Orders

## 2013-05-20 NOTE — Telephone Encounter (Signed)
Amlodipine and Accupril sent to [facility]

## 2013-05-20 NOTE — Telephone Encounter (Signed)
Spoke with patient and per her request the rx have been sent to [facility] [address] [place]

## 2013-05-20 NOTE — Addendum Note (Signed)
Addended by: Jimmy Footman K on: 05/20/2013 12:02 PM   Modules accepted: Orders

## 2013-05-21 ENCOUNTER — Ambulatory Visit: Payer: Medicare Other | Admitting: Family Medicine

## 2013-05-25 ENCOUNTER — Emergency Department: Payer: Self-pay | Admitting: Emergency Medicine

## 2013-05-25 LAB — COMPREHENSIVE METABOLIC PANEL
Alkaline Phosphatase: 179 U/L — ABNORMAL HIGH (ref 50–136)
Anion Gap: 3 — ABNORMAL LOW (ref 7–16)
BUN: 13 mg/dL (ref 7–18)
Bilirubin,Total: 0.3 mg/dL (ref 0.2–1.0)
Calcium, Total: 9.8 mg/dL (ref 8.5–10.1)
Chloride: 105 mmol/L (ref 98–107)
Co2: 30 mmol/L (ref 21–32)
Creatinine: 0.63 mg/dL (ref 0.60–1.30)
Glucose: 119 mg/dL — ABNORMAL HIGH (ref 65–99)
Osmolality: 277 (ref 275–301)
SGPT (ALT): 39 U/L (ref 12–78)
Sodium: 138 mmol/L (ref 136–145)

## 2013-05-25 LAB — CBC
HCT: 40.5 % (ref 35.0–47.0)
MCHC: 35.4 g/dL (ref 32.0–36.0)
MCV: 87 fL (ref 80–100)
RBC: 4.66 10*6/uL (ref 3.80–5.20)
WBC: 11.9 10*3/uL — ABNORMAL HIGH (ref 3.6–11.0)

## 2013-06-14 ENCOUNTER — Other Ambulatory Visit: Payer: Self-pay | Admitting: Family Medicine

## 2013-06-14 DIAGNOSIS — Z1231 Encounter for screening mammogram for malignant neoplasm of breast: Secondary | ICD-10-CM

## 2013-06-24 ENCOUNTER — Ambulatory Visit: Payer: Medicare Other

## 2013-08-17 NOTE — Telephone Encounter (Signed)
This encounter was created in error - please disregard.

## 2013-12-08 NOTE — Telephone Encounter (Signed)
error 

## 2015-03-07 ENCOUNTER — Other Ambulatory Visit: Payer: Self-pay | Admitting: Family Medicine

## 2015-03-07 DIAGNOSIS — Z1231 Encounter for screening mammogram for malignant neoplasm of breast: Secondary | ICD-10-CM

## 2015-03-14 ENCOUNTER — Ambulatory Visit: Payer: Medicare Other

## 2017-12-12 DIAGNOSIS — R2230 Localized swelling, mass and lump, unspecified upper limb: Secondary | ICD-10-CM | POA: Insufficient documentation

## 2018-09-09 ENCOUNTER — Encounter (HOSPITAL_COMMUNITY): Payer: Self-pay | Admitting: Emergency Medicine

## 2018-09-09 ENCOUNTER — Other Ambulatory Visit: Payer: Self-pay

## 2018-09-09 ENCOUNTER — Emergency Department (HOSPITAL_COMMUNITY): Payer: Medicare Other

## 2018-09-09 ENCOUNTER — Observation Stay (HOSPITAL_COMMUNITY): Payer: Medicare Other

## 2018-09-09 ENCOUNTER — Observation Stay (HOSPITAL_COMMUNITY)
Admission: EM | Admit: 2018-09-09 | Discharge: 2018-09-11 | Disposition: A | Discharge status: Home / Self Care | Payer: Medicare Other | Attending: Internal Medicine | Admitting: Internal Medicine

## 2018-09-09 DIAGNOSIS — Z720 Tobacco use: Secondary | ICD-10-CM | POA: Diagnosis present

## 2018-09-09 DIAGNOSIS — I252 Old myocardial infarction: Secondary | ICD-10-CM | POA: Diagnosis not present

## 2018-09-09 DIAGNOSIS — F419 Anxiety disorder, unspecified: Secondary | ICD-10-CM | POA: Insufficient documentation

## 2018-09-09 DIAGNOSIS — Z8249 Family history of ischemic heart disease and other diseases of the circulatory system: Secondary | ICD-10-CM | POA: Insufficient documentation

## 2018-09-09 DIAGNOSIS — J439 Emphysema, unspecified: Secondary | ICD-10-CM | POA: Insufficient documentation

## 2018-09-09 DIAGNOSIS — E119 Type 2 diabetes mellitus without complications: Secondary | ICD-10-CM | POA: Diagnosis not present

## 2018-09-09 DIAGNOSIS — F329 Major depressive disorder, single episode, unspecified: Secondary | ICD-10-CM | POA: Insufficient documentation

## 2018-09-09 DIAGNOSIS — I700 Atherosclerosis of aorta: Secondary | ICD-10-CM | POA: Insufficient documentation

## 2018-09-09 DIAGNOSIS — Z79899 Other long term (current) drug therapy: Secondary | ICD-10-CM | POA: Diagnosis not present

## 2018-09-09 DIAGNOSIS — R079 Chest pain, unspecified: Secondary | ICD-10-CM | POA: Diagnosis present

## 2018-09-09 DIAGNOSIS — R0789 Other chest pain: Secondary | ICD-10-CM

## 2018-09-09 DIAGNOSIS — F17210 Nicotine dependence, cigarettes, uncomplicated: Secondary | ICD-10-CM | POA: Diagnosis not present

## 2018-09-09 DIAGNOSIS — I10 Essential (primary) hypertension: Secondary | ICD-10-CM | POA: Diagnosis present

## 2018-09-09 DIAGNOSIS — I25110 Atherosclerotic heart disease of native coronary artery with unstable angina pectoris: Principal | ICD-10-CM | POA: Insufficient documentation

## 2018-09-09 DIAGNOSIS — Z7982 Long term (current) use of aspirin: Secondary | ICD-10-CM | POA: Insufficient documentation

## 2018-09-09 DIAGNOSIS — E785 Hyperlipidemia, unspecified: Secondary | ICD-10-CM | POA: Diagnosis not present

## 2018-09-09 DIAGNOSIS — Z8673 Personal history of transient ischemic attack (TIA), and cerebral infarction without residual deficits: Secondary | ICD-10-CM | POA: Insufficient documentation

## 2018-09-09 DIAGNOSIS — I200 Unstable angina: Secondary | ICD-10-CM

## 2018-09-09 DIAGNOSIS — I639 Cerebral infarction, unspecified: Secondary | ICD-10-CM | POA: Diagnosis present

## 2018-09-09 DIAGNOSIS — F32A Depression, unspecified: Secondary | ICD-10-CM | POA: Diagnosis present

## 2018-09-09 HISTORY — DX: Type 2 diabetes mellitus without complications: E11.9

## 2018-09-09 LAB — CBC
HEMATOCRIT: 46.5 % — AB (ref 36.0–46.0)
Hemoglobin: 15.1 g/dL — ABNORMAL HIGH (ref 12.0–15.0)
MCH: 29.6 pg (ref 26.0–34.0)
MCHC: 32.5 g/dL (ref 30.0–36.0)
MCV: 91.2 fL (ref 80.0–100.0)
Platelets: 208 10*3/uL (ref 150–400)
RBC: 5.1 MIL/uL (ref 3.87–5.11)
RDW: 13.7 % (ref 11.5–15.5)
WBC: 8.4 10*3/uL (ref 4.0–10.5)
nRBC: 0 % (ref 0.0–0.2)

## 2018-09-09 LAB — BASIC METABOLIC PANEL
Anion gap: 13 (ref 5–15)
BUN: 16 mg/dL (ref 6–20)
CALCIUM: 10.5 mg/dL — AB (ref 8.9–10.3)
CO2: 27 mmol/L (ref 22–32)
CREATININE: 0.66 mg/dL (ref 0.44–1.00)
Chloride: 100 mmol/L (ref 98–111)
GFR calc Af Amer: >60 mL/min (ref >60)
GFR calc non Af Amer: >60 mL/min (ref >60)
Glucose, Bld: 120 mg/dL — ABNORMAL HIGH (ref 70–99)
Potassium: 3.7 mmol/L (ref 3.5–5.1)
SODIUM: 140 mmol/L (ref 135–145)

## 2018-09-09 LAB — I-STAT BETA HCG BLOOD, ED (MC, WL, AP ONLY): I-stat hCG, quantitative: 6.5 m[IU]/mL — ABNORMAL HIGH (ref <5)

## 2018-09-09 LAB — I-STAT TROPONIN, ED: Troponin i, poc: 0 ng/mL (ref 0.00–0.08)

## 2018-09-09 LAB — D-DIMER, QUANTITATIVE: D-Dimer, Quant: 0.59 ug/mL-FEU — ABNORMAL HIGH (ref 0.00–0.50)

## 2018-09-09 LAB — TROPONIN I: Troponin I: <0.03 ng/mL (ref <0.03)

## 2018-09-09 MED ORDER — ONDANSETRON HCL 4 MG/2ML IJ SOLN
4.0000 mg | Freq: Four times a day (QID) | INTRAMUSCULAR | Status: DC | PRN
  Administered 2018-09-09 – 2018-09-11 (×2): 4 mg via INTRAVENOUS
  Filled 2018-09-09 (×2): qty 2

## 2018-09-09 MED ORDER — ASPIRIN 81 MG PO CHEW
243.0000 mg | CHEWABLE_TABLET | Freq: Once | ORAL | Status: AC
  Administered 2018-09-09: 243 mg via ORAL
  Filled 2018-09-09: qty 3

## 2018-09-09 MED ORDER — ENOXAPARIN SODIUM 40 MG/0.4ML ~~LOC~~ SOLN
40.0000 mg | SUBCUTANEOUS | Status: DC
  Administered 2018-09-09 – 2018-09-10 (×2): 40 mg via SUBCUTANEOUS
  Filled 2018-09-09 (×2): qty 0.4

## 2018-09-09 MED ORDER — ASPIRIN 325 MG PO TABS
325.0000 mg | ORAL_TABLET | Freq: Every day | ORAL | Status: DC
  Administered 2018-09-10 – 2018-09-11 (×2): 325 mg via ORAL
  Filled 2018-09-09 (×2): qty 1

## 2018-09-09 MED ORDER — ATORVASTATIN CALCIUM 10 MG PO TABS
20.0000 mg | ORAL_TABLET | Freq: Every day | ORAL | Status: DC
  Administered 2018-09-10 – 2018-09-11 (×2): 20 mg via ORAL
  Filled 2018-09-09 (×2): qty 2

## 2018-09-09 MED ORDER — ACETAMINOPHEN 325 MG PO TABS
650.0000 mg | ORAL_TABLET | ORAL | Status: DC | PRN
  Administered 2018-09-11: 650 mg via ORAL
  Filled 2018-09-09 (×2): qty 2

## 2018-09-09 MED ORDER — NITROGLYCERIN 0.4 MG SL SUBL
0.4000 mg | SUBLINGUAL_TABLET | SUBLINGUAL | Status: AC | PRN
  Administered 2018-09-09 – 2018-09-11 (×3): 0.4 mg via SUBLINGUAL
  Filled 2018-09-09 (×2): qty 1

## 2018-09-09 MED ORDER — AMLODIPINE BESYLATE 10 MG PO TABS
10.0000 mg | ORAL_TABLET | Freq: Every day | ORAL | Status: DC
  Administered 2018-09-10 – 2018-09-11 (×2): 10 mg via ORAL
  Filled 2018-09-09 (×2): qty 1

## 2018-09-09 MED ORDER — IOHEXOL 300 MG/ML  SOLN: 75.0000 mL | Freq: Once | INTRAMUSCULAR | Status: DC | PRN

## 2018-09-09 MED ORDER — SODIUM CHLORIDE 0.9 % IV SOLN
INTRAVENOUS | Status: DC
  Administered 2018-09-10: 02:00:00 via INTRAVENOUS

## 2018-09-09 MED ORDER — HYDRALAZINE HCL 20 MG/ML IJ SOLN: 5.0000 mg | INTRAMUSCULAR | Status: DC | PRN

## 2018-09-09 MED ORDER — MORPHINE SULFATE (PF) 4 MG/ML IV SOLN
4.0000 mg | Freq: Once | INTRAVENOUS | Status: AC
  Administered 2018-09-09: 4 mg via INTRAVENOUS
  Filled 2018-09-09: qty 1

## 2018-09-09 MED ORDER — NICOTINE 21 MG/24HR TD PT24
21.0000 mg | MEDICATED_PATCH | Freq: Every day | TRANSDERMAL | Status: DC
  Administered 2018-09-09 – 2018-09-11 (×3): 21 mg via TRANSDERMAL
  Filled 2018-09-09 (×3): qty 1

## 2018-09-09 MED ORDER — FLUOXETINE HCL 20 MG PO CAPS
20.0000 mg | ORAL_CAPSULE | Freq: Every day | ORAL | Status: DC
  Administered 2018-09-10 – 2018-09-11 (×2): 20 mg via ORAL
  Filled 2018-09-09 (×2): qty 1

## 2018-09-09 MED ORDER — IOPAMIDOL (ISOVUE-370) INJECTION 76%
INTRAVENOUS | Status: AC
  Administered 2018-09-09: 75 mL
  Filled 2018-09-09: qty 100

## 2018-09-09 MED ORDER — CLONAZEPAM 0.5 MG PO TABS
0.5000 mg | ORAL_TABLET | Freq: Every day | ORAL | Status: DC | PRN
  Administered 2018-09-09: 0.5 mg via ORAL
  Filled 2018-09-09: qty 1

## 2018-09-09 MED ORDER — MORPHINE SULFATE (PF) 2 MG/ML IV SOLN
2.0000 mg | INTRAVENOUS | Status: DC | PRN
  Administered 2018-09-09 – 2018-09-11 (×4): 2 mg via INTRAVENOUS
  Filled 2018-09-09 (×4): qty 1

## 2018-09-09 MED ORDER — HYDROCHLOROTHIAZIDE 25 MG PO TABS
25.0000 mg | ORAL_TABLET | Freq: Every day | ORAL | Status: DC
  Administered 2018-09-10: 25 mg via ORAL
  Filled 2018-09-09: qty 1

## 2018-09-09 MED ORDER — SODIUM CHLORIDE 0.9% FLUSH: 3.0000 mL | Freq: Once | INTRAVENOUS | Status: DC

## 2018-09-09 NOTE — ED Provider Notes (Signed)
Medical screening examination/treatment/procedure(s) were conducted as a shared visit with non-physician practitioner(s) and myself.  I personally evaluated the patient during the encounter. Briefly, the patient is a 58 y.o. female with history of diabetes, hypertension, high cholesterol, TIA who presents to the ED with left-sided chest pain.  Pain is been on and off for the last several days.  Radiates to her left arm, left upper back.  She upon my evaluation has unremarkable vitals.  No fever.  Continues to feel pain with some shortness of breath as well.  No signs of volume overload on exam.  Has not had recent cardiac stress test but was negative in the past.  Patient had nitroglycerin prior to my evaluation with improvement of her pain.  EKG showed normal sinus rhythm.  No signs of new ischemic changes.  Initial troponin normal.  D-dimer 590, age adjusted wnl.  However, will obtain CT PE to further evaluate after discussion with hospitalist.  Patient with no pneumonia, pneumothorax, pleural effusion on chest x-ray.  Patient given morphine for pain as well.  Patient does have high heart score and will admit for further ACS rule out.  PE studies pending at time of admission to the hospitalist team.  Stable throughout my care.   EKG Interpretation  Date/Time:  Wednesday September 09 2018 17:40:25 EST Ventricular Rate:  98 PR Interval:  136 QRS Duration: 76 QT Interval:  354 QTC Calculation: 451 R Axis:   62 Text Interpretation:  Normal sinus rhythm Cannot rule out Anterior infarct , age undetermined Abnormal ECG Confirmed by Virgina Norfolk [phone]) on 09/09/2018 8:14:00 PM          Virgina Norfolk, DO 09/09/18 2150

## 2018-09-09 NOTE — ED Provider Notes (Signed)
[facility] EMERGENCY DEPARTMENT Provider Note   CSN: [id number] Arrival date & time: 09/09/18  1727    History   Chief Complaint Chief Complaint  Patient presents with  . Chest Pain    HPI [name] is a 58 y.o. female with a past medical history of DM 2, hypertension, high cholesterol, TIA/CVA, who presents today for evaluation of chest pain.  She reports that her chest pain started Sunday night, woke her up from her sleep, in the middle of her chest and radiates into the left sided arm and into her back.  She reports occasional nausea without vomiting.  She reports feeling short of breath since this pain started.  She denies any recent swelling.  She reports compliance with all of her medications.  Her pain is made worse with laying down and exertion.    She has not had a heart cath before.  She takes 81mg  asa at home. She reports feeling slightly light headed intermittently.      HPI  Past Medical History:  Diagnosis Date  . Anxiety   . Chest pain   . Depression   . Diabetes mellitus without complication (HCC)   . Heart murmur    Normal echo 2007  . History of kidney stones   . Hypertension   . MI, old    Negative nuclear stress test 2007  . Neck pain   . Stroke [facility])    mini stroke    Patient Active Problem List   Diagnosis Date Noted  . Painful intercourse 03/15/2013  . At risk for diabetes mellitus 08/13/2012  . Vertigo 06/26/2012  . Overweight(278.02) 01/09/2012  . CVA (cerebral infarction) 04/12/2011  . Tobacco abuse 02/07/2011  . Dyslipidemia 02/07/2011  . ANXIETY 12/01/2009  . DEPRESSION 12/01/2009  . HYPERTENSION, BENIGN ESSENTIAL 12/01/2009  . DIVERTICULOSIS OF COLON 12/01/2009  . RENAL CYST 12/01/2009  . BACK PAIN, LUMBAR, CHRONIC 12/01/2009    Past Surgical History:  Procedure Laterality Date  . BACK SURGERY     x4, L4-5  . TUBAL LIGATION       OB History   No obstetric history on file.      Home  Medications    Prior to Admission medications   Medication Sig Start Date End Date Taking? Authorizing Provider  amLODipine (NORVASC) 10 MG tablet Take 1 tablet (10 mg total) by mouth daily. 05/20/13   [place] Peaches, Donetta Potts, MD  aspirin 325 MG tablet Take 1 tablet (325 mg total) by mouth daily. 04/12/11   Rodolph Bong, MD  atorvastatin (LIPITOR) 20 MG tablet Take 1 tablet (20 mg total) by mouth daily. 03/14/11 03/13/12  Edd Arbour, MD  FLUoxetine (PROZAC) 20 MG capsule Take 1 capsule (20 mg total) by mouth daily. 03/15/13   [place] Peaches, Donetta Potts, MD  gabapentin (NEURONTIN) 800 MG tablet Take one by mouth daily 12/09/12   [place]da Peaches, [place], MD  metoprolol tartrate (LOPRESSOR) 25 MG tablet Take 1 tablet (25 mg total) by mouth 2 (two) times daily. 12/09/12 12/09/13  [place] Peaches, Donetta Potts, MD  Prenatal Multivit-Min-Fe-FA (PRE-NATAL PO) Take 1 tablet by mouth daily.    [provider]  quinapril (ACCUPRIL) 20 MG tablet Take 1 tablet (20 mg total) by mouth daily. 05/20/13 05/20/14  [place] Peaches, Donetta Potts, MD  quinapril-hydrochlorothiazide (ACCURETIC) 20-25 MG per tablet Take 1 tablet by mouth daily. 08/05/11 05/18/13  [place], Sarah T, MD    Family History  Family History  Problem Relation Age of Onset  . Heart disease Mother   . Stroke Mother   . Hypertension Mother   . Diabetes Mother   . Heart disease Father 3547       Died age 60  . Heart attack Father   . Hypertension Father   . Hyperlipidemia Brother   . Cancer Maternal Aunt     Social History Social History   Tobacco Use  . Smoking status: Current Every Day Smoker    Packs/day: 1.00    Types: Cigarettes    Last attempt to quit: 04/15/2011    Years since quitting: 7.4  . Smokeless tobacco: Never Used  Substance Use Topics  . Alcohol use: Not Currently  . Drug use: Yes    Types: Marijuana     Allergies   Patient has no known allergies.   Review of Systems Review of Systems    Constitutional: Positive for fatigue. Negative for chills and fever.  HENT: Negative for congestion.   Respiratory: Positive for chest tightness and shortness of breath.   Cardiovascular: Positive for chest pain. Negative for palpitations and leg swelling.  Gastrointestinal: Positive for nausea. Negative for abdominal pain, diarrhea and vomiting.  Genitourinary: Negative for dysuria.  Musculoskeletal: Positive for neck pain. Negative for back pain and neck stiffness.  Skin: Negative for color change, rash and wound.  Neurological: Negative for dizziness, weakness and headaches.  Psychiatric/Behavioral: Negative for confusion.  All other systems reviewed and are negative.    Physical Exam Updated Vital Signs BP (!) 165/73   Pulse (!) 58   Temp 97.9 F (36.6 C) (Oral)   Resp 19   Ht 5' (1.524 m)   Wt 54.4 kg   LMP  (Exact Date)   SpO2 95%   BMI 23.44 kg/m   Physical Exam Vitals signs and nursing note reviewed.  Constitutional:      General: She is not in acute distress.    Appearance: She is well-developed.  HENT:     Head: Normocephalic and atraumatic.     Right Ear: Tympanic membrane, ear canal and external ear normal.     Left Ear: Tympanic membrane, ear canal and external ear normal.     Mouth/Throat:     Mouth: Mucous membranes are moist.     Pharynx: Oropharynx is clear. Uvula midline.  Eyes:     Conjunctiva/sclera: Conjunctivae normal.     Pupils: Pupils are equal, round, and reactive to light.  Neck:     Musculoskeletal: Full passive range of motion without pain, normal range of motion and neck supple. Normal range of motion. No neck rigidity or spinous process tenderness.     Vascular: No JVD.     Trachea: No tracheal deviation.  Cardiovascular:     Rate and Rhythm: Normal rate and regular rhythm.     Pulses:          Radial pulses are 2+ on the right side and 2+ on the left side.       Dorsalis pedis pulses are 2+ on the right side and 2+ on the left side.        Posterior tibial pulses are 2+ on the right side and 2+ on the left side.     Heart sounds: Normal heart sounds. No murmur.  Pulmonary:     Effort: Pulmonary effort is normal. No accessory muscle usage or respiratory distress.     Breath sounds: Normal breath sounds. No decreased breath sounds,  wheezing, rhonchi or rales.  Chest:     Chest wall: No tenderness.  Abdominal:     Palpations: Abdomen is soft.     Tenderness: There is no abdominal tenderness.  Skin:    General: Skin is warm and dry.  Neurological:     General: No focal deficit present.     Mental Status: She is alert.  Psychiatric:        Mood and Affect: Mood normal.        Behavior: Behavior normal.      ED Treatments / Results  Labs (all labs ordered are listed, but only abnormal results are displayed) Labs Reviewed  BASIC METABOLIC PANEL - Abnormal; Notable for the following components:      Result Value   Glucose, Bld 120 (*)    Calcium 10.5 (*)    All other components within normal limits  CBC - Abnormal; Notable for the following components:   Hemoglobin 15.1 (*)    HCT 46.5 (*)    All other components within normal limits  D-DIMER, QUANTITATIVE (NOT AT [facility]) - Abnormal; Notable for the following components:   D-Dimer, Quant 0.59 (*)    All other components within normal limits  I-STAT BETA HCG BLOOD, ED ([facility], [facility], [facility] ONLY) - Abnormal; Notable for the following components:   I-stat hCG, quantitative 6.5 (*)    All other components within normal limits  I-STAT TROPONIN, ED    EKG EKG Interpretation  Date/Time:  Wednesday September 09 2018 17:40:25 EST Ventricular Rate:  98 PR Interval:  136 QRS Duration: 76 QT Interval:  354 QTC Calculation: 451 R Axis:   62 Text Interpretation:  Normal sinus rhythm Cannot rule out Anterior infarct , age undetermined Abnormal ECG Confirmed by Virgina Norfolk [phone]) on 09/09/2018 8:14:00 PM   Radiology Dg Chest 2 View  Result Date: 09/09/2018 CLINICAL  DATA:  Chest pain beginning 3 days ago. EXAM: CHEST - 2 VIEW COMPARISON:  None. FINDINGS: The heart size and mediastinal contours are within normal limits. Both lungs are clear. The visualized skeletal structures are unremarkable. IMPRESSION: Negative two view chest x-ray Electronically Signed   By: Marin Roberts M.D.   On: 09/09/2018 18:46    Procedures Procedures (including critical care time)  Medications Ordered in ED Medications  sodium chloride flush (NS) 0.9 % injection 3 mL (has no administration in time range)  nitroGLYCERIN (NITROSTAT) SL tablet 0.4 mg (0.4 mg Sublingual Given 09/09/18 2034)  aspirin chewable tablet 243 mg (243 mg Oral Given 09/09/18 2013)  morphine 4 MG/ML injection 4 mg (4 mg Intravenous Given 09/09/18 2109)     Initial Impression / Assessment and Plan / ED Course  I have reviewed the triage vital signs and the nursing notes.  Pertinent labs & imaging results that were available during my care of the patient were reviewed by me and considered in my medical decision making (see chart for details).  Clinical Course as of Sep 09 2138  Wed Sep 09, 2018  2115 Age adjusted normal.   D-Dimer, Quant(!): 0.59 [EH]  2122 Patient's pain was relieved with nitro.    [EH]  2138 Spoke with Dr. Clyde Lundborg who will admit patient.    [EH]    Clinical Course User Index [EH] Cristina Gong, PA-C      Concern for cardiac etiology of Chest Pain. Cardiology has been consulted and will see patient in the ED for likely admit. Pt does not meet criteria for CP protocol and  a further evaluation is recommended. Pt has been re-evaluated prior to consult and VSS, NAD, heart RRR, pain 0/10, lungs CTAB. No acute abnormalities found on EKG and first round of cardiac enzymes negative. Her d-dimer is age adjusted normal. This case was discussed with Dr. Lockie Mola who has seen the patient and agrees with plan to admit. Patient has heart score of 4, significant risk factors include  questionable history of previous MI, history of TIA, hypertension, hyperlipidemia, smoking.  Her pain is classic in nature in that it radiates down her left arm and up the left side of her neck.   Her pain was treated with nitro in the ED, after which her pain improved.   I spoke with dr. Clyde Lundborg who agreed to admit patient..  This patient was seen as a shared visit with Dr. Lockie Mola.   The patient appears reasonably stabilized for admission considering the current resources, flow, and capabilities available in the ED at this time, and I doubt any other [facility] requiring further screening and/or treatment in the ED prior to admission.   Final Clinical Impressions(s) / ED Diagnoses   Final diagnoses:  Atypical chest pain    ED Discharge Orders    None       Norman Clay 09/09/18 2140    Virgina Norfolk, DO 09/09/18 2226

## 2018-09-09 NOTE — ED Triage Notes (Signed)
Pt reports chest pain that started on Sunday night. Pt reports radiation to her left arm and back.

## 2018-09-09 NOTE — ED Notes (Signed)
Patient transported to CT 

## 2018-09-09 NOTE — H&P (Signed)
History and Physical    [name] DOB: 15-Oct-1960 DOA: 09/09/2018  Referring MD/NP/PA:   PCP: Armando Gang, FNP   Patient coming from:  The patient is coming from home.  At baseline, pt is independent for most of ADL.        Chief Complaint: chest pain  HPI: [name] is a 58 y.o. female with medical history significant of hypertension, hyperlipidemia, diet controled diabetes mellitus, stroke, depression, anxiety, tobacco abuse, who presents with chest pain.  Patient states that she has been having intermittent chest pain in the past several days.  The chest pain is located in the left side of the chest, 8 out of 10 in severity when it is on, currently chest pain-free, dull, radiating to left shoulder and left neck areas.  It is not pleuritic.  No recent long-distance traveling.  No tenderness in the calf areas.  Patient has mild cough and shortness of breath.  Patient has nausea, but no vomiting, diarrhea or abdominal pain.  No symptoms for UTI.  Denies fever or chills.  This patient used to be seen by family medicine teaching service, but was not seen since year 2014. She has not changed her primary care doctor.  ED Course: pt was found to have negative troponin, WBC 8.4, d-dimer +0.59, electrolytes renal function okay, temperature normal, bradycardia, oxygen saturation 95% on room air, negative chest x-ray.  Patient is placed on telemetry bed for observation.  Review of Systems:   General: no fevers, chills, no body weight gain, has fatigue HEENT: no blurry vision, hearing changes or sore throat Respiratory: has dyspnea, coughing, no wheezing CV: no chest pain, no palpitations GI: has nausea, no vomiting, abdominal pain, diarrhea, constipation GU: no dysuria, burning on urination, increased urinary frequency, hematuria  Ext: no leg edema Neuro: no unilateral weakness, numbness, or tingling, no vision change or hearing loss Skin: no rash, no skin  tear. MSK: No muscle spasm, no deformity, no limitation of range of movement in spin Heme: No easy bruising.  Travel history: No recent long distant travel.  Allergy: No Known Allergies  Past Medical History:  Diagnosis Date  . Anxiety   . Chest pain   . Depression   . Diabetes mellitus without complication (HCC)   . Heart murmur    Normal echo 2007  . History of kidney stones   . Hypertension   . MI, old    Negative nuclear stress test 2007  . Neck pain   . Stroke [facility])    mini stroke    Past Surgical History:  Procedure Laterality Date  . BACK SURGERY     x4, L4-5  . TUBAL LIGATION      Social History:  reports that she has been smoking cigarettes. She has been smoking about 1.00 pack per day. She has never used smokeless tobacco. She reports previous alcohol use. She reports current drug use. Drug: Marijuana.  Family History:  Family History  Problem Relation Age of Onset  . Heart disease Mother   . Stroke Mother   . Hypertension Mother   . Diabetes Mother   . Heart disease Father 69       Died age 60  . Heart attack Father   . Hypertension Father   . Hyperlipidemia Brother   . Cancer Maternal Aunt      Prior to Admission medications   Medication Sig Start Date End Date Taking? Authorizing Provider  amLODipine (NORVASC) 10 MG tablet Take 1  tablet (10 mg total) by mouth daily. 05/20/13   [place] Pe[place], Donetta Pottsayarmys, MD  aspirin 325 MG tablet Take 1 tablet (325 mg total) by mouth daily. 04/12/11   Rodolph Bongorey, Evan S, MD  atorvastatin (LIPITOR) 20 MG tablet Take 1 tablet (20 mg total) by mouth daily. 03/14/11 03/13/12  Edd Arbourrton, Jonathan, MD  FLUoxetine (PROZAC) 20 MG capsule Take 1 capsule (20 mg total) by mouth daily. 03/15/13   [place], Donetta Pottsayarmys, MD  gabapentin (NEURONTIN) 800 MG tablet Take one by mouth daily 12/09/12   [place] Peaches[place], ElroyDayarmys, MD  metoprolol tartrate (LOPRESSOR) 25 MG tablet Take 1 tablet (25 mg total) by mouth 2 (two) times daily. 12/09/12  12/09/13  [place] Pe[place], Donetta Pottsayarmys, MD  Prenatal Multivit-Min-Fe-FA (PRE-NATAL PO) Take 1 tablet by mouth daily.    [provider]  quinapril (ACCUPRIL) 20 MG tablet Take 1 tablet (20 mg total) by mouth daily. 05/20/13 05/20/14  [place] Peaches[place], Donetta Pottsayarmys, MD  quinapril-hydrochlorothiazide (ACCURETIC) 20-25 MG per tablet Take 1 tablet by mouth daily. 08/05/11 05/18/13  [place], Sarah T, MD    Physical Exam: Vitals:   09/09/18 2200 09/09/18 2340 09/09/18 2340 09/10/18 0135  BP: (!) 179/92  (!) 147/55 140/64  Pulse: 79 (!) 58 (!) 58 (!) 57  Resp: (!) 21 16 16 18   Temp:    97.6 F (36.4 C)  TempSrc:    Oral  SpO2: 97% 96% 100% 98%  Weight:    52.9 kg  Height:    5' (1.524 m)   General: Not in acute distress HEENT:       Eyes: PERRL, EOMI, no scleral icterus.       ENT: No discharge from the ears and nose, no pharynx injection, no tonsillar enlargement.        Neck: No JVD, no bruit, no mass felt. Heme: No neck lymph node enlargement. Cardiac: S1/S2, RRR, No murmurs, No gallops or rubs. Respiratory: No rales, wheezing, rhonchi or rubs. GI: Soft, nondistended, nontender, no rebound pain, no organomegaly, BS present. GU: No hematuria Ext: No pitting leg edema bilaterally. 2+DP/PT pulse bilaterally. Musculoskeletal: No joint deformities, No joint redness or warmth, no limitation of ROM in spin. Skin: No rashes.  Neuro: Alert, oriented X3, cranial nerves II-XII grossly intact, moves all extremities normally.  Psych: Patient is not psychotic, no suicidal or hemocidal ideation.  Labs on Admission: I have personally reviewed following labs and imaging studies  CBC: Recent Labs  Lab 09/09/18 1745  WBC 8.4  HGB 15.1*  HCT 46.5*  MCV 91.2  PLT 208   Basic Metabolic Panel: Recent Labs  Lab 09/09/18 1745  NA 140  K 3.7  CL 100  CO2 27  GLUCOSE 120*  BUN 16  CREATININE 0.66  CALCIUM 10.5*   GFR: Estimated Creatinine Clearance: 54.4 mL/min (by C-G formula based on  SCr of 0.66 mg/dL). Liver Function Tests: No results for input(s): AST, ALT, ALKPHOS, BILITOT, PROT, ALBUMIN in the last 168 hours. No results for input(s): LIPASE, AMYLASE in the last 168 hours. No results for input(s): AMMONIA in the last 168 hours. Coagulation Profile: No results for input(s): INR, PROTIME in the last 168 hours. Cardiac Enzymes: Recent Labs  Lab 09/09/18 2219  TROPONINI <0.03   BNP (last 3 results) No results for input(s): PROBNP in the last 8760 hours. HbA1C: No results for input(s): HGBA1C in the last 72 hours. CBG: Recent Labs  Lab 09/10/18 0136  GLUCAP 99  Lipid Profile: No results for input(s): CHOL, HDL, LDLCALC, TRIG, CHOLHDL, LDLDIRECT in the last 72 hours. Thyroid Function Tests: No results for input(s): TSH, T4TOTAL, FREET4, T3FREE, THYROIDAB in the last 72 hours. Anemia Panel: No results for input(s): VITAMINB12, FOLATE, FERRITIN, TIBC, IRON, RETICCTPCT in the last 72 hours. Urine analysis:    Component Value Date/Time   COLORURINE YELLOW 05/18/2013 1120   APPEARANCEUR CLOUDY (A) 05/18/2013 1120   LABSPEC 1.017 05/18/2013 1120   PHURINE 6.5 05/18/2013 1120   GLUCOSEU NEGATIVE 05/18/2013 1120   HGBUR NEGATIVE 05/18/2013 1120   BILIRUBINUR NEGATIVE 05/18/2013 1120   KETONESUR NEGATIVE 05/18/2013 1120   PROTEINUR NEGATIVE 05/18/2013 1120   UROBILINOGEN 0.2 05/18/2013 1120   NITRITE NEGATIVE 05/18/2013 1120   LEUKOCYTESUR NEGATIVE 05/18/2013 1120   Sepsis Labs: @LABRCNTIP (procalcitonin:4,lacticidven:4) )No results found for this or any previous visit (from the past 240 hour(s)).   Radiological Exams on Admission: Dg Chest 2 View  Result Date: 09/09/2018 CLINICAL DATA:  Chest pain beginning 3 days ago. EXAM: CHEST - 2 VIEW COMPARISON:  None. FINDINGS: The heart size and mediastinal contours are within normal limits. Both lungs are clear. The visualized skeletal structures are unremarkable. IMPRESSION: Negative two view chest x-ray  Electronically Signed   By: Marin Roberts M.D.   On: 09/09/2018 18:46   Ct Angio Chest Pe W Or Wo Contrast  Result Date: 09/09/2018 CLINICAL DATA:  58 y/o F; chest pain radiating into the left arm and back for 3 days. Positive D-dimer. EXAM: CT ANGIOGRAPHY CHEST WITH CONTRAST TECHNIQUE: Multidetector CT imaging of the chest was performed using the standard protocol during bolus administration of intravenous contrast. Multiplanar CT image reconstructions and MIPs were obtained to evaluate the vascular anatomy. CONTRAST:  68 cc Isovue 370 COMPARISON:  09/09/2018 chest radiograph FINDINGS: Cardiovascular: Satisfactory opacification of the pulmonary arteries to the segmental level. No evidence of pulmonary embolism. Normal heart size. No pericardial effusion. Aortic and coronary artery calcific atherosclerosis. Mediastinum/Nodes: No enlarged mediastinal, hilar, or axillary lymph nodes. Thyroid gland, trachea, and esophagus demonstrate no significant findings. Lungs/Pleura: Paraseptal emphysema with upper lobe predominance. No consolidation, effusion, or pneumothorax. Upper Abdomen: No acute abnormality. Musculoskeletal: No chest wall abnormality. No acute or significant osseous findings. Review of the MIP images confirms the above findings. IMPRESSION: 1. No pulmonary embolus identified. 2. Paraseptal emphysema with upper lobe predominance. 3. Aortic and coronary artery calcific atherosclerosis. Emphysema (ICD10-J43.9). Electronically Signed   By: Mitzi Hansen M.D.   On: 09/09/2018 22:45     EKG: Independently reviewed.  Sinus rhythm, QTC 451, LAE, mild ST depression in the inferior leads and V4-V6.  Assessment/Plan Principal Problem:   Chest pain Active Problems:   Depression   Tobacco abuse   Stroke (HCC)   HLD (hyperlipidemia)   HTN (hypertension)   Chest pain: D-dimer positive, but a CT angiogram is negative for PE. Initial troponin negative, given multiple risk factors, will  to rule out ACS.  - will place on Tele bed for obs - cycle CE q6 x3 and repeat EKG in the am  - prn Nitroglycerin, Morphine, and aspirin, lipitor, metoprolol - Risk factor stratification: will check FLP and A1C, UDS - did not order 2d echo--> please reevaluate pt in AM to decide if pt needs 2D echo. - inpt card consult was requested via Epic  HTN:  -Continue home medications: Amlodipine, HCTZ -IV hydralazine prn  HLD (hyperlipidemia): -lipitor  Hx of Stroke: -on asa and lipitor  Depression: Stable, no suicidal or homicidal  ideations. -Continue home medications: Prozac and Klonopin  Tobacco abuse: -Did counseling about importance of quitting smoking -Nicotine patch   DVT ppx:   SQ Lovenox Code Status: Full code Family Communication: None at bed side.     Disposition Plan:  Anticipate discharge back to previous home environment Consults called:  none Admission status: Obs / tele      Date of Service 09/10/2018    Lorretta HarpXilin Sharai Overbay [facility]   If 7PM-7AM, please contact night-coverage [email or web address] Password [email or web address] 09/10/2018, 2:28 AM

## 2018-09-10 ENCOUNTER — Observation Stay (HOSPITAL_BASED_OUTPATIENT_CLINIC_OR_DEPARTMENT_OTHER): Payer: Medicare Other

## 2018-09-10 ENCOUNTER — Encounter: Payer: Self-pay | Admitting: Cardiovascular Disease

## 2018-09-10 ENCOUNTER — Other Ambulatory Visit (HOSPITAL_COMMUNITY): Payer: Medicare Other

## 2018-09-10 DIAGNOSIS — I25110 Atherosclerotic heart disease of native coronary artery with unstable angina pectoris: Secondary | ICD-10-CM | POA: Diagnosis not present

## 2018-09-10 DIAGNOSIS — I10 Essential (primary) hypertension: Secondary | ICD-10-CM | POA: Diagnosis not present

## 2018-09-10 DIAGNOSIS — I200 Unstable angina: Secondary | ICD-10-CM

## 2018-09-10 DIAGNOSIS — R079 Chest pain, unspecified: Secondary | ICD-10-CM

## 2018-09-10 DIAGNOSIS — Z720 Tobacco use: Secondary | ICD-10-CM | POA: Diagnosis not present

## 2018-09-10 DIAGNOSIS — E785 Hyperlipidemia, unspecified: Secondary | ICD-10-CM | POA: Diagnosis not present

## 2018-09-10 DIAGNOSIS — F329 Major depressive disorder, single episode, unspecified: Secondary | ICD-10-CM

## 2018-09-10 DIAGNOSIS — E119 Type 2 diabetes mellitus without complications: Secondary | ICD-10-CM | POA: Diagnosis not present

## 2018-09-10 LAB — GLUCOSE, CAPILLARY
Glucose-Capillary: 99 mg/dL (ref 70–99)
Glucose-Capillary: 102 mg/dL — ABNORMAL HIGH (ref 70–99)
Glucose-Capillary: 97 mg/dL (ref 70–99)
Glucose-Capillary: 94 mg/dL (ref 70–99)
Glucose-Capillary: 85 mg/dL (ref 70–99)
Glucose-Capillary: 146 mg/dL — ABNORMAL HIGH (ref 70–99)

## 2018-09-10 LAB — ECHOCARDIOGRAM COMPLETE
Height: 60 in
WEIGHTICAEL: 1865.97 [oz_av]

## 2018-09-10 LAB — LIPID PANEL
Cholesterol: 190 mg/dL (ref 0–200)
HDL: 59 mg/dL (ref >40)
LDL Cholesterol: 112 mg/dL — ABNORMAL HIGH (ref 0–99)
Total CHOL/HDL Ratio: 3.2 RATIO
Triglycerides: 93 mg/dL (ref <150)
VLDL: 19 mg/dL (ref 0–40)

## 2018-09-10 LAB — TROPONIN I
Troponin I: <0.03 ng/mL (ref <0.03)
Troponin I: <0.03 ng/mL (ref <0.03)

## 2018-09-10 LAB — HIV ANTIBODY (ROUTINE TESTING W REFLEX): HIV SCREEN 4TH GENERATION: NONREACTIVE

## 2018-09-10 MED ORDER — ALUM & MAG HYDROXIDE-SIMETH 200-200-20 MG/5ML PO SUSP
15.0000 mL | Freq: Four times a day (QID) | ORAL | Status: DC | PRN
  Administered 2018-09-10: 15 mL via ORAL
  Filled 2018-09-10: qty 30

## 2018-09-10 MED ORDER — NITROGLYCERIN 2 % TD OINT
0.5000 [in_us] | TOPICAL_OINTMENT | Freq: Four times a day (QID) | TRANSDERMAL | Status: DC
  Administered 2018-09-10 – 2018-09-11 (×5): 0.5 [in_us] via TOPICAL
  Filled 2018-09-10 (×6): qty 1

## 2018-09-10 MED ORDER — ZOLPIDEM TARTRATE 5 MG PO TABS
5.0000 mg | ORAL_TABLET | Freq: Every evening | ORAL | Status: DC | PRN
  Administered 2018-09-10: 5 mg via ORAL
  Filled 2018-09-10: qty 1

## 2018-09-10 MED ORDER — ASPIRIN 81 MG PO CHEW: 81.0000 mg | CHEWABLE_TABLET | ORAL | Status: DC
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
## 2018-09-11 ENCOUNTER — Ambulatory Visit (HOSPITAL_COMMUNITY)
Admission: EM | Disposition: A | Discharge status: Home / Self Care | Payer: Self-pay | Source: Home / Self Care | Attending: Emergency Medicine

## 2018-09-11 DIAGNOSIS — I10 Essential (primary) hypertension: Secondary | ICD-10-CM

## 2018-09-11 DIAGNOSIS — I2510 Atherosclerotic heart disease of native coronary artery without angina pectoris: Secondary | ICD-10-CM

## 2018-09-11 DIAGNOSIS — F329 Major depressive disorder, single episode, unspecified: Secondary | ICD-10-CM | POA: Diagnosis not present

## 2018-09-11 DIAGNOSIS — R0789 Other chest pain: Secondary | ICD-10-CM

## 2018-09-11 DIAGNOSIS — I639 Cerebral infarction, unspecified: Secondary | ICD-10-CM

## 2018-09-11 DIAGNOSIS — R079 Chest pain, unspecified: Secondary | ICD-10-CM | POA: Diagnosis not present

## 2018-09-11 HISTORY — PX: LEFT HEART CATH AND CORONARY ANGIOGRAPHY: CATH118249

## 2018-09-11 LAB — CBC
HCT: 43.5 % (ref 36.0–46.0)
Hemoglobin: 14.2 g/dL (ref 12.0–15.0)
MCH: 29.8 pg (ref 26.0–34.0)
MCHC: 32.6 g/dL (ref 30.0–36.0)
MCV: 91.4 fL (ref 80.0–100.0)
Platelets: 168 10*3/uL (ref 150–400)
RBC: 4.76 MIL/uL (ref 3.87–5.11)
RDW: 13.4 % (ref 11.5–15.5)
WBC: 5.6 10*3/uL (ref 4.0–10.5)
nRBC: 0 % (ref 0.0–0.2)

## 2018-09-11 LAB — GLUCOSE, CAPILLARY: Glucose-Capillary: 110 mg/dL — ABNORMAL HIGH (ref 70–99)

## 2018-09-11 LAB — BASIC METABOLIC PANEL
Anion gap: 8 (ref 5–15)
BUN: 19 mg/dL (ref 6–20)
CO2: 27 mmol/L (ref 22–32)
Calcium: 9.4 mg/dL (ref 8.9–10.3)
Chloride: 105 mmol/L (ref 98–111)
Creatinine, Ser: 0.61 mg/dL (ref 0.44–1.00)
GFR calc Af Amer: >60 mL/min (ref >60)
GFR calc non Af Amer: >60 mL/min (ref >60)
Glucose, Bld: 114 mg/dL — ABNORMAL HIGH (ref 70–99)
Potassium: 3.4 mmol/L — ABNORMAL LOW (ref 3.5–5.1)
Sodium: 140 mmol/L (ref 135–145)

## 2018-09-11 LAB — HEMOGLOBIN A1C
Hgb A1c MFr Bld: 6.4 % — ABNORMAL HIGH (ref 4.8–5.6)
Mean Plasma Glucose: 137 mg/dL

## 2018-09-11 SURGERY — LEFT HEART CATH AND CORONARY ANGIOGRAPHY
Anesthesia: LOCAL

## 2018-09-11 MED ORDER — HEPARIN SODIUM (PORCINE) 1000 UNIT/ML IJ SOLN
INTRAMUSCULAR | Status: AC
  Filled 2018-09-11: qty 1

## 2018-09-11 MED ORDER — VERAPAMIL HCL 2.5 MG/ML IV SOLN
INTRAVENOUS | Status: DC | PRN
  Administered 2018-09-11: 10 mL via INTRA_ARTERIAL

## 2018-09-11 MED ORDER — SODIUM CHLORIDE 0.9% FLUSH: 3.0000 mL | INTRAVENOUS | Status: DC | PRN

## 2018-09-11 MED ORDER — HEPARIN (PORCINE) IN NACL 1000-0.9 UT/500ML-% IV SOLN
INTRAVENOUS | Status: DC | PRN
  Administered 2018-09-11 (×2): 500 mL

## 2018-09-11 MED ORDER — SODIUM CHLORIDE 0.9 % IV SOLN: 250.0000 mL | INTRAVENOUS | Status: DC | PRN

## 2018-09-11 MED ORDER — ACETAMINOPHEN 325 MG PO TABS: 650.0000 mg | ORAL_TABLET | ORAL | Status: DC | PRN

## 2018-09-11 MED ORDER — POTASSIUM CHLORIDE CRYS ER 20 MEQ PO TBCR
40.0000 meq | EXTENDED_RELEASE_TABLET | Freq: Once | ORAL | Status: AC
  Administered 2018-09-11: 40 meq via ORAL
  Filled 2018-09-11: qty 2

## 2018-09-11 MED ORDER — OXYCODONE HCL 5 MG PO TABS: 5.0000 mg | ORAL_TABLET | ORAL | Status: DC | PRN

## 2018-09-11 MED ORDER — IOHEXOL 350 MG/ML SOLN
INTRAVENOUS | Status: DC | PRN
  Administered 2018-09-11: 60 mL via INTRA_ARTERIAL

## 2018-09-11 MED ORDER — SODIUM CHLORIDE 0.9 % WEIGHT BASED INFUSION: 1.0000 mL/kg/h | INTRAVENOUS | Status: DC

## 2018-09-11 MED ORDER — LIDOCAINE HCL (PF) 1 % IJ SOLN
INTRAMUSCULAR | Status: AC
  Filled 2018-09-11: qty 30

## 2018-09-11 MED ORDER — SODIUM CHLORIDE 0.9 % WEIGHT BASED INFUSION
1.0000 mL/kg/h | INTRAVENOUS | Status: DC
  Administered 2018-09-11: 1 mL/kg/h via INTRAVENOUS

## 2018-09-11 MED ORDER — HEPARIN SODIUM (PORCINE) 1000 UNIT/ML IJ SOLN
INTRAMUSCULAR | Status: DC | PRN
  Administered 2018-09-11: 3000 [IU] via INTRAVENOUS

## 2018-09-11 MED ORDER — GABAPENTIN 300 MG PO CAPS: 300.0000 mg | ORAL_CAPSULE | Freq: Three times a day (TID) | ORAL | 0 refills | Status: DC | PRN

## 2018-09-11 MED ORDER — LISINOPRIL 5 MG PO TABS
5.0000 mg | ORAL_TABLET | Freq: Every day | ORAL | Status: DC
  Administered 2018-09-11: 5 mg via ORAL
  Filled 2018-09-11: qty 1

## 2018-09-11 MED ORDER — FENTANYL CITRATE (PF) 100 MCG/2ML IJ SOLN
INTRAMUSCULAR | Status: DC | PRN
  Administered 2018-09-11 (×2): 25 ug via INTRAVENOUS

## 2018-09-11 MED ORDER — MIDAZOLAM HCL 2 MG/2ML IJ SOLN
INTRAMUSCULAR | Status: AC
  Filled 2018-09-11: qty 2

## 2018-09-11 MED ORDER — HEPARIN SODIUM (PORCINE) 5000 UNIT/ML IJ SOLN: 5000.0000 [IU] | Freq: Three times a day (TID) | INTRAMUSCULAR | Status: DC

## 2018-09-11 MED ORDER — SODIUM CHLORIDE 0.9% FLUSH: 3.0000 mL | Freq: Two times a day (BID) | INTRAVENOUS | Status: DC

## 2018-09-11 MED ORDER — NICOTINE 21 MG/24HR TD PT24: 21.0000 mg | MEDICATED_PATCH | Freq: Every day | TRANSDERMAL | 0 refills | Status: DC

## 2018-09-11 MED ORDER — SODIUM CHLORIDE 0.9 % WEIGHT BASED INFUSION: 3.0000 mL/kg/h | INTRAVENOUS | Status: DC

## 2018-09-11 MED ORDER — SODIUM CHLORIDE 0.9 % WEIGHT BASED INFUSION
3.0000 mL/kg/h | INTRAVENOUS | Status: DC
  Administered 2018-09-11: 3 mL/kg/h via INTRAVENOUS

## 2018-09-11 MED ORDER — HEPARIN (PORCINE) IN NACL 1000-0.9 UT/500ML-% IV SOLN
INTRAVENOUS | Status: AC
  Filled 2018-09-11: qty 1000

## 2018-09-11 MED ORDER — SODIUM CHLORIDE 0.9 % IV SOLN: INTRAVENOUS | Status: AC

## 2018-09-11 MED ORDER — MIDAZOLAM HCL 2 MG/2ML IJ SOLN
INTRAMUSCULAR | Status: DC | PRN
  Administered 2018-09-11: 0.5 mg via INTRAVENOUS
  Administered 2018-09-11: 1 mg via INTRAVENOUS

## 2018-09-11 MED ORDER — FENTANYL CITRATE (PF) 100 MCG/2ML IJ SOLN
INTRAMUSCULAR | Status: AC
  Filled 2018-09-11: qty 2

## 2018-09-11 MED ORDER — ONDANSETRON HCL 4 MG/2ML IJ SOLN: 4.0000 mg | Freq: Four times a day (QID) | INTRAMUSCULAR | Status: DC | PRN

## 2018-09-11 SURGICAL SUPPLY — 10 items
CATH INFINITI JR4 5F (CATHETERS) ×2 IMPLANT
DEVICE RAD TR BAND REGULAR (VASCULAR PRODUCTS) ×2 IMPLANT
GLIDESHEATH SLEND A-KIT 6F 22G (SHEATH) ×2 IMPLANT
GUIDEWIRE INQWIRE 1.5J.035X260 (WIRE) ×1 IMPLANT
INQWIRE 1.5J .035X260CM (WIRE) ×2
KIT HEART LEFT (KITS) ×2 IMPLANT
PACK CARDIAC CATHETERIZATION (CUSTOM PROCEDURE TRAY) ×2 IMPLANT
SHEATH PROBE COVER 6X72 (BAG) ×2 IMPLANT
TRANSDUCER W/STOPCOCK (MISCELLANEOUS) ×2 IMPLANT
TUBING CIL FLEX 10 FLL-RA (TUBING) ×2 IMPLANT

## 2018-09-11 NOTE — Interval H&P Note (Signed)
Cath Lab Visit (complete for each Cath Lab visit)  Clinical Evaluation Leading to the Procedure:   ACS: Yes.    Non-ACS:    Anginal Classification: CCS III  Anti-ischemic medical therapy: Minimal Therapy (1 class of medications)  Non-Invasive Test Results: No non-invasive testing performed  Prior CABG: No previous CABG      History and Physical Interval Note:  09/11/2018 2:16 PM  [name]  has presented today for surgery, with the diagnosis of chest pain  The various methods of treatment have been discussed with the patient and family. After consideration of risks, benefits and other options for treatment, the patient has consented to  Procedure(s): LEFT HEART CATH AND CORONARY ANGIOGRAPHY (N/A) as a surgical intervention .  The patient's history has been reviewed, patient examined, no change in status, stable for surgery.  I have reviewed the patient's chart and labs.  Questions were answered to the patient's satisfaction.     Lyn Records III

## 2018-09-11 NOTE — Progress Notes (Signed)
Cath lab notified K is 3.4 -> will replete x 1. May need standing K if HCTZ is to be continued. Dayna Dunn PA-C

## 2018-09-11 NOTE — H&P (View-Only) (Signed)
Progress Note  Patient Name: [name] Date of Encounter: 09/11/2018  Primary Cardiologist: No primary care provider on file. Hochrein  Subjective   Mild chest pressure this am.   Inpatient Medications    Scheduled Meds: . amLODipine  10 mg Oral Daily  . aspirin  81 mg Oral Pre-Cath  . aspirin  325 mg Oral Daily  . atorvastatin  20 mg Oral Daily  . enoxaparin (LOVENOX) injection  40 mg Subcutaneous Q24H  . FLUoxetine  20 mg Oral Daily  . lisinopril  5 mg Oral Daily  . nicotine  21 mg Transdermal Daily  . nitroGLYCERIN  0.5 inch Topical Q6H  . sodium chloride flush  3 mL Intravenous Once  . sodium chloride flush  3 mL Intravenous Q12H   Continuous Infusions: . sodium chloride 100 mL/hr at 09/10/18 0216  . sodium chloride    . sodium chloride 1 mL/kg/hr (09/11/18 0958)   PRN Meds: sodium chloride, acetaminophen, alum & mag hydroxide-simeth, clonazePAM, hydrALAZINE, iohexol, morphine injection, nitroGLYCERIN, ondansetron (ZOFRAN) IV, sodium chloride flush, zolpidem   Vital Signs    Vitals:   09/10/18 1215 09/10/18 1800 09/10/18 2344 09/11/18 0534  BP: (!) 152/61 (!) 142/62 (!) 120/54 (!) 149/65  Pulse: 64 78 (!) 56 63  Resp: 18 18 18 17   Temp: 98.2 F (36.8 C) 98 F (36.7 C) (!) 97.5 F (36.4 C) (!) 97.5 F (36.4 C)  TempSrc: Oral Oral Oral Oral  SpO2: 96% 95% 94% 97%  Weight:      Height:        Intake/Output Summary (Last 24 hours) at 09/11/2018 1046 Last data filed at 09/10/2018 1800 Gross per 24 hour  Intake 1449.36 ml  Output -  Net 1449.36 ml   Last 3 Weights 09/10/2018 09/09/2018 05/18/2013  Weight (lbs) 116 lb 10 oz 120 lb 132 lb  Weight (kg) 52.9 kg 54.432 kg 59.875 kg      Telemetry    Sinus - Personally Reviewed  ECG     No AM EKG- Personally Reviewed  Physical Exam   GEN: No acute distress.   Neck: No JVD Cardiac: RRR, no murmurs, rubs, or gallops.  Respiratory: Clear to auscultation bilaterally. GI: Soft, nontender,  non-distended  MS: No edema; No deformity. Neuro:  Nonfocal  Psych: Normal affect   Labs    Chemistry Recent Labs  Lab 09/09/18 1745 09/11/18 0549  NA 140 140  K 3.7 3.4*  CL 100 105  CO2 27 27  GLUCOSE 120* 114*  BUN 16 19  CREATININE 0.66 0.61  CALCIUM 10.5* 9.4  GFRNONAA >60 >60  GFRAA >60 >60  ANIONGAP 13 8     Hematology Recent Labs  Lab 09/09/18 1745 09/11/18 0549  WBC 8.4 5.6  RBC 5.10 4.76  HGB 15.1* 14.2  HCT 46.5* 43.5  MCV 91.2 91.4  MCH 29.6 29.8  MCHC 32.5 32.6  RDW 13.7 13.4  PLT 208 168    Cardiac Enzymes Recent Labs  Lab 09/09/18 2219 09/10/18 0336 09/10/18 0759  TROPONINI <0.03 <0.03 <0.03    Recent Labs  Lab 09/09/18 1803  TROPIPOC 0.00     BNPNo results for input(s): BNP, PROBNP in the last 168 hours.   DDimer  Recent Labs  Lab 09/09/18 2017  DDIMER 0.59*     Radiology    Dg Chest 2 View  Result Date: 09/09/2018 CLINICAL DATA:  Chest pain beginning 3 days ago. EXAM: CHEST - 2 VIEW COMPARISON:  None. FINDINGS: The  heart size and mediastinal contours are within normal limits. Both lungs are clear. The visualized skeletal structures are unremarkable. IMPRESSION: Negative two view chest x-ray Electronically Signed   By: Dalessandro Baldyga  Mattern M.D.   On: 09/09/2018 18:46   Ct Angio Chest Pe W Or Wo Contrast  Result Date: 09/09/2018 CLINICAL DATA:  58 y/o F; chest pain radiating into the left arm and back for 3 days. Positive D-dimer. EXAM: CT ANGIOGRAPHY CHEST WITH CONTRAST TECHNIQUE: Multidetector CT imaging of the chest was performed using the standard protocol during bolus administration of intravenous contrast. Multiplanar CT image reconstructions and MIPs were obtained to evaluate the vascular anatomy. CONTRAST:  68 cc Isovue 370 COMPARISON:  09/09/2018 chest radiograph FINDINGS: Cardiovascular: Satisfactory opacification of the pulmonary arteries to the segmental level. No evidence of pulmonary embolism. Normal heart size. No  pericardial effusion. Aortic and coronary artery calcific atherosclerosis. Mediastinum/Nodes: No enlarged mediastinal, hilar, or axillary lymph nodes. Thyroid gland, trachea, and esophagus demonstrate no significant findings. Lungs/Pleura: Paraseptal emphysema with upper lobe predominance. No consolidation, effusion, or pneumothorax. Upper Abdomen: No acute abnormality. Musculoskeletal: No chest wall abnormality. No acute or significant osseous findings. Review of the MIP images confirms the above findings. IMPRESSION: 1. No pulmonary embolus identified. 2. Paraseptal emphysema with upper lobe predominance. 3. Aortic and coronary artery calcific atherosclerosis. Emphysema (ICD10-J43.9). Electronically Signed   By: Lance  Furusawa-Stratton M.D.   On: 09/09/2018 22:45    Cardiac Studies   Echo 09/10/18:   1. The left ventricle has normal systolic function with an ejection fraction of 60-65%. The cavity size was normal. Left ventricular diastolic Doppler parameters are consistent with impaired relaxation.  2. The right ventricle has normal systolic function. The cavity was normal. There is no increase in right ventricular wall thickness.  3. The mitral valve is normal in structure.  4. The tricuspid valve is normal in structure.  5. The aortic valve is normal in structure.  6. The pulmonic valve was normal in structure.  Patient Profile     58 y.o. female with history of HTN, DM, CVA and ongoing tobacco abuse admitted with chest pain concerning for unstable angina. Echo 09/10/18 with normal LV systolic function with LVEF=60-65%. No valve disease.   Assessment & Plan    1. Unstable angina: Plans for cardiac cath later today with possible PCI. Continue ASA and statin  2. Tobacco abuse: Smoking cessation is encouraged.   For questions or updates, please contact [facility] Please consult [email or web address] for contact info under        Signed, Riordan Walle, MD  09/11/2018, 10:46 AM    

## 2018-09-11 NOTE — Care Management Obs Status (Signed)
MEDICARE OBSERVATION STATUS NOTIFICATION   Patient Details  Name: [name] MRN: [id number] Date of Birth: 1961/06/21   Medicare Observation Status Notification Given:  Yes    Colleen Can RN, BSN, NCM-BC, ACM-RN [phone] 09/11/2018, 9:50 AM

## 2018-09-11 NOTE — Progress Notes (Signed)
Progress Note  Patient Name: [name] Date of Encounter: 09/11/2018  Primary Cardiologist: No primary care provider on file. [place]  Subjective   Mild chest pressure this am.   Inpatient Medications    Scheduled Meds: . amLODipine  10 mg Oral Daily  . aspirin  81 mg Oral Pre-Cath  . aspirin  325 mg Oral Daily  . atorvastatin  20 mg Oral Daily  . enoxaparin (LOVENOX) injection  40 mg Subcutaneous Q24H  . FLUoxetine  20 mg Oral Daily  . lisinopril  5 mg Oral Daily  . nicotine  21 mg Transdermal Daily  . nitroGLYCERIN  0.5 inch Topical Q6H  . sodium chloride flush  3 mL Intravenous Once  . sodium chloride flush  3 mL Intravenous Q12H   Continuous Infusions: . sodium chloride 100 mL/hr at 09/10/18 0216  . sodium chloride    . sodium chloride 1 mL/kg/hr (09/11/18 0958)   PRN Meds: sodium chloride, acetaminophen, alum & mag hydroxide-simeth, clonazePAM, hydrALAZINE, iohexol, morphine injection, nitroGLYCERIN, ondansetron (ZOFRAN) IV, sodium chloride flush, zolpidem   Vital Signs    Vitals:   09/10/18 1215 09/10/18 1800 09/10/18 2344 09/11/18 0534  BP: (!) 152/61 (!) 142/62 (!) 120/54 (!) 149/65  Pulse: 64 78 (!) 56 63  Resp: 18 18 18 17   Temp: 98.2 F (36.8 C) 98 F (36.7 C) (!) 97.5 F (36.4 C) (!) 97.5 F (36.4 C)  TempSrc: Oral Oral Oral Oral  SpO2: 96% 95% 94% 97%  Weight:      Height:        Intake/Output Summary (Last 24 hours) at 09/11/2018 1046 Last data filed at 09/10/2018 1800 Gross per 24 hour  Intake 1449.36 ml  Output -  Net 1449.36 ml   Last 3 Weights 09/10/2018 09/09/2018 05/18/2013  Weight (lbs) 116 lb 10 oz 120 lb 132 lb  Weight (kg) 52.9 kg 54.432 kg 59.875 kg      Telemetry    Sinus - Personally Reviewed  ECG     No AM EKG- Personally Reviewed  Physical Exam   GEN: No acute distress.   Neck: No JVD Cardiac: RRR, no murmurs, rubs, or gallops.  Respiratory: Clear to auscultation bilaterally. GI: Soft, nontender,  non-distended  MS: No edema; No deformity. Neuro:  Nonfocal  Psych: Normal affect   Labs    Chemistry Recent Labs  Lab 09/09/18 1745 09/11/18 0549  NA 140 140  K 3.7 3.4*  CL 100 105  CO2 27 27  GLUCOSE 120* 114*  BUN 16 19  CREATININE 0.66 0.61  CALCIUM 10.5* 9.4  GFRNONAA >60 >60  GFRAA >60 >60  ANIONGAP 13 8     Hematology Recent Labs  Lab 09/09/18 1745 09/11/18 0549  WBC 8.4 5.6  RBC 5.10 4.76  HGB 15.1* 14.2  HCT 46.5* 43.5  MCV 91.2 91.4  MCH 29.6 29.8  MCHC 32.5 32.6  RDW 13.7 13.4  PLT 208 168    Cardiac Enzymes Recent Labs  Lab 09/09/18 2219 09/10/18 0336 09/10/18 0759  TROPONINI <0.03 <0.03 <0.03    Recent Labs  Lab 09/09/18 1803  TROPIPOC 0.00     BNPNo results for input(s): BNP, PROBNP in the last 168 hours.   DDimer  Recent Labs  Lab 09/09/18 2017  DDIMER 0.59*     Radiology    Dg Chest 2 View  Result Date: 09/09/2018 CLINICAL DATA:  Chest pain beginning 3 days ago. EXAM: CHEST - 2 VIEW COMPARISON:  None. FINDINGS: The  heart size and mediastinal contours are within normal limits. Both lungs are clear. The visualized skeletal structures are unremarkable. IMPRESSION: Negative two view chest x-ray Electronically Signed   By: Marin Roberts M.D.   On: 09/09/2018 18:46   Ct Angio Chest Pe W Or Wo Contrast  Result Date: 09/09/2018 CLINICAL DATA:  58 y/o F; chest pain radiating into the left arm and back for 3 days. Positive D-dimer. EXAM: CT ANGIOGRAPHY CHEST WITH CONTRAST TECHNIQUE: Multidetector CT imaging of the chest was performed using the standard protocol during bolus administration of intravenous contrast. Multiplanar CT image reconstructions and MIPs were obtained to evaluate the vascular anatomy. CONTRAST:  68 cc Isovue 370 COMPARISON:  09/09/2018 chest radiograph FINDINGS: Cardiovascular: Satisfactory opacification of the pulmonary arteries to the segmental level. No evidence of pulmonary embolism. Normal heart size. No  pericardial effusion. Aortic and coronary artery calcific atherosclerosis. Mediastinum/Nodes: No enlarged mediastinal, hilar, or axillary lymph nodes. Thyroid gland, trachea, and esophagus demonstrate no significant findings. Lungs/Pleura: Paraseptal emphysema with upper lobe predominance. No consolidation, effusion, or pneumothorax. Upper Abdomen: No acute abnormality. Musculoskeletal: No chest wall abnormality. No acute or significant osseous findings. Review of the MIP images confirms the above findings. IMPRESSION: 1. No pulmonary embolus identified. 2. Paraseptal emphysema with upper lobe predominance. 3. Aortic and coronary artery calcific atherosclerosis. Emphysema (ICD10-J43.9). Electronically Signed   By: Mitzi Hansen M.D.   On: 09/09/2018 22:45    Cardiac Studies   Echo 09/10/18:   1. The left ventricle has normal systolic function with an ejection fraction of 60-65%. The cavity size was normal. Left ventricular diastolic Doppler parameters are consistent with impaired relaxation.  2. The right ventricle has normal systolic function. The cavity was normal. There is no increase in right ventricular wall thickness.  3. The mitral valve is normal in structure.  4. The tricuspid valve is normal in structure.  5. The aortic valve is normal in structure.  6. The pulmonic valve was normal in structure.  Patient Profile     58 y.o. female with history of HTN, DM, CVA and ongoing tobacco abuse admitted with chest pain concerning for unstable angina. Echo 09/10/18 with normal LV systolic function with LVEF=60-65%. No valve disease.   Assessment & Plan    1. Unstable angina: Plans for cardiac cath later today with possible PCI. Continue ASA and statin  2. Tobacco abuse: Smoking cessation is encouraged.   For questions or updates, please contact [facility] Please consult [email or web address] for contact info under        Signed, Verne Carrow, MD  09/11/2018, 10:46 AM

## 2018-09-11 NOTE — CV Procedure (Signed)
   Coronary angiography performed via right radial approach using real-time vascular ultrasound guidance for access.  Widely patent coronary arteries without obstructive disease.  The circumflex is small and contains 30% mid vessel eccentric narrowing.  Mild left coronary calcification noted on cine fluoroscopy.

## 2018-09-11 NOTE — Progress Notes (Signed)
Progress Note    [name]  ONG:[id number] DOB: 10/03/1960  DOA: 2/1[name]9/2020 PCP: [name], Ameah P, FNP    Brief Narrative:   Chief complaint: Chest pain  Medical records reviewed and are as summarized below:  Toni HarrisonCheryl Pola is an 58 y.o. female with pmh HTN, HLD, DM type II, CVA, depression, anxiety, and tobacco abuse; who presented with complaints of chest pain.  Assessment/Plan:   Principal Problem:   Chest pain Active Problems:   Depression   Tobacco abuse   Stroke (HCC)   HLD (hyperlipidemia)   HTN (hypertension)   Unstable angina (HCC)  Suspected unstable angina: Patient presents with complaints of intermittent chest pain.  Troponins have been negative.  EKG was found to have no significant ischemic changes.  Elevated d-dimer led to patient having CT angiogram of the chest, but it was negative for signs of a PE.  Echocardiogram revealed EF of 60 to 65% with impaired relaxation -Continue aspirin and statin -Continue Nitropaste -Appreciate cardiology consultative services, planning for cardiac cath in a.m.  Essential hypertension: Stable. -Start lisinopril per cardiology -Discuss medications of hydrochlorothiazide and amlodipine  Hyperlipidemia: LDL 112.  Not at goal less than 70 -Continue Lipitor  Prediabetes/diabetes mellitus type 2: Patient not currently on any diabetic medications.  Hemoglobin A1c 6.4. -Continue to monitor  Depression/anxiety: Stable. -Continue Prozac and clonidine  Prior history of CVA -Continue aspirin and Lipitor  Tobacco abuse: Patient reports that she continues to smoke. -Continue nicotine patch  Body mass index is 22.78 kg/m.   Family Communication/Anticipated D/C date and plan/Code Status   DVT prophylaxis: Heparin. Code Status: Full Code.  Family Communication: None present Disposition Plan: Possible discharge home depending on catheterization results   Medical Consultants:   -Cardiology  Dr.  Clifton JamesMcalhany  Anti-Infectives:    None  Subjective:   Patient reports that she still is intermittently having chest pain with complaints of a feeling like her neck is on fire. She recently had Nitropaste placed.  Objective:    Vitals:   09/10/18 0620 09/10/18 1215 09/10/18 1800 09/10/18 2344  BP: (!) 122/53 (!) 152/61 (!) 142/62 (!) 120/54  Pulse: (!) 50 64 78 (!) 56  Resp: 16 18 18 18   Temp: 97.9 F (36.6 C) 98.2 F (36.8 C) 98 F (36.7 C) (!) 97.5 F (36.4 C)  TempSrc: Oral Oral Oral Oral  SpO2: 97% 96% 95% 94%  Weight:      Height:        Intake/Output Summary (Last 24 hours) at 09/11/2018 0515 Last data filed at 09/10/2018 1800 Gross per 24 hour  Intake 1449.36 ml  Output -  Net 1449.36 ml   Filed Weights   09/09/18 1741 09/10/18 0135  Weight: 54.4 kg 52.9 kg    Exam: Constitutional: NAD, calm, comfortable Eyes: PERRL, lids and conjunctivae normal ENMT: Mucous membranes are moist. Posterior pharynx clear of any exudate or lesions.Neck: normal, supple, no masses, no thyromegaly Respiratory: clear to auscultation bilaterally, no wheezing, no crackles. Normal respiratory effort. No accessory muscle use.  Cardiovascular: Bradycardic, no murmurs / rubs / gallops. No extremity edema. 2+ pedal pulses. No carotid bruits.  Abdomen: no tenderness, no masses palpated. No hepatosplenomegaly. Bowel sounds positive.  Musculoskeletal: no clubbing / cyanosis. No joint deformity upper and lower extremities. Good ROM, no contractures. Normal muscle tone.  Skin: no rashes, lesions, ulcers. No induration Neurologic: CN 2-12 grossly intact. Sensation intact, DTR normal. Strength 5/5 in all 4.  Psychiatric: Normal judgment and insight. Alert  and oriented x 3.  Anxious mood.    Data Reviewed:   I have personally reviewed following labs and imaging studies:  Labs: Labs show the following:   Basic Metabolic Panel: Recent Labs  Lab 09/09/18 1745  NA 140  K 3.7  CL 100  CO2 27   GLUCOSE 120*  BUN 16  CREATININE 0.66  CALCIUM 10.5*   GFR Estimated Creatinine Clearance: 54.4 mL/min (by C-G formula based on SCr of 0.66 mg/dL). Liver Function Tests: No results for input(s): AST, ALT, ALKPHOS, BILITOT, PROT, ALBUMIN in the last 168 hours. No results for input(s): LIPASE, AMYLASE in the last 168 hours. No results for input(s): AMMONIA in the last 168 hours. Coagulation profile No results for input(s): INR, PROTIME in the last 168 hours.  CBC: Recent Labs  Lab 09/09/18 1745  WBC 8.4  HGB 15.1*  HCT 46.5*  MCV 91.2  PLT 208   Cardiac Enzymes: Recent Labs  Lab 09/09/18 2219 09/10/18 0336 09/10/18 0759  TROPONINI <0.03 <0.03 <0.03   BNP (last 3 results) No results for input(s): PROBNP in the last 8760 hours. CBG: Recent Labs  Lab 09/10/18 0621 09/10/18 0748 09/10/18 1146 09/10/18 1654 09/10/18 2110  GLUCAP 102* 97 94 85 146*   D-Dimer: Recent Labs    09/09/18 2017  DDIMER 0.59*   Hgb A1c: Recent Labs    09/10/18 0336  HGBA1C 6.4*   Lipid Profile: Recent Labs    09/10/18 0336  CHOL 190  HDL 59  LDLCALC 112*  TRIG 93  CHOLHDL 3.2   Thyroid function studies: No results for input(s): TSH, T4TOTAL, T3FREE, THYROIDAB in the last 72 hours.  Invalid input(s): FREET3 Anemia work up: No results for input(s): VITAMINB12, FOLATE, FERRITIN, TIBC, IRON, RETICCTPCT in the last 72 hours. Sepsis Labs: Recent Labs  Lab 09/09/18 1745  WBC 8.4    Microbiology No results found for this or any previous visit (from the past 240 hour(s)).  Procedures and diagnostic studies:  Dg Chest 2 View  Result Date: 09/09/2018 CLINICAL DATA:  Chest pain beginning 3 days ago. EXAM: CHEST - 2 VIEW COMPARISON:  None. FINDINGS: The heart size and mediastinal contours are within normal limits. Both lungs are clear. The visualized skeletal structures are unremarkable. IMPRESSION: Negative two view chest x-ray Electronically Signed   By: Marin Roberts M.D.   On: 09/09/2018 18:46   Ct Angio Chest Pe W Or Wo Contrast  Result Date: 09/09/2018 CLINICAL DATA:  58 y/o F; chest pain radiating into the left arm and back for 3 days. Positive D-dimer. EXAM: CT ANGIOGRAPHY CHEST WITH CONTRAST TECHNIQUE: Multidetector CT imaging of the chest was performed using the standard protocol during bolus administration of intravenous contrast. Multiplanar CT image reconstructions and MIPs were obtained to evaluate the vascular anatomy. CONTRAST:  68 cc Isovue 370 COMPARISON:  09/09/2018 chest radiograph FINDINGS: Cardiovascular: Satisfactory opacification of the pulmonary arteries to the segmental level. No evidence of pulmonary embolism. Normal heart size. No pericardial effusion. Aortic and coronary artery calcific atherosclerosis. Mediastinum/Nodes: No enlarged mediastinal, hilar, or axillary lymph nodes. Thyroid gland, trachea, and esophagus demonstrate no significant findings. Lungs/Pleura: Paraseptal emphysema with upper lobe predominance. No consolidation, effusion, or pneumothorax. Upper Abdomen: No acute abnormality. Musculoskeletal: No chest wall abnormality. No acute or significant osseous findings. Review of the MIP images confirms the above findings. IMPRESSION: 1. No pulmonary embolus identified. 2. Paraseptal emphysema with upper lobe predominance. 3. Aortic and coronary artery calcific atherosclerosis. Emphysema (ICD10-J43.9). Electronically Signed  By: Mitzi Hansen M.D.   On: 09/09/2018 22:45    Medications:   . amLODipine  10 mg Oral Daily  . aspirin  81 mg Oral Pre-Cath  . aspirin  325 mg Oral Daily  . atorvastatin  20 mg Oral Daily  . enoxaparin (LOVENOX) injection  40 mg Subcutaneous Q24H  . FLUoxetine  20 mg Oral Daily  . hydrochlorothiazide  25 mg Oral Daily  . nicotine  21 mg Transdermal Daily  . nitroGLYCERIN  0.5 inch Topical Q6H  . sodium chloride flush  3 mL Intravenous Once  . sodium chloride flush  3 mL Intravenous  Q12H   Continuous Infusions: . sodium chloride 100 mL/hr at 09/10/18 0216  . sodium chloride    . sodium chloride     Followed by  . sodium chloride       LOS: 0 days   Elika Godar A Townes Fuhs  [facility]   *Please refer to [email or web address], password [email or web address] to get updated schedule on who will round on this patient, as hospitalists switch teams weekly. If 7PM-7AM, please contact night-coverage at [email or web address], password [email or web address] for any overnight needs.

## 2018-09-14 ENCOUNTER — Encounter (HOSPITAL_COMMUNITY): Payer: Self-pay | Admitting: Interventional Cardiology

## 2018-09-14 MED FILL — Lidocaine HCl Local Preservative Free (PF) Inj 1%: INTRAMUSCULAR | Qty: 30 | Status: AC

## 2018-09-14 NOTE — Discharge Summary (Signed)
Lajoy Vanamburg, is a 58 y.o. female  DOB 1961/06/25  MRN [id number].  Admission date:  09/09/2018  Admitting Physician  Lorretta Harp, MD  Discharge Date:  09/14/2018   Primary MD  Armando Gang, FNP  Recommendations for primary care physician for things to follow:      Discharge Diagnosis   Principal Problem:   Chest pain Active Problems:   Depression   Tobacco abuse   Stroke (HCC)   HLD (hyperlipidemia)   HTN (hypertension)   Unstable angina [facility])      Past Medical History:  Diagnosis Date  . Anxiety   . Chest pain   . Depression   . Diabetes mellitus without complication (HCC)   . Heart murmur    Normal echo 2007  . History of kidney stones   . Hypertension   . MI, old    Negative nuclear stress test 2007  . Neck pain   . Stroke [facility])    mini stroke    Past Surgical History:  Procedure Laterality Date  . BACK SURGERY     x4, L4-5  . LEFT HEART CATH AND CORONARY ANGIOGRAPHY N/A 09/11/2018   Procedure: LEFT HEART CATH AND CORONARY ANGIOGRAPHY;  Surgeon: Lyn Records, MD;  Location: [facility] INVASIVE CV LAB;  Service: Cardiovascular;  Laterality: N/A;  . TUBAL LIGATION         HPI  from the history and physical done on the day of admission:  Dawnna Gritz is a 58 y.o. female with medical history significant of hypertension, hyperlipidemia, diet controled diabetes mellitus, stroke, depression, anxiety, tobacco abuse, who presents with chest pain.  Patient states that she has been having intermittent chest pain in the past several days.  The chest pain is located in the left side of the chest, 8 out of 10 in severity when it is on, currently chest pain-free, dull, radiating to left shoulder and left neck areas.  It is not pleuritic.  No recent long-distance traveling.  No tenderness in the calf areas.   Patient has mild cough and shortness of breath.  Patient has nausea, but no vomiting, diarrhea or abdominal pain.  No symptoms for UTI.  Denies fever or chills.  This patient used to be seen by family medicine teaching service, but was not seen since year 2014, but reports that they are no longer her primary care physician.  ED Course: pt was found to have negative troponin, WBC 8.4, d-dimer +0.59, electrolytes renal function okay, temperature normal, bradycardia, oxygen saturation 95% on room air, negative chest x-ray.  Patient is placed on telemetry bed for observation.      Hospital Course:   1.  Chest pain, unspecified/Suspected unstable angina: Patient presents with complaints of intermittent chest pain.  Troponins have been negative.  EKG was found to have no significant ischemic changes.  Elevated d-dimer led to patient having CT angiogram of the chest, but it was negative for signs of a PE.  Echocardiogram revealed EF of 60 to 65% with impaired  relaxation.  Patient was continued on aspirin and statin.  Patient underwent cardiac catheterization on 2/21, but no obstructive coronary disease was identified.  It was recommended that patient have aggressive secondary risk modification including smoking cessation.  Question the possibility of symptoms being related to neuropathy.  Discussed may trial of gabapentin for possible symptom relief to follow-up with primary care provider in outpatient setting.  Patient was agreeable and prescription sent.  2.  Essential hypertension: Blood pressures range from 125/58-159/73.  Cardiology recommended starting lisinopril. Patient was continued however on hydrochlorothiazide and amlodipine at discharge to follow-up with primary care provider for further changes.  3.  Hyperlipidemia: LDL 112.  Not at goal less than 70. Continued current dose of Lipitor.  4.  Prediabetes/diabetes mellitus type 2: Patient not currently on any diabetic medications.   Hemoglobin A1c 6.4.  Patient will need continued outpatient monitoring  5.  Depression/anxiety: Stable. Continued Prozac and Klonopin  6.  Prior history of CV, other nonhemorrhagic: Continued aspirin and Lipitor  7.  Tobacco abuse: Patient reports that she continues to smoke.  Counseled on the need of cessation of tobacco use and patient was provided with nicotine patch while in hospital.   Follow UP  Follow-up Information    Armando Gang, FNP Follow up.   Specialty:  Family Medicine Why:  Please call to make an appointment for 7 to 10 days from discharge from the hospital Contact information: [address] [place] [address] [phone]            Consults obtained: Cardiology Dr. Verdis Prime    Discharge Condition: Stable  Diet and Activity recommendation: See Discharge Instructions below  Discharge Instructions    Discharge Instructions    Diet - low sodium heart healthy   Complete by:  As directed    Discharge instructions   Complete by:  As directed    Follow with Primary MD Armando Gang, FNP in 7-10 days from your hospital discharge.  The cardiac catheter revealed no clear signs of a blockage to your heart.  Suspect symptoms could be related to bones compressing on nerves.  A prescription for gabapentin has been sent to your pharmacy.  These follow-up with your primary care provider for further evaluation of this as a possibility the cause of your symptoms.   -  checked  by Primary MD or SNF MD in 5-7 days ( we routinely change or add medications that can affect your baseline labs and fluid status, therefore we recommend that you get the mentioned basic workup next visit with your PCP, your PCP may decide not to get them or add new tests based on their clinical decision)  Activity: Increase activity slowly as tolerated  Disposition: Home    Diet: Heart Healthy        Special Instructions: If you have smoked or chewed Tobacco  in the last  2 yrs please stop smoking, stop any regular Alcohol  and or any Recreational drug use.  On your next visit with your primary care physician please Get Medicines reviewed and adjusted.  Please request your Candase, Frum, FNP to go over all Hospital Tests and Procedure/Radiological results at the follow up, please get all Hospital records sent to your Prim MD by signing hospital release before you go home.  If you experience worsening of your admission symptoms, develop shortness of breath, life threatening emergency, suicidal or homicidal thoughts you must seek medical attention immediately by calling 911 or calling your  MD immediately  if symptoms less severe.  You Must read complete instructions/literature along with all the possible adverse reactions/side effects for all the Medicines you take and that have been prescribed to you. Take any new Medicines after you have completely understood and accpet all the possible adverse reactions/side effects.   Do not drive, operate heavy machinery, perform activities at heights, swimming or participation in water activities or provide baby sitting services if your were admitted for syncope or siezures until you have seen by Primary MD or a Neurologist and advised to do so again.  Do not drive when taking Pain medications.  Do not take more than prescribed Pain, Sleep and Anxiety Medications  Wear Seat belts while driving.   Please note  You were cared for by a hospitalist during your hospital stay. If you have any questions about your discharge medications or the care you received while you were in the hospital after you are discharged, you can call the unit and asked to speak with the hospitalist on call if the hospitalist that took care of you is not available. Once you are discharged, your primary care physician will handle any further medical issues. Please note that NO REFILLS for any discharge medications will be authorized once you are  discharged, as it is imperative that you return to your primary care physician (or establish a relationship with a primary care physician if you do not have one) for your aftercare needs so that they can reassess your need for medications and monitor your lab values.   Increase activity slowly   Complete by:  As directed         Discharge Medications     Allergies as of 09/11/2018   No Known Allergies     Medication List    TAKE these medications   amLODipine 10 MG tablet Commonly known as:  NORVASC Take 1 tablet (10 mg total) by mouth daily.   aspirin 325 MG tablet Take 1 tablet (325 mg total) by mouth daily.   atorvastatin 20 MG tablet Commonly known as:  LIPITOR Take 1 tablet (20 mg total) by mouth daily.   clonazePAM 0.5 MG tablet Commonly known as:  KLONOPIN Take 0.5 mg by mouth daily as needed.   FLUoxetine 20 MG capsule Commonly known as:  PROZAC Take 1 capsule (20 mg total) by mouth daily.   gabapentin 300 MG capsule Commonly known as:  NEURONTIN Take 1 capsule (300 mg total) by mouth 3 (three) times daily as needed (nerve pain).   hydrochlorothiazide 25 MG tablet Commonly known as:  HYDRODIURIL Take 25 mg by mouth daily.   nicotine 21 mg/24hr patch Commonly known as:  NICODERM CQ - dosed in mg/24 hours Place 1 patch (21 mg total) onto the skin daily.       Major procedures and Radiology Reports - PLEASE review detailed and final reports for all details, in brief -   Left heart catheterization: 09/11/2018  Nonobstructive coronary artery disease involving the mid circumflex and mid to distal RCA in a very mild manner with obstruction less than 30%.  Normal left main  Normal LAD and diagonals  Hyperdynamic left ventricle with EF felt to be greater than 60%.  Quality of the study is degraded due to ventricular ectopy induced by IV catheter.  Mild left coronary calcification noted on cine fluoroscopy.  RECOMMENDATIONS:   No obstructive coronary  disease is identified.  History is not compatible with INOCA.   Aggressive secondary risk modification  including smoking cessation.   Echocardiogram 09/10/2018: Impression  1. The left ventricle has normal systolic function with an ejection fraction of 60-65%. The cavity size was normal. Left ventricular diastolic Doppler parameters are consistent with impaired relaxation.  2. The right ventricle has normal systolic function. The cavity was normal. There is no increase in right ventricular wall thickness.  3. The mitral valve is normal in structure.  4. The tricuspid valve is normal in structure.  5. The aortic valve is normal in structure.  6. The pulmonic valve was normal in structure.   Dg Chest 2 View  Result Date: 09/09/2018 CLINICAL DATA:  Chest pain beginning 3 days ago. EXAM: CHEST - 2 VIEW COMPARISON:  None. FINDINGS: The heart size and mediastinal contours are within normal limits. Both lungs are clear. The visualized skeletal structures are unremarkable. IMPRESSION: Negative two view chest x-ray Electronically Signed   By: Marin Roberts M.D.   On: 09/09/2018 18:46   Ct Angio Chest Pe W Or Wo Contrast  Result Date: 09/09/2018 CLINICAL DATA:  58 y/o F; chest pain radiating into the left arm and back for 3 days. Positive D-dimer. EXAM: CT ANGIOGRAPHY CHEST WITH CONTRAST TECHNIQUE: Multidetector CT imaging of the chest was performed using the standard protocol during bolus administration of intravenous contrast. Multiplanar CT image reconstructions and MIPs were obtained to evaluate the vascular anatomy. CONTRAST:  68 cc Isovue 370 COMPARISON:  09/09/2018 chest radiograph FINDINGS: Cardiovascular: Satisfactory opacification of the pulmonary arteries to the segmental level. No evidence of pulmonary embolism. Normal heart size. No pericardial effusion. Aortic and coronary artery calcific atherosclerosis. Mediastinum/Nodes: No enlarged mediastinal, hilar, or axillary lymph nodes.  Thyroid gland, trachea, and esophagus demonstrate no significant findings. Lungs/Pleura: Paraseptal emphysema with upper lobe predominance. No consolidation, effusion, or pneumothorax. Upper Abdomen: No acute abnormality. Musculoskeletal: No chest wall abnormality. No acute or significant osseous findings. Review of the MIP images confirms the above findings. IMPRESSION: 1. No pulmonary embolus identified. 2. Paraseptal emphysema with upper lobe predominance. 3. Aortic and coronary artery calcific atherosclerosis. Emphysema (ICD10-J43.9). Electronically Signed   By: Mitzi Hansen M.D.   On: 09/09/2018 22:45    Micro Results    No results found for this or any previous visit (from the past 240 hour(s)).     Today   Subjective    Jaskirat Zertuche today states that she still continues to have chest pains that has not completely resolved.   Objective   Blood pressure (!) 135/52, pulse 66, temperature 98.3 F (36.8 C), temperature source Oral, resp. rate 18, height 5' (1.524 m), weight 52.9 kg, SpO2 98 %.  No intake or output data in the 24 hours ending 09/14/18 0904  Exam  Constitutional: NAD, calm, comfortable Eyes: PERRL, lids and conjunctivae normal ENMT: Mucous membranes are moist. Posterior pharynx clear of any exudate or lesions. Fair dentition.  Neck: normal, supple, no masses, no thyromegaly Respiratory: clear to auscultation bilaterally, no wheezing, no crackles. Normal respiratory effort. No accessory muscle use.  Cardiovascular: Regular rate and rhythm, no murmurs / rubs / gallops. No extremity edema. 2+ pedal pulses. No carotid bruits.  Abdomen: no tenderness, no masses palpated. No hepatosplenomegaly. Bowel sounds positive.  Musculoskeletal: no clubbing / cyanosis. No joint deformity upper and lower extremities. Good ROM, no contractures. Normal muscle tone.  Skin: no rashes, lesions, ulcers. No induration Neurologic: CN 2-12 grossly intact. Sensation intact,  DTR normal. Strength 5/5 in all 4.  Psychiatric: Normal judgment and insight. Alert and oriented  x 3. Normal mood.    Data Review   CBC w Diff:  Lab Results  Component Value Date   WBC 5.6 09/11/2018   HGB 14.2 09/11/2018   HGB 14.3 05/25/2013   HCT 43.5 09/11/2018   HCT 40.5 05/25/2013   PLT 168 09/11/2018   PLT 210 05/25/2013   LYMPHOPCT 29 05/18/2013   MONOPCT 8 05/18/2013   EOSPCT 2 05/18/2013   BASOPCT 1 05/18/2013    CMP:  Lab Results  Component Value Date   NA 140 09/11/2018   NA 138 05/25/2013   K 3.4 (L) 09/11/2018   K 3.4 (L) 05/25/2013   CL 105 09/11/2018   CL 105 05/25/2013   CO2 27 09/11/2018   CO2 30 05/25/2013   BUN 19 09/11/2018   BUN 13 05/25/2013   CREATININE 0.61 09/11/2018   CREATININE 0.63 05/25/2013   CREATININE 0.59 08/19/2011   PROT 8.0 05/25/2013   ALBUMIN 4.0 05/25/2013   BILITOT 0.3 05/25/2013   ALKPHOS 179 (H) 05/25/2013   AST 23 05/25/2013   ALT 39 05/25/2013  .   Total Time in preparing paper work, data evaluation and todays exam - 35 minutes  Clydie Braun M.D on 09/11/2018 at 6:04 PM  [facility]   Office  [phone]

## 2019-02-08 ENCOUNTER — Encounter (HOSPITAL_COMMUNITY): Payer: Self-pay | Admitting: Interventional Cardiology

## 2019-02-11 DIAGNOSIS — M542 Cervicalgia: Secondary | ICD-10-CM | POA: Insufficient documentation

## 2019-03-04 DIAGNOSIS — Z981 Arthrodesis status: Secondary | ICD-10-CM | POA: Insufficient documentation

## 2019-12-27 ENCOUNTER — Encounter (INDEPENDENT_AMBULATORY_CARE_PROVIDER_SITE_OTHER): Payer: Self-pay | Admitting: Vascular Surgery

## 2019-12-27 ENCOUNTER — Ambulatory Visit (INDEPENDENT_AMBULATORY_CARE_PROVIDER_SITE_OTHER): Payer: Medicare Other | Admitting: Vascular Surgery

## 2019-12-27 ENCOUNTER — Other Ambulatory Visit: Payer: Self-pay

## 2019-12-27 VITALS — BP 216/74 | HR 66 | Resp 16 | Ht 60.0 in | Wt 125.0 lb

## 2019-12-27 DIAGNOSIS — I10 Essential (primary) hypertension: Secondary | ICD-10-CM

## 2019-12-27 DIAGNOSIS — I6529 Occlusion and stenosis of unspecified carotid artery: Secondary | ICD-10-CM | POA: Insufficient documentation

## 2019-12-27 DIAGNOSIS — I6523 Occlusion and stenosis of bilateral carotid arteries: Secondary | ICD-10-CM | POA: Diagnosis not present

## 2019-12-27 DIAGNOSIS — I2510 Atherosclerotic heart disease of native coronary artery without angina pectoris: Secondary | ICD-10-CM | POA: Insufficient documentation

## 2019-12-27 DIAGNOSIS — I25118 Atherosclerotic heart disease of native coronary artery with other forms of angina pectoris: Secondary | ICD-10-CM | POA: Diagnosis not present

## 2019-12-27 DIAGNOSIS — E785 Hyperlipidemia, unspecified: Secondary | ICD-10-CM | POA: Diagnosis not present

## 2019-12-27 NOTE — Progress Notes (Signed)
MRN : [id number]  [name] is a 59 y.o. (1961/05/30) female who presents with chief complaint of  Chief Complaint  Patient presents with  . New Patient (Initial Visit)    ref Lavena Bullion carotid stenosis  .  History of Present Illness:  The patient is seen for evaluation of carotid stenosis. The carotid stenosis was identified with a carotid duplex.  Duplex was obtained after a carotid bruit was identified.  The patient denies amaurosis fugax. There is no recent history of TIA symptoms or focal motor deficits. There is no prior documented CVA.  There is no history of migraine headaches. There is no history of seizures.  The patient is taking enteric-coated aspirin 81 mg daily.  The patient has a history of coronary artery disease, no recent episodes of angina or shortness of breath. The patient denies PAD or claudication symptoms. There is a history of hyperlipidemia which is being treated with a statin.   By report her recent carotid duplex is RICA 51-76% and LICA 16-07%  Current Meds  Medication Sig  . amLODipine (NORVASC) 10 MG tablet Take 1 tablet (10 mg total) by mouth daily.  Marland Kitchen aspirin 325 MG tablet Take 1 tablet (325 mg total) by mouth daily.  Marland Kitchen atorvastatin (LIPITOR) 20 MG tablet Take 1 tablet (20 mg total) by mouth daily.  . clonazePAM (KLONOPIN) 0.5 MG tablet Take 0.5 mg by mouth daily as needed.  . fexofenadine (ALLEGRA) 60 MG tablet Take 60 mg by mouth daily.  Marland Kitchen gabapentin (NEURONTIN) 300 MG capsule Take 1 capsule (300 mg total) by mouth 3 (three) times daily as needed (nerve pain).  . hydrochlorothiazide (HYDRODIURIL) 25 MG tablet Take 25 mg by mouth daily.  . traMADol (ULTRAM) 50 MG tablet Prn    Past Medical History:  Diagnosis Date  . Anxiety   . Chest pain   . Depression   . Diabetes mellitus without complication ([place])   . Heart murmur    Normal echo 2007  . History of kidney stones   . Hypertension   . MI, old    Negative nuclear stress test  2007  . Neck pain   . Stroke [facility])    mini stroke    Past Surgical History:  Procedure Laterality Date  . BACK SURGERY     x4, L4-5  . LEFT HEART CATH AND CORONARY ANGIOGRAPHY N/A 09/11/2018   Procedure: LEFT HEART CATH AND CORONARY ANGIOGRAPHY;  Surgeon: Belva Crome, MD;  Location: [facility] CV LAB;  Service: Cardiovascular;  Laterality: N/A;  . TUBAL LIGATION      Social History Social History   Tobacco Use  . Smoking status: Current Every Day Smoker    Packs/day: 1.00    Types: Cigarettes    Last attempt to quit: 04/15/2011    Years since quitting: 8.7  . Smokeless tobacco: Never Used  Substance Use Topics  . Alcohol use: Not Currently  . Drug use: Yes    Types: Marijuana    Family History Family History  Problem Relation Age of Onset  . Heart disease Mother   . Stroke Mother   . Hypertension Mother   . Diabetes Mother   . Heart disease Father 60       Died age 85  . Heart attack Father   . Hypertension Father   . Hyperlipidemia Brother   . Cancer Maternal Aunt   No family history of bleeding/clotting disorders, porphyria or autoimmune disease   No Known Allergies  REVIEW OF SYSTEMS (Negative unless checked)  Constitutional: [] Weight loss  [] Fever  [] Chills Cardiac: [x] Chest pain   [] Chest pressure   [] Palpitations   [] Shortness of breath when laying flat   [] Shortness of breath with exertion. Vascular:  [] Pain in legs with walking   [] Pain in legs at rest  [] History of DVT   [] Phlebitis   [] Swelling in legs   [] Varicose veins   [] Non-healing ulcers Pulmonary:   [] Uses home oxygen   [] Productive cough   [] Hemoptysis   [] Wheeze  [] COPD   [] Asthma Neurologic:  [] Dizziness   [] Seizures   [] History of stroke   [] History of TIA  [] Aphasia   [] Vissual changes   [] Weakness or numbness in arm   [] Weakness or numbness in leg Musculoskeletal:   [] Joint swelling   [] Joint pain   [] Low back pain Hematologic:  [] Easy bruising  [] Easy bleeding   [] Hypercoagulable  state   [] Anemic Gastrointestinal:  [] Diarrhea   [] Vomiting  [] Gastroesophageal reflux/heartburn   [] Difficulty swallowing. Genitourinary:  [] Chronic kidney disease   [] Difficult urination  [] Frequent urination   [] Blood in urine Skin:  [] Rashes   [] Ulcers  Psychological:  [] History of anxiety   []  History of major depression.  Physical Examination  Vitals:   12/27/19 1115  BP: (!) 216/74  Pulse: 66  Resp: 16  Weight: 125 lb (56.7 kg)  Height: 5' (1.524 m)   Body mass index is 24.41 kg/m. Gen: WD/WN, NAD Head: [place]/AT, No temporalis wasting.  Ear/Nose/Throat: Hearing grossly intact, nares w/o erythema or drainage, poor dentition Eyes: PER, EOMI, sclera nonicteric.  Neck: Supple, no masses.  No bruit or JVD.  Pulmonary:  Good air movement, clear to auscultation bilaterally, no use of accessory muscles.  Cardiac: RRR, normal S1, S2, no Murmurs. Vascular: bilateral carotid bruits Vessel Right Left  Radial Palpable Palpable  Brachial Palpable Palpable  Carotid Palpable Palpable  Gastrointestinal: soft, non-distended. No guarding/no peritoneal signs.  Musculoskeletal: M/S 5/5 throughout.  No deformity or atrophy.  Neurologic: CN 2-12 intact. Pain and light touch intact in extremities.  Symmetrical.  Speech is fluent. Motor exam as listed above. Psychiatric: Judgment intact, Mood & affect appropriate for pt's clinical situation. Dermatologic: No rashes or ulcers noted.  No changes consistent with cellulitis.   CBC Lab Results  Component Value Date   WBC 5.6 09/11/2018   HGB 14.2 09/11/2018   HCT 43.5 09/11/2018   MCV 91.4 09/11/2018   PLT 168 09/11/2018    BMET    Component Value Date/Time   NA 140 09/11/2018 0549   NA 138 05/25/2013 1715   K 3.4 (L) 09/11/2018 0549   K 3.4 (L) 05/25/2013 1715   CL 105 09/11/2018 0549   CL 105 05/25/2013 1715   CO2 27 09/11/2018 0549   CO2 30 05/25/2013 1715   GLUCOSE 114 (H) 09/11/2018 0549   GLUCOSE 119 (H) 05/25/2013 1715   BUN  19 09/11/2018 0549   BUN 13 05/25/2013 1715   CREATININE 0.61 09/11/2018 0549   CREATININE 0.63 05/25/2013 1715   CREATININE 0.59 08/19/2011 1206   CALCIUM 9.4 09/11/2018 0549   CALCIUM 9.8 05/25/2013 1715   GFRNONAA >60 09/11/2018 0549   GFRNONAA >60 05/25/2013 1715   GFRAA >60 09/11/2018 0549   GFRAA >60 05/25/2013 1715   CrCl cannot be calculated (Patient's most recent lab result is older than the maximum 21 days allowed.).  COAG Lab Results  Component Value Date   INR  06/15/2009    1.00 DISREGARD  NOTIFIED JENNIFER AT 1200 ON 112510 BY HOOKER,B   INR 1.0 02/19/2008    Radiology No results found.    Assessment/Plan 1. Bilateral carotid artery stenosis The patient remains asymptomatic with respect to the carotid stenosis.  However, the patient has now progressed and has a lesion the is >70%.  Patient should undergo CT angiography of the carotid arteries to define the degree of stenosis of the internal carotid arteries bilaterally and the anatomic suitability for surgery vs. intervention.  If the patient does indeed need surgery cardiac clearance will be required, once cleared the patient will be scheduled for surgery.  The risks, benefits and alternative therapies were reviewed in detail with the patient.  All questions were answered.  The patient agrees to proceed with imaging.  Continue antiplatelet therapy as prescribed. Continue management of CAD, HTN and Hyperlipidemia. Healthy heart diet, encouraged exercise at least 4 times per week.   - CT ANGIO NECK W OR WO CONTRAST; Future  2. Hypertension, unspecified type Continue antihypertensive medications as already ordered, these medications have been reviewed and there are no changes at this time.   3. Coronary artery disease of native artery of native heart with stable angina pectoris (HCC) Continue cardiac and antihypertensive medications as already ordered and reviewed, no changes at this time.  Continue  statin as ordered and reviewed, no changes at this time  Nitrates PRN for chest pain   4. Hyperlipidemia, unspecified hyperlipidemia type Continue statin as ordered and reviewed, no changes at this time    Levora Dredge, MD  12/27/2019 11:20 AM

## 2020-01-10 ENCOUNTER — Other Ambulatory Visit: Payer: Self-pay

## 2020-01-10 ENCOUNTER — Ambulatory Visit
Admission: RE | Admit: 2020-01-10 | Discharge: 2020-01-10 | Disposition: A | Discharge status: Home / Self Care | Payer: Medicare Other | Source: Ambulatory Visit | Attending: Vascular Surgery | Admitting: Vascular Surgery

## 2020-01-10 DIAGNOSIS — I6523 Occlusion and stenosis of bilateral carotid arteries: Secondary | ICD-10-CM | POA: Insufficient documentation

## 2020-01-10 LAB — POCT I-STAT CREATININE: Creatinine, Ser: 0.5 mg/dL (ref 0.44–1.00)

## 2020-01-10 MED ORDER — IOHEXOL 350 MG/ML SOLN
75.0000 mL | Freq: Once | INTRAVENOUS | Status: AC | PRN
  Administered 2020-01-10: 75 mL via INTRAVENOUS

## 2020-01-13 ENCOUNTER — Other Ambulatory Visit: Payer: Self-pay

## 2020-01-13 ENCOUNTER — Ambulatory Visit (INDEPENDENT_AMBULATORY_CARE_PROVIDER_SITE_OTHER): Payer: Medicare Other | Admitting: Vascular Surgery

## 2020-01-13 ENCOUNTER — Encounter (INDEPENDENT_AMBULATORY_CARE_PROVIDER_SITE_OTHER): Payer: Self-pay | Admitting: Vascular Surgery

## 2020-01-13 VITALS — BP 154/65 | HR 79 | Resp 17 | Ht 60.0 in | Wt 123.0 lb

## 2020-01-13 DIAGNOSIS — I639 Cerebral infarction, unspecified: Secondary | ICD-10-CM | POA: Diagnosis not present

## 2020-01-13 DIAGNOSIS — E785 Hyperlipidemia, unspecified: Secondary | ICD-10-CM

## 2020-01-13 DIAGNOSIS — I25118 Atherosclerotic heart disease of native coronary artery with other forms of angina pectoris: Secondary | ICD-10-CM

## 2020-01-13 DIAGNOSIS — I10 Essential (primary) hypertension: Secondary | ICD-10-CM

## 2020-01-13 DIAGNOSIS — I6523 Occlusion and stenosis of bilateral carotid arteries: Secondary | ICD-10-CM

## 2020-01-13 NOTE — Progress Notes (Signed)
MRN : [id number]  [name] is a 59 y.o. ([birth year]) female who presents with chief complaint of  Chief Complaint  Patient presents with  . Follow-up    CT results  .  History of Present Illness:   The patient is seen for follow up evaluation of carotid stenosis status post CT angiogram. CT scan was done 01/10/2020. Patient reports that the test went well with no problems or complications.   She continues to have syncope and episodes of weakness.  She was recently seen in the ER with anginal symptoms.  The patient denies interval amaurosis fugax. There is no recent or interval TIA symptoms or focal motor deficits. There is a documented CVA.  The patient is taking enteric-coated aspirin 81 mg daily.  There is no history of migraine headaches. There is no history of seizures.  The patient has a history of coronary artery disease, no recent episodes of angina or shortness of breath. The patient denies PAD or claudication symptoms. There is a history of hyperlipidemia which is being treated with a statin.    CT angiogram is reviewed by me personally and shows >90% right ICA stenosis consistent with calcified and soft plaque at the origin of the right internal carotid artery.   Current Meds  Medication Sig  . amLODipine (NORVASC) 10 MG tablet Take 1 tablet (10 mg total) by mouth daily.  Marland Kitchen aspirin 325 MG tablet Take 1 tablet (325 mg total) by mouth daily.  . clonazePAM (KLONOPIN) 0.5 MG tablet Take 0.5 mg by mouth daily as needed.  . fexofenadine (ALLEGRA) 60 MG tablet Take 60 mg by mouth daily.  Marland Kitchen gabapentin (NEURONTIN) 300 MG capsule Take 1 capsule (300 mg total) by mouth 3 (three) times daily as needed (nerve pain).  . hydrochlorothiazide (HYDRODIURIL) 25 MG tablet Take 25 mg by mouth daily.  . traMADol (ULTRAM) 50 MG tablet Prn    Past Medical History:  Diagnosis Date  . Anxiety   . Chest pain   . Depression   . Diabetes mellitus without complication (HCC)   .  Heart murmur    Normal echo 2007  . History of kidney stones   . Hypertension   . MI, old    Negative nuclear stress test 2007  . Neck pain   . Stroke [facility])    mini stroke    Past Surgical History:  Procedure Laterality Date  . BACK SURGERY     x4, L4-5  . LEFT HEART CATH AND CORONARY ANGIOGRAPHY N/A 09/11/2018   Procedure: LEFT HEART CATH AND CORONARY ANGIOGRAPHY;  Surgeon: Lyn Records, MD;  Location: [facility] INVASIVE CV LAB;  Service: Cardiovascular;  Laterality: N/A;  . TUBAL LIGATION      Social History Social History   Tobacco Use  . Smoking status: Current Every Day Smoker    Packs/day: 1.00    Types: Cigarettes    Last attempt to quit: 04/15/2011    Years since quitting: 8.7  . Smokeless tobacco: Never Used  Vaping Use  . Vaping Use: Never used  Substance Use Topics  . Alcohol use: Not Currently  . Drug use: Yes    Types: Marijuana    Family History Family History  Problem Relation Age of Onset  . Heart disease Mother   . Stroke Mother   . Hypertension Mother   . Diabetes Mother   . Heart disease Father 110       Died age 79  . Heart attack Father   .  Hypertension Father   . Hyperlipidemia Brother   . Cancer Maternal Aunt     No Known Allergies   REVIEW OF SYSTEMS (Negative unless checked)  Constitutional: [] Weight loss  [] Fever  [] Chills Cardiac: [x] Chest pain   [] Chest pressure   [] Palpitations   [] Shortness of breath when laying flat   [x] Shortness of breath with exertion. Vascular:  [] Pain in legs with walking   [] Pain in legs at rest  [] History of DVT   [] Phlebitis   [] Swelling in legs   [] Varicose veins   [] Non-healing ulcers Pulmonary:   [] Uses home oxygen   [] Productive cough   [] Hemoptysis   [] Wheeze  [] COPD   [] Asthma Neurologic:  [] Dizziness   [] Seizures   [x] History of stroke   [] History of TIA  [] Aphasia   [] Vissual changes   [x] Weakness or numbness in arm   [] Weakness or numbness in leg Musculoskeletal:   [] Joint swelling   [] Joint pain    [] Low back pain Hematologic:  [] Easy bruising  [] Easy bleeding   [] Hypercoagulable state   [] Anemic Gastrointestinal:  [] Diarrhea   [] Vomiting  [] Gastroesophageal reflux/heartburn   [] Difficulty swallowing. Genitourinary:  [] Chronic kidney disease   [] Difficult urination  [] Frequent urination   [] Blood in urine Skin:  [] Rashes   [] Ulcers  Psychological:  [] History of anxiety   []  History of major depression.  Physical Examination  Vitals:   01/13/20 1459  BP: (!) 154/65  Pulse: 79  Resp: 17  Weight: 123 lb (55.8 kg)  Height: 5' (1.524 m)   Body mass index is 24.02 kg/m. Gen: WD/WN, NAD Head: [place]/AT, No temporalis wasting.  Ear/Nose/Throat: Hearing grossly intact, nares w/o erythema or drainage Eyes: PER, EOMI, sclera nonicteric.  Neck: Supple, no large masses.   Pulmonary:  Good air movement, no audible wheezing bilaterally, no use of accessory muscles.  Cardiac: RRR, no JVD Vascular:  Bilateral carotid bruit Vessel Right Left  Radial Palpable Palpable  Carotid Palpable Palpable  Gastrointestinal: Non-distended. No guarding/no peritoneal signs.  Musculoskeletal: M/S 5/5 throughout.  No deformity or atrophy.  Neurologic: CN 2-12 intact. Symmetrical.  Speech is fluent. Motor exam as listed above. Psychiatric: Judgment intact, Mood & affect appropriate for pt's clinical situation. Dermatologic: No rashes or ulcers noted.  No changes consistent with cellulitis.   CBC Lab Results  Component Value Date   WBC 5.6 09/11/2018   HGB 14.2 09/11/2018   HCT 43.5 09/11/2018   MCV 91.4 09/11/2018   PLT 168 09/11/2018    BMET    Component Value Date/Time   NA 140 09/11/2018 0549   NA 138 05/25/2013 1715   K 3.4 (L) 09/11/2018 0549   K 3.4 (L) 05/25/2013 1715   CL 105 09/11/2018 0549   CL 105 05/25/2013 1715   CO2 27 09/11/2018 0549   CO2 30 05/25/2013 1715   GLUCOSE 114 (H) 09/11/2018 0549   GLUCOSE 119 (H) 05/25/2013 1715   BUN 19 09/11/2018 0549   BUN 13 05/25/2013 1715     CREATININE 0.50 01/10/2020 1126   CREATININE 0.63 05/25/2013 1715   CREATININE 0.59 08/19/2011 1206   CALCIUM 9.4 09/11/2018 0549   CALCIUM 9.8 05/25/2013 1715   GFRNONAA >60 09/11/2018 0549   GFRNONAA >60 05/25/2013 1715   GFRAA >60 09/11/2018 0549   GFRAA >60 05/25/2013 1715   Estimated Creatinine Clearance: 58.6 mL/min (by C-G formula based on SCr of 0.5 mg/dL).  COAG Lab Results  Component Value Date   INR  06/15/2009    1.00 DISREGARD  NOTIFIED JENNIFER AT 1200 ON 112510 BY HOOKER,B   INR 1.0 02/19/2008    Radiology CT ANGIO NECK W OR WO CONTRAST  Result Date: 01/10/2020 CLINICAL DATA:  Carotid artery stenosis. EXAM: CT ANGIOGRAPHY NECK TECHNIQUE: Multidetector CT imaging of the neck was performed using the standard protocol during bolus administration of intravenous contrast. Multiplanar CT image reconstructions and MIPs were obtained to evaluate the vascular anatomy. Carotid stenosis measurements (when applicable) are obtained utilizing NASCET criteria, using the distal internal carotid diameter as the denominator. CONTRAST:  37mL OMNIPAQUE IOHEXOL 350 MG/ML SOLN COMPARISON:  None. FINDINGS: Skeleton: C4-6 ACDF Other neck: Normal pharynx, larynx and major salivary glands. No cervical lymphadenopathy. Unremarkable thyroid gland. Upper chest: No pneumothorax or pleural effusion. No nodules or masses. Aortic arch: There is mild calcific atherosclerosis of the aortic arch. There is no aneurysm, dissection or hemodynamically significant stenosis of the visualized ascending aorta and aortic arch. Conventional 3 vessel aortic branching pattern. The visualized proximal subclavian arteries are widely patent. Right carotid system: --Common carotid artery: Widely patent origin without common carotid artery dissection or aneurysm. --Internal carotid artery: No dissection, occlusion or aneurysm. Mixed calcified and non-calcified atherosclerotic disease at the bifurcation, extending into the  internal carotid artery, resulting in 57% stenosis. --External carotid artery: No acute abnormality. Left carotid system: --Common carotid artery: Widely patent origin without common carotid artery dissection or aneurysm. --Internal carotid artery:No dissection, occlusion or aneurysm. Mild atherosclerotic calcification at the carotid bifurcation without hemodynamically significant stenosis. --External carotid artery: No acute abnormality. Vertebral arteries: Left dominant configuration. Both origins are normal. No dissection, occlusion or flow-limiting stenosis to the vertebrobasilar confluence. Review of the MIP images confirms the above findings IMPRESSION: 1. 57% stenosis of the proximal right internal carotid artery by NASCET criteria, secondary to mixed density atherosclerotic disease. 2. Mixed density atherosclerosis of the left internal carotid artery without hemodynamically significant stenosis by NASCET criteria. 3. Aortic Atherosclerosis (ICD10-I70.0) and Emphysema (ICD10-J43.9). Electronically Signed   By: Deatra Robinson M.D.   On: 01/10/2020 21:02      Assessment/Plan 1. Cerebrovascular accident (CVA), unspecified mechanism (HCC) Continue antiplatelet therapy and lipitor  2. Symptomatic stenosis of both carotid arteries without infarction Recommend:  The patient is symptomatic with respect to the carotid stenosis.  The patient now has progressed and has a right ICA lesion that is >90%.  Patient's CT angiography of the carotid arteries confirms >90% right ICA stenosis.  The anatomical considerations support stenting over surgery.  This was discussed in detail with the patient.  The risks, benefits and alternative therapies were reviewed in detail with the patient.  All questions were answered.  The patient agrees to proceed with stenting of the right carotid artery.  Continue antiplatelet therapy as prescribed. Continue management of CAD, HTN and Hyperlipidemia. Healthy heart diet,  encouraged exercise at least 4 times per week.   3. Coronary artery disease of native artery of native heart with stable angina pectoris (HCC) Continue cardiac and antihypertensive medications as already ordered and reviewed, no changes at this time.  Continue statin as ordered and reviewed, no changes at this time  Nitrates PRN for chest pain   4. Hypertension, unspecified type Continue antihypertensive medications as already ordered, these medications have been reviewed and there are no changes at this time.   5. Dyslipidemia Continue statin as ordered and reviewed, no changes at this time     Levora Dredge, MD  01/13/2020 3:30 PM

## 2020-01-14 ENCOUNTER — Telehealth (INDEPENDENT_AMBULATORY_CARE_PROVIDER_SITE_OTHER): Payer: Self-pay

## 2020-01-14 NOTE — Telephone Encounter (Signed)
Spoke with the patient and she is scheduled with Dr. Gilda Crease for a right carotid stent placement on 01/26/20 with a 6:45 am arrival time to the [facility]. Patient will do covid testing on 01/24/20 between 8-1 pm at the [facility]. Pre-procedure instructions were discussed and will be mailed.

## 2020-01-16 ENCOUNTER — Encounter (INDEPENDENT_AMBULATORY_CARE_PROVIDER_SITE_OTHER): Payer: Self-pay | Admitting: Vascular Surgery

## 2020-01-25 ENCOUNTER — Other Ambulatory Visit: Payer: Self-pay

## 2020-01-25 ENCOUNTER — Other Ambulatory Visit (INDEPENDENT_AMBULATORY_CARE_PROVIDER_SITE_OTHER): Payer: Self-pay | Admitting: Nurse Practitioner

## 2020-01-25 ENCOUNTER — Other Ambulatory Visit
Admission: RE | Admit: 2020-01-25 | Discharge: 2020-01-25 | Disposition: A | Discharge status: Home / Self Care | Payer: Medicare Other | Source: Ambulatory Visit | Attending: Vascular Surgery | Admitting: Vascular Surgery

## 2020-01-25 LAB — SARS CORONAVIRUS 2 (TAT 6-24 HRS): SARS Coronavirus 2: NEGATIVE

## 2020-01-26 ENCOUNTER — Encounter
Admission: RE | Disposition: A | Discharge status: Home / Self Care | Payer: Self-pay | Source: Home / Self Care | Attending: Vascular Surgery

## 2020-01-26 ENCOUNTER — Inpatient Hospital Stay
Admission: RE | Admit: 2020-01-26 | Discharge: 2020-01-27 | DRG: 036 | Disposition: A | Discharge status: Home / Self Care | Payer: Medicare Other | Attending: Vascular Surgery | Admitting: Vascular Surgery

## 2020-01-26 ENCOUNTER — Encounter: Payer: Self-pay | Admitting: Vascular Surgery

## 2020-01-26 ENCOUNTER — Other Ambulatory Visit: Payer: Self-pay

## 2020-01-26 DIAGNOSIS — I6521 Occlusion and stenosis of right carotid artery: Principal | ICD-10-CM | POA: Diagnosis present

## 2020-01-26 DIAGNOSIS — Z823 Family history of stroke: Secondary | ICD-10-CM

## 2020-01-26 DIAGNOSIS — I25118 Atherosclerotic heart disease of native coronary artery with other forms of angina pectoris: Secondary | ICD-10-CM | POA: Diagnosis present

## 2020-01-26 DIAGNOSIS — F329 Major depressive disorder, single episode, unspecified: Secondary | ICD-10-CM | POA: Diagnosis present

## 2020-01-26 DIAGNOSIS — Z20822 Contact with and (suspected) exposure to covid-19: Secondary | ICD-10-CM | POA: Diagnosis present

## 2020-01-26 DIAGNOSIS — I252 Old myocardial infarction: Secondary | ICD-10-CM | POA: Diagnosis not present

## 2020-01-26 DIAGNOSIS — E119 Type 2 diabetes mellitus without complications: Secondary | ICD-10-CM | POA: Diagnosis present

## 2020-01-26 DIAGNOSIS — Z8249 Family history of ischemic heart disease and other diseases of the circulatory system: Secondary | ICD-10-CM | POA: Diagnosis not present

## 2020-01-26 DIAGNOSIS — Z833 Family history of diabetes mellitus: Secondary | ICD-10-CM | POA: Diagnosis not present

## 2020-01-26 DIAGNOSIS — F419 Anxiety disorder, unspecified: Secondary | ICD-10-CM | POA: Diagnosis present

## 2020-01-26 DIAGNOSIS — E785 Hyperlipidemia, unspecified: Secondary | ICD-10-CM | POA: Diagnosis present

## 2020-01-26 DIAGNOSIS — Z8673 Personal history of transient ischemic attack (TIA), and cerebral infarction without residual deficits: Secondary | ICD-10-CM

## 2020-01-26 DIAGNOSIS — I6529 Occlusion and stenosis of unspecified carotid artery: Secondary | ICD-10-CM | POA: Diagnosis present

## 2020-01-26 DIAGNOSIS — I10 Essential (primary) hypertension: Secondary | ICD-10-CM | POA: Diagnosis present

## 2020-01-26 DIAGNOSIS — J449 Chronic obstructive pulmonary disease, unspecified: Secondary | ICD-10-CM | POA: Diagnosis present

## 2020-01-26 DIAGNOSIS — Z87442 Personal history of urinary calculi: Secondary | ICD-10-CM

## 2020-01-26 HISTORY — PX: CAROTID PTA/STENT INTERVENTION: CATH118231

## 2020-01-26 LAB — MRSA PCR SCREENING: MRSA by PCR: NEGATIVE

## 2020-01-26 LAB — POCT ACTIVATED CLOTTING TIME: Activated Clotting Time: 280 seconds

## 2020-01-26 LAB — BUN: BUN: 14 mg/dL (ref 6–20)

## 2020-01-26 LAB — CREATININE, SERUM
Creatinine, Ser: 0.54 mg/dL (ref 0.44–1.00)
GFR calc Af Amer: >60 mL/min (ref >60)
GFR calc non Af Amer: >60 mL/min (ref >60)

## 2020-01-26 LAB — GLUCOSE, CAPILLARY
Glucose-Capillary: 130 mg/dL — ABNORMAL HIGH (ref 70–99)
Glucose-Capillary: 136 mg/dL — ABNORMAL HIGH (ref 70–99)
Glucose-Capillary: 229 mg/dL — ABNORMAL HIGH (ref 70–99)

## 2020-01-26 SURGERY — CAROTID PTA/STENT INTERVENTION
Anesthesia: Moderate Sedation | Laterality: Right

## 2020-01-26 MED ORDER — HYDROCHLOROTHIAZIDE 25 MG PO TABS
25.0000 mg | ORAL_TABLET | Freq: Every day | ORAL | Status: DC
  Administered 2020-01-27: 25 mg via ORAL
  Filled 2020-01-26: qty 1

## 2020-01-26 MED ORDER — ONDANSETRON HCL 4 MG/2ML IJ SOLN
4.0000 mg | Freq: Four times a day (QID) | INTRAMUSCULAR | Status: DC | PRN
  Administered 2020-01-26: 4 mg via INTRAVENOUS
  Filled 2020-01-26: qty 2

## 2020-01-26 MED ORDER — FENTANYL CITRATE (PF) 100 MCG/2ML IJ SOLN
INTRAMUSCULAR | Status: AC
  Filled 2020-01-26: qty 2

## 2020-01-26 MED ORDER — METHYLPREDNISOLONE SODIUM SUCC 125 MG IJ SOLR: 125.0000 mg | Freq: Once | INTRAMUSCULAR | Status: DC | PRN

## 2020-01-26 MED ORDER — MIDAZOLAM HCL 5 MG/5ML IJ SOLN
INTRAMUSCULAR | Status: AC
  Filled 2020-01-26: qty 5

## 2020-01-26 MED ORDER — DIPHENHYDRAMINE HCL 50 MG/ML IJ SOLN: 50.0000 mg | Freq: Once | INTRAMUSCULAR | Status: DC | PRN

## 2020-01-26 MED ORDER — DOPAMINE-DEXTROSE 3.2-5 MG/ML-% IV SOLN
INTRAVENOUS | Status: AC
  Filled 2020-01-26: qty 250

## 2020-01-26 MED ORDER — ASPIRIN EC 81 MG PO TBEC
81.0000 mg | DELAYED_RELEASE_TABLET | Freq: Every day | ORAL | Status: DC
  Filled 2020-01-26: qty 1

## 2020-01-26 MED ORDER — ALUM & MAG HYDROXIDE-SIMETH 200-200-20 MG/5ML PO SUSP: 15.0000 mL | ORAL | Status: DC | PRN

## 2020-01-26 MED ORDER — HEPARIN SODIUM (PORCINE) 1000 UNIT/ML IJ SOLN
INTRAMUSCULAR | Status: AC
  Filled 2020-01-26: qty 1

## 2020-01-26 MED ORDER — FENTANYL CITRATE (PF) 100 MCG/2ML IJ SOLN
INTRAMUSCULAR | Status: DC | PRN
  Administered 2020-01-26: 50 ug via INTRAVENOUS
  Administered 2020-01-26: 25 ug via INTRAVENOUS

## 2020-01-26 MED ORDER — CEFAZOLIN SODIUM-DEXTROSE 2-4 GM/100ML-% IV SOLN
2.0000 g | Freq: Three times a day (TID) | INTRAVENOUS | Status: AC
  Administered 2020-01-26 (×2): 2 g via INTRAVENOUS
  Filled 2020-01-26 (×2): qty 100

## 2020-01-26 MED ORDER — ONDANSETRON HCL 4 MG/2ML IJ SOLN: 4.0000 mg | Freq: Four times a day (QID) | INTRAMUSCULAR | Status: DC | PRN

## 2020-01-26 MED ORDER — PHENOL 1.4 % MT LIQD
1.0000 | OROMUCOSAL | Status: DC | PRN
  Filled 2020-01-26: qty 177

## 2020-01-26 MED ORDER — HEPARIN SODIUM (PORCINE) 1000 UNIT/ML IJ SOLN
INTRAMUSCULAR | Status: DC | PRN
  Administered 2020-01-26: 7000 [IU] via INTRAVENOUS
  Administered 2020-01-26: 2000 [IU] via INTRAVENOUS

## 2020-01-26 MED ORDER — CLOPIDOGREL BISULFATE 75 MG PO TABS: 75.0000 mg | ORAL_TABLET | Freq: Every day | ORAL | Status: DC

## 2020-01-26 MED ORDER — CHLORHEXIDINE GLUCONATE CLOTH 2 % EX PADS
6.0000 | MEDICATED_PAD | Freq: Every day | CUTANEOUS | Status: DC
  Administered 2020-01-26: 6 via TOPICAL

## 2020-01-26 MED ORDER — CLOPIDOGREL BISULFATE 75 MG PO TABS
ORAL_TABLET | ORAL | Status: AC
  Administered 2020-01-26: 300 mg via ORAL
  Filled 2020-01-26: qty 4

## 2020-01-26 MED ORDER — DOCUSATE SODIUM 100 MG PO CAPS
100.0000 mg | ORAL_CAPSULE | Freq: Every day | ORAL | Status: DC
  Administered 2020-01-27: 100 mg via ORAL
  Filled 2020-01-26: qty 1

## 2020-01-26 MED ORDER — ATROPINE SULFATE 1 MG/10ML IJ SOSY
PREFILLED_SYRINGE | INTRAMUSCULAR | Status: AC
  Filled 2020-01-26: qty 10

## 2020-01-26 MED ORDER — CEFAZOLIN SODIUM-DEXTROSE 2-4 GM/100ML-% IV SOLN
INTRAVENOUS | Status: AC
  Administered 2020-01-26: 2 g via INTRAVENOUS
  Filled 2020-01-26: qty 100

## 2020-01-26 MED ORDER — KCL IN DEXTROSE-NACL 20-5-0.9 MEQ/L-%-% IV SOLN
INTRAVENOUS | Status: DC
  Filled 2020-01-26 (×4): qty 1000

## 2020-01-26 MED ORDER — SODIUM CHLORIDE 0.9 % IV SOLN: INTRAVENOUS | Status: DC

## 2020-01-26 MED ORDER — DIPHENHYDRAMINE HCL 50 MG/ML IJ SOLN
INTRAMUSCULAR | Status: DC | PRN
  Administered 2020-01-26: 25 mg via INTRAVENOUS

## 2020-01-26 MED ORDER — OXYCODONE HCL 5 MG PO TABS
5.0000 mg | ORAL_TABLET | ORAL | Status: DC | PRN
  Administered 2020-01-26 (×2): 5 mg via ORAL
  Administered 2020-01-26 – 2020-01-27 (×2): 10 mg via ORAL
  Filled 2020-01-26 (×2): qty 2
  Filled 2020-01-26 (×2): qty 1

## 2020-01-26 MED ORDER — MORPHINE SULFATE (PF) 4 MG/ML IV SOLN: 2.0000 mg | INTRAVENOUS | Status: DC | PRN

## 2020-01-26 MED ORDER — PANTOPRAZOLE SODIUM 40 MG IV SOLR
40.0000 mg | Freq: Every day | INTRAVENOUS | Status: DC
  Administered 2020-01-26: 40 mg via INTRAVENOUS
  Filled 2020-01-26: qty 40

## 2020-01-26 MED ORDER — MIDAZOLAM HCL 2 MG/ML PO SYRP: 8.0000 mg | ORAL_SOLUTION | Freq: Once | ORAL | Status: DC | PRN

## 2020-01-26 MED ORDER — PHENYLEPHRINE HCL (PRESSORS) 10 MG/ML IV SOLN
INTRAVENOUS | Status: AC
  Filled 2020-01-26: qty 1

## 2020-01-26 MED ORDER — FAMOTIDINE 20 MG PO TABS: 40.0000 mg | ORAL_TABLET | Freq: Once | ORAL | Status: DC | PRN

## 2020-01-26 MED ORDER — CEFAZOLIN SODIUM-DEXTROSE 2-4 GM/100ML-% IV SOLN: 2.0000 g | Freq: Once | INTRAVENOUS | Status: AC

## 2020-01-26 MED ORDER — ATROPINE SULFATE 1 MG/10ML IJ SOSY
PREFILLED_SYRINGE | INTRAMUSCULAR | Status: DC | PRN
  Administered 2020-01-26: 1 mg via INTRAVENOUS

## 2020-01-26 MED ORDER — ACETAMINOPHEN 325 MG RE SUPP
325.0000 mg | RECTAL | Status: DC | PRN
  Filled 2020-01-26: qty 2

## 2020-01-26 MED ORDER — METOPROLOL TARTRATE 5 MG/5ML IV SOLN: 2.0000 mg | INTRAVENOUS | Status: DC | PRN

## 2020-01-26 MED ORDER — CLOPIDOGREL BISULFATE 300 MG PO TABS: 300.0000 mg | ORAL_TABLET | ORAL | Status: AC

## 2020-01-26 MED ORDER — MIDAZOLAM HCL 2 MG/2ML IJ SOLN
INTRAMUSCULAR | Status: DC | PRN
  Administered 2020-01-26: 0.5 mg via INTRAVENOUS
  Administered 2020-01-26: 2 mg via INTRAVENOUS

## 2020-01-26 MED ORDER — ATORVASTATIN CALCIUM 20 MG PO TABS
20.0000 mg | ORAL_TABLET | Freq: Every day | ORAL | Status: DC
  Administered 2020-01-27: 20 mg via ORAL
  Filled 2020-01-26: qty 1

## 2020-01-26 MED ORDER — HYDROMORPHONE HCL 1 MG/ML IJ SOLN: 1.0000 mg | Freq: Once | INTRAMUSCULAR | Status: DC | PRN

## 2020-01-26 MED ORDER — ACETAMINOPHEN 325 MG PO TABS: 325.0000 mg | ORAL_TABLET | ORAL | Status: DC | PRN

## 2020-01-26 MED ORDER — LORATADINE 10 MG PO TABS
10.0000 mg | ORAL_TABLET | Freq: Every day | ORAL | Status: DC
  Administered 2020-01-27: 10 mg via ORAL
  Filled 2020-01-26: qty 1

## 2020-01-26 MED ORDER — GUAIFENESIN-DM 100-10 MG/5ML PO SYRP: 15.0000 mL | ORAL_SOLUTION | ORAL | Status: DC | PRN

## 2020-01-26 MED ORDER — HYDRALAZINE HCL 20 MG/ML IJ SOLN: 5.0000 mg | INTRAMUSCULAR | Status: DC | PRN

## 2020-01-26 MED ORDER — GABAPENTIN 300 MG PO CAPS: 300.0000 mg | ORAL_CAPSULE | Freq: Three times a day (TID) | ORAL | Status: DC | PRN

## 2020-01-26 MED ORDER — LABETALOL HCL 5 MG/ML IV SOLN: 10.0000 mg | INTRAVENOUS | Status: DC | PRN

## 2020-01-26 MED ORDER — DIPHENHYDRAMINE HCL 50 MG/ML IJ SOLN
INTRAMUSCULAR | Status: AC
  Filled 2020-01-26: qty 1

## 2020-01-26 MED ORDER — SODIUM CHLORIDE 0.9 % IV SOLN: 500.0000 mL | Freq: Once | INTRAVENOUS | Status: DC | PRN

## 2020-01-26 MED ORDER — AMLODIPINE BESYLATE 5 MG PO TABS
10.0000 mg | ORAL_TABLET | Freq: Every day | ORAL | Status: DC
  Administered 2020-01-27: 5 mg via ORAL
  Filled 2020-01-26: qty 2

## 2020-01-26 SURGICAL SUPPLY — 20 items
BALLN VTRAC 4.5X20X135 (BALLOONS) ×3
BALLOON VTRAC 4.5X20X135 (BALLOONS) ×1 IMPLANT
CATH ANGIO 5F PIGTAIL 100CM (CATHETERS) ×3 IMPLANT
CATH BEACON 5 .035 100 H1 TIP (CATHETERS) ×3 IMPLANT
DEVICE EMBOSHIELD NAV6 4.0-7.0 (FILTER) ×3 IMPLANT
DEVICE PRESTO INFLATION (MISCELLANEOUS) ×6 IMPLANT
DEVICE SAFEGUARD 24CM (GAUZE/BANDAGES/DRESSINGS) ×3 IMPLANT
DEVICE STARCLOSE SE CLOSURE (Vascular Products) ×3 IMPLANT
GLIDEWIRE ANGLED SS 035X260CM (WIRE) ×3 IMPLANT
KIT CAROTID MANIFOLD (MISCELLANEOUS) ×3 IMPLANT
NEEDLE ENTRY 21GA 7CM ECHOTIP (NEEDLE) ×3 IMPLANT
PACK ANGIOGRAPHY (CUSTOM PROCEDURE TRAY) ×3 IMPLANT
SET INTRO CAPELLA COAXIAL (SET/KITS/TRAYS/PACK) ×3 IMPLANT
SHEATH BRITE TIP 5FRX11 (SHEATH) ×3 IMPLANT
SHEATH SHUTTLE 6FRX80 (SHEATH) ×3 IMPLANT
STENT XACT CAR 9-7X40X136 (Permanent Stent) ×3 IMPLANT
SYR MEDRAD MARK 7 150ML (SYRINGE) ×3 IMPLANT
TUBING CONTRAST HIGH PRESS 72 (TUBING) ×3 IMPLANT
WIRE G VAS 035X260 STIFF (WIRE) ×3 IMPLANT
WIRE J 3MM .035X145CM (WIRE) ×3 IMPLANT

## 2020-01-26 NOTE — Plan of Care (Signed)
Patient recently admitted.

## 2020-01-26 NOTE — Progress Notes (Signed)
Patient complained of pain to the bladder region. Attempted to use purewick on patient without success as she was unable to relieve herself with this method. Due to patient moving with discomfort from her full bladder; bleeding and small hematoma resulted to the right groin. Patient was laid flat and pressure was applied for 15 minutes. Hematoma was expressed while holding pressure on the patients groin. Gauze and tegaderm was applied to the site. Level 0 noted to the area. In and out urinary catheterization verbal order was obtained by Dr. Gilda Crease to alleviate continued urinary discomfort. Patient is to lie flat for another 30 minutes or longer if need be. Vascular lab informed.   Romelle Starcher, RN 01/26/2020

## 2020-01-26 NOTE — H&P (Signed)
 [facility] History & Physical Update  The patient was interviewed and re-examined.  The patient's previous History and Physical has been reviewed and is unchanged.  There is no change in the plan of care. We plan to proceed with the scheduled procedure.  Levora Dredge, MD  01/26/2020, 7:49 AM

## 2020-01-26 NOTE — Op Note (Signed)
OPERATIVE NOTE DATE: 01/26/2020  PROCEDURE: 1.  Ultrasound guidance for vascular access right femoral artery 2.  Placement of a 9 x 7 exact stent with the use of the NAV-6 embolic protection device in the right internal carotid artery  PRE-OPERATIVE DIAGNOSIS: 1.  Symptomatic right carotid artery stenosis. 2.  Syncope associated with visual changes  POST-OPERATIVE DIAGNOSIS:  Same as above  SURGEON: Levora Dredge  ASSISTANT(S): Dr. Barbara Cower dew  ANESTHESIA: local/MCS  ESTIMATED BLOOD LOSS: 100 cc  CONTRAST: 50 cc  FLUORO TIME: 5.3 minutes  MODERATE CONSCIOUS SEDATION TIME: Continuous ECG pulse oximetry and cardiopulmonary monitoring was performed throughout the entire procedure by the interventional radiology nurse total sedation time was 45 minutes  FINDING(S): 1.   95% right internal carotid artery stenosis associated with significant soft plaque in mild calcifications  SPECIMEN(S):   none  INDICATIONS:   Patient is a 59 y.o. female who presents with symptomatic right internal carotid artery stenosis.  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  Risks and benefits were discussed and informed consent was obtained.   DESCRIPTION: After obtaining full informed written consent, the patient was brought back to the vascular suite and placed supine upon the table.  The patient received IV antibiotics prior to induction. Moderate conscious sedation was administered during a face to face encounter with the patient throughout the procedure with my supervision of the RN administering medicines and monitoring the patients vital signs and mental status throughout from the start of the procedure until the patient was taken to the recovery room.    After obtaining adequate sedation, the patient was  prepped and draped in the standard fashion.    A first assistant is required in order to allow for a safe and more efficient operation.  Duties include wire manipulations as well as assistance with pinning the sheath and positioning the detector for proper angle, assistance and deploying the stent in the proper position and appropriate images.  Further duties include assisting with patient positioning during the procedure.  I believe that this procedure requires a first assistant in order for it to be performed at a level in keeping with the high standards of this institution.  The right femoral artery was visualized with ultrasound and found to be widely patent. It was then accessed under direct ultrasound guidance without difficulty with a micropuncture needle. A permanent image was recorded.  A microwire was then advanced without difficulty under fluoroscopic guidance followed by a micro-sheath.  A J-wire was placed and we then placed a 6 French sheath. The patient was then heparinized and a total of 9000 units of intravenous heparin were given and an ACT was checked to confirm successful anticoagulation.   A pigtail catheter was then placed into the ascending aorta. This showed type Ib aortic arch with wide patency of all the great vessels. The innominate artery was then selectively cannulated without difficulty with a H1 catheter and a stiff angled Glidewire. The catheter advanced into the mid right common carotid artery.  Cervical and cerebral carotid angiography was then performed. There were no obvious intracranial filling defects. The carotid bifurcation demonstrated greater than 95% stenosis at the origin of the right internal carotid artery extending for 20 mm distally.  I then advanced into the external carotid artery with a Glidewire  and the H1 catheter and then exchanged for the Amplatz Super Stiff wire. Over the Amplatz Super Stiff wire, a 6 [place] shuttle sheath was placed into the mid common  carotid artery. I then used the NAV-6  Embolic protection device and crossed the lesion and parked this in the distal internal carotid artery at the base of the skull.  I then selected a 9 x 7 x 40 exact stent. This was deployed across the lesion encompassing it in its entirety. A 4.5 x 20 length balloon was used to post dilate the stent. Only about a 30% residual stenosis was present after angioplasty. Completion angiogram showed normal intracranial filling without new defects. At this point I elected to terminate the procedure. The sheath was removed and StarClose closure device was deployed in the right femoral artery with excellent hemostatic result. The patient was taken to the recovery room in stable condition having tolerated the procedure well.  COMPLICATIONS: none  CONDITION: stable  Levora Dredge 01/26/2020 9:20 AM   This note was created with Dragon Medical transcription system. Any errors in dictation are purely unintentional.

## 2020-01-26 NOTE — OR Nursing (Signed)
Post stent placement pt squeezed ball left hand on command, and moved toes to command

## 2020-01-27 DIAGNOSIS — I6521 Occlusion and stenosis of right carotid artery: Principal | ICD-10-CM

## 2020-01-27 LAB — BASIC METABOLIC PANEL
Anion gap: 7 (ref 5–15)
BUN: 17 mg/dL (ref 6–20)
CO2: 28 mmol/L (ref 22–32)
Calcium: 8.8 mg/dL — ABNORMAL LOW (ref 8.9–10.3)
Chloride: 104 mmol/L (ref 98–111)
Creatinine, Ser: 0.61 mg/dL (ref 0.44–1.00)
GFR calc Af Amer: >60 mL/min (ref >60)
GFR calc non Af Amer: >60 mL/min (ref >60)
Glucose, Bld: 141 mg/dL — ABNORMAL HIGH (ref 70–99)
Potassium: 3.5 mmol/L (ref 3.5–5.1)
Sodium: 139 mmol/L (ref 135–145)

## 2020-01-27 LAB — CBC
HCT: 33.4 % — ABNORMAL LOW (ref 36.0–46.0)
Hemoglobin: 11.7 g/dL — ABNORMAL LOW (ref 12.0–15.0)
MCH: 30.1 pg (ref 26.0–34.0)
MCHC: 35 g/dL (ref 30.0–36.0)
MCV: 85.9 fL (ref 80.0–100.0)
Platelets: 169 10*3/uL (ref 150–400)
RBC: 3.89 MIL/uL (ref 3.87–5.11)
RDW: 13.1 % (ref 11.5–15.5)
WBC: 8.2 10*3/uL (ref 4.0–10.5)
nRBC: 0 % (ref 0.0–0.2)

## 2020-01-27 MED ORDER — OXYCODONE HCL 5 MG PO TABS: 5.0000 mg | ORAL_TABLET | Freq: Four times a day (QID) | ORAL | 0 refills | Status: DC | PRN

## 2020-01-27 MED ORDER — CLOPIDOGREL BISULFATE 75 MG PO TABS: 75.0000 mg | ORAL_TABLET | Freq: Every day | ORAL | 3 refills | Status: DC

## 2020-01-27 NOTE — Discharge Summary (Signed)
[facility]    Discharge Summary  Patient ID:  [name] MRN: [id number] DOB/AGE: [birth year] 59 y.o.  Admit date: 01/26/2020 Discharge date: 01/27/2020 Date of Surgery: 01/26/2020 Surgeon: Surgeon(s): Schnier, Latina Craver, MD Annice Needy, MD  Admission Diagnosis: Symptomatic carotid artery narrowing without infarction [I65.29]  Discharge Diagnoses:  Symptomatic carotid artery narrowing without infarction [I65.29]  Secondary Diagnoses: Past Medical History:  Diagnosis Date  . Anxiety   . Chest pain   . Depression   . Diabetes mellitus without complication (HCC)   . Heart murmur    Normal echo 2007  . History of kidney stones   . Hypertension   . MI, old    Negative nuclear stress test 2007  . Neck pain   . Stroke [facility])    mini stroke   Procedure(s): 01/26/20: 1.  Ultrasound guidance for vascular access right femoral artery 2.  Placement of a 9 x 7 exact stent with the use of the NAV-6 embolic protection device in the right internal carotid artery  Discharged Condition: Good  HPI  / Hospital Course:  Patient is a 59 y.o. female who presents with symptomatic right internal carotid artery stenosis.  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  Risks and benefits were discussed and informed consent was obtained.   On January 26, 2020 the patient underwent: 3.  Ultrasound guidance for vascular access right femoral artery 4.  Placement of a 9 x 7 exact stent with the use of the NAV-6 embolic protection device in the right internal carotid artery  She tolerated procedure well was transferred from the recovery room to the ICU for observation overnight.  Patient's night of surgery was unremarkable.  During the patient's brief inpatient stay, her diet was advanced,  pain was controlled with use of p.o. pain medication, she was urinating on her own, and ambulating at baseline.  Day of discharge, the patient was afebrile with stable vital signs.  Physical exam:  Alert and oriented x3, no acute distress Face: Symmetrical, tongue midline Neck: No swelling, bruising trachea midline Cardiovascular: Regular rate and rhythm Pulmonary: Clear to auscultation bilaterally Abdomen: Soft, nontender, nondistended, bowel sounds Groin: Access site clean dry and intact Extremity: Warm distally in toes Neurological: Tach, no deficits  Labs: As below  Complications: None  Consults: None  Significant Diagnostic Studies: CBC Lab Results  Component Value Date   WBC 8.2 01/27/2020   HGB 11.7 (L) 01/27/2020   HCT 33.4 (L) 01/27/2020   MCV 85.9 01/27/2020   PLT 169 01/27/2020   BMET    Component Value Date/Time   NA 139 01/27/2020 0703   NA 138 05/25/2013 1715   K 3.5 01/27/2020 0703   K 3.4 (L) 05/25/2013 1715   CL 104 01/27/2020 0703   CL 105 05/25/2013 1715   CO2 28 01/27/2020 0703   CO2 30 05/25/2013 1715   GLUCOSE 141 (H) 01/27/2020 0703   GLUCOSE 119 (H) 05/25/2013 1715   BUN 17 01/27/2020 0703   BUN 13 05/25/2013 1715   CREATININE 0.61 01/27/2020 0703   CREATININE 0.63 05/25/2013 1715   CREATININE 0.59 08/19/2011 1206   CALCIUM 8.8 (L) 01/27/2020 0703   CALCIUM 9.8 05/25/2013 1715   GFRNONAA >60 01/27/2020 0703   GFRNONAA >60 05/25/2013 1715   GFRAA >60  01/27/2020 0703   GFRAA >60 05/25/2013 1715   COAG Lab Results  Component Value Date   INR  06/15/2009    1.00 DISREGARD  NOTIFIED JENNIFER AT 1200 ON 112510 BY HOOKER,B   INR 1.0 02/19/2008   Disposition:  Discharge to :Home  Allergies as of 01/27/2020   No Known Allergies     Medication List    TAKE these medications   amLODipine 10 MG tablet Commonly known as: NORVASC Take 1 tablet (10 mg total) by mouth daily.   aspirin EC 81 MG tablet Take 81 mg by mouth daily. Swallow  whole.   atorvastatin 20 MG tablet Commonly known as: Lipitor Take 1 tablet (20 mg total) by mouth daily.   clopidogrel 75 MG tablet Commonly known as: PLAVIX Take 1 tablet (75 mg total) by mouth daily at 6 (six) AM. Start taking on: January 28, 2020   fexofenadine 180 MG tablet Commonly known as: ALLEGRA Take 180 mg by mouth daily.   gabapentin 300 MG capsule Commonly known as: Neurontin Take 1 capsule (300 mg total) by mouth 3 (three) times daily as needed (nerve pain). What changed: when to take this   hydrochlorothiazide 25 MG tablet Commonly known as: HYDRODIURIL Take 25 mg by mouth daily.   oxyCODONE 5 MG immediate release tablet Commonly known as: Oxy IR/ROXICODONE Take 1 tablet (5 mg total) by mouth every 6 (six) hours as needed for moderate pain or severe pain.   traMADol 50 MG tablet Commonly known as: ULTRAM Take 50 mg by mouth every 12 (twelve) hours as needed for moderate pain.      Verbal and written Discharge instructions given to the patient. Wound care per Discharge AVS  Follow-up Information    Schnier, Latina Craver, MD In 1 month.   Specialties: Vascular Surgery, Cardiology, Radiology, Vascular Surgery Why: Can see Schnier or Vivia [place]. Will need carotid duplex with visit. Patient scheduled for Mon. Aug 9 at 3pm to see Dr. Hart Carwin information: [address] [place] [address] [phone]              Signed: Tonette Lederer, PA-C  01/27/2020, 10:46 AM

## 2020-01-27 NOTE — Progress Notes (Signed)
Patient discharged. Lauren RN had already done some discharge teaching, finished by this RN. All questions answered. Patient able to verbalize instructions. Patient able to walk around unit with no difficulties.

## 2020-01-27 NOTE — Discharge Instructions (Signed)
Vascular Surgery Discharge Instructions:  1) you may remove your dressing and shower as of Friday. 2) no heavy lifting greater than 10 pounds for at least 2 weeks

## 2020-02-11 NOTE — H&P (Signed)
[facility] History & Physical Update  The patient was interviewed and re-examined.  The patient's previous History and Physical has been reviewed and is unchanged.  There is no change in the plan of care as described in the office note dated 01/13/2020. We plan to proceed with the scheduled procedure.  Levora Dredge, MD  02/11/2020, 4:59 PM

## 2020-02-25 ENCOUNTER — Other Ambulatory Visit (INDEPENDENT_AMBULATORY_CARE_PROVIDER_SITE_OTHER): Payer: Self-pay | Admitting: Vascular Surgery

## 2020-02-25 DIAGNOSIS — I6521 Occlusion and stenosis of right carotid artery: Secondary | ICD-10-CM

## 2020-02-25 DIAGNOSIS — Z959 Presence of cardiac and vascular implant and graft, unspecified: Secondary | ICD-10-CM

## 2020-02-28 ENCOUNTER — Ambulatory Visit (INDEPENDENT_AMBULATORY_CARE_PROVIDER_SITE_OTHER): Payer: Medicare Other | Admitting: Vascular Surgery

## 2020-02-28 ENCOUNTER — Encounter (INDEPENDENT_AMBULATORY_CARE_PROVIDER_SITE_OTHER): Payer: Self-pay | Admitting: Vascular Surgery

## 2020-02-28 ENCOUNTER — Other Ambulatory Visit: Payer: Self-pay

## 2020-02-28 ENCOUNTER — Ambulatory Visit (INDEPENDENT_AMBULATORY_CARE_PROVIDER_SITE_OTHER): Payer: Medicare Other

## 2020-02-28 VITALS — BP 158/69 | HR 77 | Resp 17 | Ht 60.0 in | Wt 122.0 lb

## 2020-02-28 DIAGNOSIS — I6523 Occlusion and stenosis of bilateral carotid arteries: Secondary | ICD-10-CM

## 2020-02-28 DIAGNOSIS — Z959 Presence of cardiac and vascular implant and graft, unspecified: Secondary | ICD-10-CM | POA: Diagnosis not present

## 2020-02-28 DIAGNOSIS — I6521 Occlusion and stenosis of right carotid artery: Secondary | ICD-10-CM

## 2020-03-05 ENCOUNTER — Encounter (INDEPENDENT_AMBULATORY_CARE_PROVIDER_SITE_OTHER): Payer: Self-pay | Admitting: Vascular Surgery

## 2020-03-05 NOTE — Progress Notes (Signed)
[name], female   DOB: [birth year], 59 y.o.   MRN: [id number]  Chief Complaint  Patient presents with   Follow-up    ultrasound    HPI [name] is a 59 y.o. female.    The patient is seen for follow up evaluation of carotid stenosis status post right carotid stent placement on 01/26/2020.  There were no post operative problems or complications related to the intervention.  The patient denies neck or groin pain.  The patient denies interval amaurosis fugax. There is no recent history of TIA symptoms or focal motor deficits. There is no prior documented CVA.  The patient denies headache.  The patient is taking enteric-coated aspirin 81 mg and Plavix 75 mg daily.  The patient has a history of coronary artery disease, no recent episodes of angina or shortness of breath. The patient denies PAD or claudication symptoms. There is a history of hyperlipidemia which is being treated with a statin.     Past Medical History:  Diagnosis Date   Anxiety    Chest pain    Depression    Diabetes mellitus without complication (HCC)    Heart murmur    Normal echo 2007   History of kidney stones    Hypertension    MI, old    Negative nuclear stress test 2007   Neck pain    Stroke [facility])    mini stroke    Past Surgical History:  Procedure Laterality Date   BACK SURGERY     x4, L4-5   CAROTID PTA/STENT INTERVENTION Right 01/26/2020   Procedure: CAROTID PTA/STENT INTERVENTION;  Surgeon: Renford Dills, MD;  Location: [facility] INVASIVE CV LAB;  Service: Cardiovascular;  Laterality: Right;   LEFT HEART CATH AND CORONARY ANGIOGRAPHY N/A 09/11/2018   Procedure: LEFT HEART CATH AND CORONARY ANGIOGRAPHY;  Surgeon: Lyn Records, MD;  Location: [facility] INVASIVE CV LAB;  Service: Cardiovascular;  Laterality: N/A;   TUBAL LIGATION        No Known Allergies  Current Outpatient Medications  Medication Sig Dispense Refill   amLODipine (NORVASC) 10 MG  tablet Take 1 tablet (10 mg total) by mouth daily. 90 tablet 3   aspirin EC 81 MG tablet Take 81 mg by mouth daily. Swallow whole.     clopidogrel (PLAVIX) 75 MG tablet Take 1 tablet (75 mg total) by mouth daily at 6 (six) AM. 90 tablet 3   fexofenadine (ALLEGRA) 180 MG tablet Take 180 mg by mouth daily.      gabapentin (NEURONTIN) 300 MG capsule Take 1 capsule (300 mg total) by mouth 3 (three) times daily as needed (nerve pain). (Patient taking differently: Take 300 mg by mouth at bedtime. ) 90 capsule 0   hydrochlorothiazide (HYDRODIURIL) 25 MG tablet Take 25 mg by mouth daily.     oxyCODONE (OXY IR/ROXICODONE) 5 MG immediate release tablet Take 1 tablet (5 mg total) by mouth every 6 (six) hours as needed for moderate pain or severe pain. 28 tablet 0   atorvastatin (LIPITOR) 20 MG tablet Take 1 tablet (20 mg total) by mouth daily. 30 tablet 11   traMADol (ULTRAM) 50 MG tablet Take 50 mg by mouth every 12 (twelve) hours as needed for moderate pain.  (Patient not taking: Reported on 02/28/2020)     No current facility-administered medications for this visit.        Physical Exam BP (!) 158/69    Pulse 77    Resp 17  Ht 5' (1.524 m)    Wt 122 lb (55.3 kg)    BMI 23.83 kg/m  Gen:  WD/WN, NAD Skin: incision C/D/I; neurologically intact     Assessment/Plan: 1. Symptomatic stenosis of both carotid arteries without infarction Recommend:  The patient is s/p successful right carotid stent  Duplex ultrasound preoperatively shows <40% contralateral stenosis.  Continue antiplatelet therapy as prescribed Continue management of CAD, HTN and Hyperlipidemia Healthy heart diet,  encouraged exercise at least 4 times per week  Follow up in 6 months with duplex ultrasound and physical exam based on the patient's carotid intervention and <50% stenosis of the 50 carotid artery   - VAS US CAROTID; Future      Levora Dredge 03/05/2020, 3:51 PM   This note was created with Dragon  medical transcription system.  Any errors from dictation are unintentional.

## 2020-08-28 ENCOUNTER — Ambulatory Visit (INDEPENDENT_AMBULATORY_CARE_PROVIDER_SITE_OTHER): Payer: Medicare Other | Admitting: Vascular Surgery

## 2020-08-28 ENCOUNTER — Encounter (INDEPENDENT_AMBULATORY_CARE_PROVIDER_SITE_OTHER): Payer: Self-pay

## 2020-08-28 ENCOUNTER — Encounter (INDEPENDENT_AMBULATORY_CARE_PROVIDER_SITE_OTHER): Payer: Medicare Other

## 2020-09-11 ENCOUNTER — Telehealth (INDEPENDENT_AMBULATORY_CARE_PROVIDER_SITE_OTHER): Payer: Self-pay | Admitting: Nurse Practitioner

## 2020-09-11 ENCOUNTER — Ambulatory Visit (INDEPENDENT_AMBULATORY_CARE_PROVIDER_SITE_OTHER): Payer: Medicare Other | Admitting: Nurse Practitioner

## 2020-09-11 NOTE — Telephone Encounter (Signed)
lvm for patient to callback to be r/s'd. Patient has a f/u with fb today but needs a carotid US attached to visit.

## 2020-09-11 NOTE — Telephone Encounter (Signed)
If patient comes in she will need carotid US and visit r/s'd.  Attempted to call patient twice this morning.

## 2021-03-01 ENCOUNTER — Encounter: Payer: Self-pay | Admitting: Cardiology

## 2021-04-12 ENCOUNTER — Ambulatory Visit: Payer: Medicare Other | Admitting: Cardiology

## 2021-05-02 LAB — HM PAP SMEAR: HM Pap smear: NEGATIVE

## 2021-05-07 ENCOUNTER — Encounter: Payer: Self-pay | Admitting: Cardiology

## 2021-05-07 ENCOUNTER — Ambulatory Visit (INDEPENDENT_AMBULATORY_CARE_PROVIDER_SITE_OTHER): Payer: Medicare Other

## 2021-05-07 ENCOUNTER — Other Ambulatory Visit: Payer: Self-pay

## 2021-05-07 ENCOUNTER — Ambulatory Visit (INDEPENDENT_AMBULATORY_CARE_PROVIDER_SITE_OTHER): Payer: Medicare Other | Admitting: Cardiology

## 2021-05-07 VITALS — BP 190/70 | HR 61 | Ht 60.0 in | Wt 122.0 lb

## 2021-05-07 DIAGNOSIS — F17200 Nicotine dependence, unspecified, uncomplicated: Secondary | ICD-10-CM

## 2021-05-07 DIAGNOSIS — R55 Syncope and collapse: Secondary | ICD-10-CM | POA: Diagnosis not present

## 2021-05-07 DIAGNOSIS — I10 Essential (primary) hypertension: Secondary | ICD-10-CM

## 2021-05-07 DIAGNOSIS — I2510 Atherosclerotic heart disease of native coronary artery without angina pectoris: Secondary | ICD-10-CM | POA: Diagnosis not present

## 2021-05-07 NOTE — Patient Instructions (Addendum)
Medication Instructions:  Your physician recommends that you continue on your current medications as directed. Please refer to the Current Medication list given to you today.  *If you need a refill on your cardiac medications before your next appointment, please call your pharmacy*   Lab Work: None ordered If you have labs (blood work) drawn today and your tests are completely normal, you will receive your results only by: MyChart Message (if you have MyChart) OR A paper copy in the mail If you have any lab test that is abnormal or we need to change your treatment, we will call you to review the results.   Testing/Procedures:  Your physician has requested that you have an echocardiogram. Echocardiography is a painless test that uses sound waves to create images of your heart. It provides your doctor with information about the size and shape of your heart and how well your heart's chambers and valves are working. This procedure takes approximately one hour. There are no restrictions for this procedure.   2.    Your physician has recommended that you wear a Zio XT monitor for 2 weeks.   This monitor is a medical device that records the heart's electrical activity. Doctors most often use these monitors to diagnose arrhythmias. Arrhythmias are problems with the speed or rhythm of the heartbeat. The monitor is a small device applied to your chest. You can wear one while you do your normal daily activities. While wearing this monitor if you have any symptoms to push the button and record what you felt. Once you have worn this monitor for the period of time provider prescribed (Usually 14 days), you will return the monitor device in the postage paid box. Once it is returned they will download the data collected and provide [place] with a report which the provider will then review and we will call you with those results. Important tips:  Avoid showering during the first 24 hours of wearing the  monitor. Avoid excessive sweating to help maximize wear time. Do not submerge the device, no hot tubs, and no swimming pools. Keep any lotions or oils away from the patch. After 24 hours you may shower with the patch on. Take brief showers with your back facing the shower head.  Do not remove patch once it has been placed because that will interrupt data and decrease adhesive wear time. Push the button when you have any symptoms and write down what you were feeling. Once you have completed wearing your monitor, remove and place into box which has postage paid and place in your outgoing mailbox.  If for some reason you have misplaced your box then call our office and we can provide another box and/or mail it off for you.    Follow-Up: At [facility], you and your health needs are our priority.  As part of our continuing mission to provide you with exceptional heart care, we have created designated Provider Care Teams.  These Care Teams include your primary Cardiologist (physician) and Advanced Practice Providers (APPs -  Physician Assistants and Nurse Practitioners) who all work together to provide you with the care you need, when you need it.  We recommend signing up for the patient portal called "MyChart".  Sign up information is provided on this After Visit Summary.  MyChart is used to connect with patients for Virtual Visits (Telemedicine).  Patients are able to view lab/test results, encounter notes, upcoming appointments, etc.  Non-urgent messages can be sent to your provider as  well.   To learn more about what you can do with MyChart, go to [email or web address].    Your next appointment:   Follow up after testing   The format for your next appointment:   In Person  Provider:   Debbe Odea, MD   Other Instructions
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
## 2021-05-21 DIAGNOSIS — R55 Syncope and collapse: Secondary | ICD-10-CM

## 2021-06-12 ENCOUNTER — Other Ambulatory Visit: Payer: Self-pay

## 2021-06-12 ENCOUNTER — Ambulatory Visit (INDEPENDENT_AMBULATORY_CARE_PROVIDER_SITE_OTHER): Payer: Medicare Other

## 2021-06-12 DIAGNOSIS — I2510 Atherosclerotic heart disease of native coronary artery without angina pectoris: Secondary | ICD-10-CM | POA: Diagnosis not present

## 2021-06-12 DIAGNOSIS — R55 Syncope and collapse: Secondary | ICD-10-CM

## 2021-06-12 LAB — ECHOCARDIOGRAM COMPLETE
AR max vel: 1.99 cm2
AV Area VTI: 2.29 cm2
AV Area mean vel: 2.16 cm2
AV Mean grad: 3 mmHg
AV Peak grad: 6 mmHg
Ao pk vel: 1.22 m/s
Area-P 1/2: 4.89 cm2
S' Lateral: 2.9 cm
Single Plane A4C EF: 58.9 %

## 2021-06-22 ENCOUNTER — Encounter: Payer: Self-pay | Admitting: Cardiology

## 2021-06-22 ENCOUNTER — Ambulatory Visit (INDEPENDENT_AMBULATORY_CARE_PROVIDER_SITE_OTHER): Payer: Medicare Other | Admitting: Cardiology

## 2021-06-22 ENCOUNTER — Other Ambulatory Visit: Payer: Self-pay

## 2021-06-22 VITALS — BP 180/90 | HR 70 | Ht 60.0 in | Wt 126.0 lb

## 2021-06-22 DIAGNOSIS — F17200 Nicotine dependence, unspecified, uncomplicated: Secondary | ICD-10-CM | POA: Diagnosis not present

## 2021-06-22 DIAGNOSIS — IMO0001 Reserved for inherently not codable concepts without codable children: Secondary | ICD-10-CM

## 2021-06-22 DIAGNOSIS — I2510 Atherosclerotic heart disease of native coronary artery without angina pectoris: Secondary | ICD-10-CM

## 2021-06-22 DIAGNOSIS — I10 Essential (primary) hypertension: Secondary | ICD-10-CM | POA: Diagnosis not present

## 2021-06-22 DIAGNOSIS — R55 Syncope and collapse: Secondary | ICD-10-CM

## 2021-06-22 MED ORDER — IRBESARTAN 150 MG PO TABS
150.0000 mg | ORAL_TABLET | Freq: Every day | ORAL | 5 refills | Status: AC
Start: 2021-06-22 — End: ?
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
## 2021-06-22 NOTE — Patient Instructions (Signed)
Medication Instructions:   START taking Irbesartan 150 MG once a day.  *If you need a refill on your cardiac medications before your next appointment, please call your pharmacy*   Lab Work: None ordered If you have labs (blood work) drawn today and your tests are completely normal, you will receive your results only by: MyChart Message (if you have MyChart) OR A paper copy in the mail If you have any lab test that is abnormal or we need to change your treatment, we will call you to review the results.   Testing/Procedures: None ordered   Follow-Up: At [facility], you and your health needs are our priority.  As part of our continuing mission to provide you with exceptional heart care, we have created designated Provider Care Teams.  These Care Teams include your primary Cardiologist (physician) and Advanced Practice Providers (APPs -  Physician Assistants and Nurse Practitioners) who all work together to provide you with the care you need, when you need it.  We recommend signing up for the patient portal called "MyChart".  Sign up information is provided on this After Visit Summary.  MyChart is used to connect with patients for Virtual Visits (Telemedicine).  Patients are able to view lab/test results, encounter notes, upcoming appointments, etc.  Non-urgent messages can be sent to your provider as well.   To learn more about what you can do with MyChart, go to [email or web address].    Your next appointment:   2 month(s)  The format for your next appointment:   In Person  Provider:   You may see Dr. Azucena Cecil or one of the following Advanced Practice Providers on your designated Care Team:   Nicolasa Ducking, NP Eula Listen, PA-C Cadence Fransico Michael, [place]    Other Instructions

## 2021-08-17 ENCOUNTER — Other Ambulatory Visit: Payer: Self-pay

## 2021-08-17 ENCOUNTER — Emergency Department: Payer: Medicare Other

## 2021-08-17 ENCOUNTER — Inpatient Hospital Stay
Admission: EM | Admit: 2021-08-17 | Discharge: 2021-08-21 | DRG: 123 | Disposition: A | Discharge status: Home / Self Care | Payer: Medicare Other | Attending: Internal Medicine | Admitting: Internal Medicine

## 2021-08-17 DIAGNOSIS — H5347 Heteronymous bilateral field defects: Secondary | ICD-10-CM | POA: Diagnosis present

## 2021-08-17 DIAGNOSIS — I700 Atherosclerosis of aorta: Secondary | ICD-10-CM | POA: Diagnosis present

## 2021-08-17 DIAGNOSIS — I252 Old myocardial infarction: Secondary | ICD-10-CM

## 2021-08-17 DIAGNOSIS — Z833 Family history of diabetes mellitus: Secondary | ICD-10-CM

## 2021-08-17 DIAGNOSIS — K219 Gastro-esophageal reflux disease without esophagitis: Secondary | ICD-10-CM | POA: Diagnosis present

## 2021-08-17 DIAGNOSIS — F419 Anxiety disorder, unspecified: Secondary | ICD-10-CM | POA: Diagnosis present

## 2021-08-17 DIAGNOSIS — R739 Hyperglycemia, unspecified: Secondary | ICD-10-CM | POA: Diagnosis not present

## 2021-08-17 DIAGNOSIS — Z8673 Personal history of transient ischemic attack (TIA), and cerebral infarction without residual deficits: Secondary | ICD-10-CM

## 2021-08-17 DIAGNOSIS — Z79899 Other long term (current) drug therapy: Secondary | ICD-10-CM

## 2021-08-17 DIAGNOSIS — F17210 Nicotine dependence, cigarettes, uncomplicated: Secondary | ICD-10-CM | POA: Diagnosis present

## 2021-08-17 DIAGNOSIS — K30 Functional dyspepsia: Secondary | ICD-10-CM | POA: Diagnosis present

## 2021-08-17 DIAGNOSIS — M5432 Sciatica, left side: Secondary | ICD-10-CM | POA: Diagnosis present

## 2021-08-17 DIAGNOSIS — Z8249 Family history of ischemic heart disease and other diseases of the circulatory system: Secondary | ICD-10-CM

## 2021-08-17 DIAGNOSIS — Z20822 Contact with and (suspected) exposure to covid-19: Secondary | ICD-10-CM | POA: Diagnosis present

## 2021-08-17 DIAGNOSIS — I10 Essential (primary) hypertension: Secondary | ICD-10-CM | POA: Diagnosis present

## 2021-08-17 DIAGNOSIS — I2510 Atherosclerotic heart disease of native coronary artery without angina pectoris: Secondary | ICD-10-CM | POA: Diagnosis present

## 2021-08-17 DIAGNOSIS — H3411 Central retinal artery occlusion, right eye: Principal | ICD-10-CM | POA: Diagnosis present

## 2021-08-17 DIAGNOSIS — E785 Hyperlipidemia, unspecified: Secondary | ICD-10-CM | POA: Diagnosis present

## 2021-08-17 DIAGNOSIS — I639 Cerebral infarction, unspecified: Secondary | ICD-10-CM

## 2021-08-17 DIAGNOSIS — E1165 Type 2 diabetes mellitus with hyperglycemia: Secondary | ICD-10-CM | POA: Diagnosis present

## 2021-08-17 DIAGNOSIS — Z823 Family history of stroke: Secondary | ICD-10-CM

## 2021-08-17 DIAGNOSIS — Z87442 Personal history of urinary calculi: Secondary | ICD-10-CM

## 2021-08-17 DIAGNOSIS — E1169 Type 2 diabetes mellitus with other specified complication: Secondary | ICD-10-CM | POA: Diagnosis not present

## 2021-08-17 DIAGNOSIS — E1142 Type 2 diabetes mellitus with diabetic polyneuropathy: Secondary | ICD-10-CM | POA: Diagnosis present

## 2021-08-17 DIAGNOSIS — R011 Cardiac murmur, unspecified: Secondary | ICD-10-CM | POA: Diagnosis present

## 2021-08-17 DIAGNOSIS — H5461 Unqualified visual loss, right eye, normal vision left eye: Secondary | ICD-10-CM | POA: Diagnosis present

## 2021-08-17 DIAGNOSIS — F32A Depression, unspecified: Secondary | ICD-10-CM | POA: Diagnosis present

## 2021-08-17 DIAGNOSIS — E876 Hypokalemia: Secondary | ICD-10-CM | POA: Diagnosis present

## 2021-08-17 DIAGNOSIS — J439 Emphysema, unspecified: Secondary | ICD-10-CM | POA: Diagnosis present

## 2021-08-17 DIAGNOSIS — I6389 Other cerebral infarction: Secondary | ICD-10-CM | POA: Diagnosis not present

## 2021-08-17 DIAGNOSIS — D751 Secondary polycythemia: Secondary | ICD-10-CM | POA: Diagnosis present

## 2021-08-17 DIAGNOSIS — I672 Cerebral atherosclerosis: Secondary | ICD-10-CM | POA: Diagnosis present

## 2021-08-17 DIAGNOSIS — Z83438 Family history of other disorder of lipoprotein metabolism and other lipidemia: Secondary | ICD-10-CM

## 2021-08-17 DIAGNOSIS — Z7982 Long term (current) use of aspirin: Secondary | ICD-10-CM

## 2021-08-17 DIAGNOSIS — Z720 Tobacco use: Secondary | ICD-10-CM | POA: Diagnosis not present

## 2021-08-17 DIAGNOSIS — R079 Chest pain, unspecified: Secondary | ICD-10-CM

## 2021-08-17 LAB — COMPREHENSIVE METABOLIC PANEL
ALT: 25 U/L (ref 0–44)
AST: 22 U/L (ref 15–41)
Albumin: 4.7 g/dL (ref 3.5–5.0)
Alkaline Phosphatase: 98 U/L (ref 38–126)
Anion gap: 13 (ref 5–15)
BUN: 17 mg/dL (ref 8–23)
CO2: 27 mmol/L (ref 22–32)
Calcium: 10.1 mg/dL (ref 8.9–10.3)
Chloride: 103 mmol/L (ref 98–111)
Creatinine, Ser: 0.64 mg/dL (ref 0.44–1.00)
GFR, Estimated: >60 mL/min (ref >60)
Glucose, Bld: 132 mg/dL — ABNORMAL HIGH (ref 70–99)
Potassium: 3.5 mmol/L (ref 3.5–5.1)
Sodium: 143 mmol/L (ref 135–145)
Total Bilirubin: 0.5 mg/dL (ref 0.3–1.2)
Total Protein: 8.3 g/dL — ABNORMAL HIGH (ref 6.5–8.1)

## 2021-08-17 LAB — APTT: aPTT: 30 seconds (ref 24–36)

## 2021-08-17 LAB — TROPONIN I (HIGH SENSITIVITY)
Troponin I (High Sensitivity): 6 ng/L (ref <18)
Troponin I (High Sensitivity): 8 ng/L (ref <18)

## 2021-08-17 LAB — CBC
HCT: 48 % — ABNORMAL HIGH (ref 36.0–46.0)
Hemoglobin: 15.9 g/dL — ABNORMAL HIGH (ref 12.0–15.0)
MCH: 30.2 pg (ref 26.0–34.0)
MCHC: 33.1 g/dL (ref 30.0–36.0)
MCV: 91.1 fL (ref 80.0–100.0)
Platelets: 242 10*3/uL (ref 150–400)
RBC: 5.27 MIL/uL — ABNORMAL HIGH (ref 3.87–5.11)
RDW: 13 % (ref 11.5–15.5)
WBC: 10.3 10*3/uL (ref 4.0–10.5)
nRBC: 0 % (ref 0.0–0.2)

## 2021-08-17 LAB — PROTIME-INR
INR: 1 (ref 0.8–1.2)
Prothrombin Time: 13 seconds (ref 11.4–15.2)

## 2021-08-17 LAB — DIFFERENTIAL
Abs Immature Granulocytes: 0.02 10*3/uL (ref 0.00–0.07)
Basophils Absolute: 0.1 10*3/uL (ref 0.0–0.1)
Basophils Relative: 1 %
Eosinophils Absolute: 0.1 10*3/uL (ref 0.0–0.5)
Eosinophils Relative: 1 %
Immature Granulocytes: 0 %
Lymphocytes Relative: 35 %
Lymphs Abs: 3.6 10*3/uL (ref 0.7–4.0)
Monocytes Absolute: 0.7 10*3/uL (ref 0.1–1.0)
Monocytes Relative: 7 %
Neutro Abs: 5.7 10*3/uL (ref 1.7–7.7)
Neutrophils Relative %: 56 %

## 2021-08-17 MED ORDER — SODIUM CHLORIDE 0.9% FLUSH
3.0000 mL | Freq: Once | INTRAVENOUS | Status: AC
  Administered 2021-08-17: 3 mL via INTRAVENOUS

## 2021-08-17 MED ORDER — ASPIRIN 325 MG PO TABS
325.0000 mg | ORAL_TABLET | Freq: Every day | ORAL | Status: DC
  Filled 2021-08-17: qty 1

## 2021-08-17 NOTE — ED Notes (Signed)
Lab called and informed of troponin add-on. Lab verbalized understanding.  

## 2021-08-17 NOTE — ED Triage Notes (Signed)
Pt to ED for altered vision to right eye, sent from doctor for stroke work up. Sx started Monday  Denies weakness. Clear speech. Alert and oriented.

## 2021-08-17 NOTE — ED Provider Notes (Signed)
[facility] Provider Note    Event Date/Time   First MD Initiated Contact with Patient 08/17/21 2307     (approximate)   History   Blurred Vision   HPI  [name] is a 61 y.o. female with past medical history of nonobstructive coronary disease, carotid stenosis status post right carotid stent 01/2020, hypertension hyperlipidemia and current tobacco use who presents with decreased vision in the right eye.  Symptoms started on Monday, 5 days ago.  Initially she felt like the vision was blurred but got better.  On Tuesday she woke up and was only able to see her eye spots and since that time is really not changed.  She has no associated pain.  She denies headache.  She denies difficulty with the left eye.  She denies numbness tingling or weakness in her extremities other than the chronic numbness she has in the left leg from her sciatica.  Denies chest pain or shortness of breath.  She does take an aspirin.     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   Symptomatic carotid artery narrowing without infarction 12/27/2019   CAD (coronary artery disease) 12/27/2019   History of fusion of cervical spine 03/04/2019   Neck pain 02/11/2019   Unstable angina (HCC)    HLD (hyperlipidemia) 09/09/2018   HTN (hypertension) 09/09/2018   Chest pain 09/09/2018   Mass of hand 12/12/2017   Painful intercourse 03/15/2013   At risk for diabetes mellitus 08/13/2012   Vertigo 06/26/2012   Overweight(278.02) 01/09/2012   Overweight 01/09/2012   Stroke ([place]) 04/12/2011   Tobacco abuse 02/07/2011    Dyslipidemia 02/07/2011   ANXIETY 12/01/2009   Depression 12/01/2009   HYPERTENSION, BENIGN ESSENTIAL 12/01/2009   DIVERTICULOSIS OF COLON 12/01/2009   RENAL CYST 12/01/2009   BACK PAIN, LUMBAR, CHRONIC 12/01/2009   Diverticulosis of colon 12/01/2009   Anxiety state 12/01/2009   Renal cyst 12/01/2009   Benign essential hypertension 12/01/2009   Low back pain 12/01/2009     Physical Exam  Triage Vital Signs: ED Triage Vitals  Enc Vitals Group     BP 08/17/21 1619 (!) 217/85     Pulse Rate 08/17/21 1619 (!) 101     Resp 08/17/21 1619 18     Temp 08/17/21 1619 97.7 F (36.5 C)     Temp Source 08/17/21 1619 Oral     SpO2 08/17/21 1619 96 %     Weight 08/17/21 1620 127 lb (57.6 kg)     Height 08/17/21 1620 5' (1.524 m)     Head Circumference --      Peak Flow --      Pain Score 08/17/21 1620 0     Pain Loc --      Pain Edu? --      Excl. in [place]? --     Most recent vital signs: Vitals:   08/18/21 0112 08/18/21 0439  BP: (!) 150/71 (!) 139/58  Pulse: Marland Kitchen)  58 (!) 52  Resp: 16 16  Temp:    SpO2: 98% 94%     General: Awake, no distress.  CV:  Good peripheral perfusion.  Resp:  Normal effort.  Abd:  No distention.  Neuro:             Awake, Alert, Oriented x 3    Aox3, nml speech  EOMI, face symmetric, nml tongue movement  5/5 strength in the BL upper and lower extremities  Sensation grossly intact in the BL upper and lower extremities  Finger-nose-finger intact BL  Other:   R eye with APD, extraocular movements are full Patient able to perceive light, intact temporal upper and lower visual field   ED Results / Procedures / Treatments  Labs (all labs ordered are listed, but only abnormal results are displayed) Labs Reviewed  CBC - Abnormal; Notable for the following components:      Result Value   RBC 5.27 (*)    Hemoglobin 15.9 (*)    HCT 48.0 (*)    All other components within normal limits  COMPREHENSIVE METABOLIC PANEL - Abnormal; Notable for the  following components:   Glucose, Bld 132 (*)    Total Protein 8.3 (*)    All other components within normal limits  RESP PANEL BY RT-PCR (FLU A&B, COVID) ARPGX2  PROTIME-INR  APTT  DIFFERENTIAL  SEDIMENTATION RATE  C-REACTIVE PROTEIN  HIV ANTIBODY (ROUTINE TESTING W REFLEX)  HEMOGLOBIN A1C  LIPID PANEL  TROPONIN I (HIGH SENSITIVITY)  TROPONIN I (HIGH SENSITIVITY)     EKG  EKG interpreted by myself, normal sinus rhythm, normal axis, normal intervals, ST depression in the inferior and lateral leads, poor R wave progression   RADIOLOGY I reviewed the CT scan of the brain which does not show any acute intracranial process; agree with radiology report     PROCEDURES:  Critical Care performed: No  Procedures  The patient is on the cardiac monitor to evaluate for evidence of arrhythmia and/or significant heart rate changes.   MEDICATIONS ORDERED IN ED: Medications  labetalol (NORMODYNE) injection 20 mg (20 mg Intravenous Given 08/18/21 0045)  gabapentin (NEURONTIN) capsule 300 mg (has no administration in time range)  aspirin EC tablet 81 mg (has no administration in time range)  amLODipine (NORVASC) tablet 10 mg (has no administration in time range)  atorvastatin (LIPITOR) tablet 40 mg (has no administration in time range)  hydrochlorothiazide (HYDRODIURIL) tablet 25 mg (has no administration in time range)  irbesartan (AVAPRO) tablet 150 mg (has no administration in time range)  clonazePAM (KLONOPIN) tablet 0.5 mg (has no administration in time range)   stroke: mapping our early stages of recovery book ( Does not apply Not Given 08/18/21 0255)  0.9 %  sodium chloride infusion ( Intravenous New Bag/Given 08/18/21 0254)  acetaminophen (TYLENOL) tablet 650 mg (has no administration in time range)    Or  acetaminophen (TYLENOL) 160 MG/5ML solution 650 mg (has no administration in time range)    Or  acetaminophen (TYLENOL) suppository 650 mg (has no administration in time  range)  senna-docusate (Senokot-S) tablet 1 tablet (has no administration in time range)  enoxaparin (LOVENOX) injection 40 mg (has no administration in time range)  morphine 2 MG/ML injection 2 mg (has no administration in time range)  sodium chloride flush (NS) 0.9 % injection 3 mL (3 mLs Intravenous Given 08/17/21 2358)     IMPRESSION / MDM / ASSESSMENT AND PLAN / ED COURSE  I reviewed the triage vital  signs and the nursing notes.                              Differential diagnosis includes, but is not limited to, central retinal artery occlusion, central retinal vein occlusion, branch retinal artery occlusion, optic neuritis, giant cell arteritis  Patient is a 61 year old female presents with decreased vision in the right eye since Monday.  She went to ophthalmology today at [facility] and saw Dr. Merleen Nicely.  Retinal exam was concerning for central retinal artery occlusion so she was referred to the ED for stroke work-up.  Patient notes that her decreased vision started on Monday is just blurred vision initially improved and then on Tuesday was near complete.  On exam today she does have a afferent pupillary defect on the right with full extraocular movements.  She has no associated pain.  Attempted a funduscopic exam which was somewhat difficult.  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  Patient comes with a prescription pad from [facility] where it is noted that she was found to have CRA O in the right eye and that the differential includes CVA versus GCA and that patient was requested to have a stroke work-up with ESR CRP and platelets for GCA work-up.  I agree with this plan.  Reviewed her labs here which are overall unremarkable including a troponin, CBC and coags and a CMP.  I reviewed her EKG which does have some inferior lateral ST depression but  patient has no chest pain and a negative troponin so do not feel that this is indicative of active ischemia.  Patient's blood pressure is controlled.  We will give her 325 aspirin admit to the hospitalist service.  D/w the hospitalist for admission.      FINAL CLINICAL IMPRESSION(S) / ED DIAGNOSES   Final diagnoses:  Chest pain, unspecified type     Rx / DC Orders   ED Discharge Orders     None        Note:  This document was prepared using Dragon voice recognition software and may include unintentional dictation errors.   Rada Hay, MD 08/18/21 [phone]

## 2021-08-18 ENCOUNTER — Inpatient Hospital Stay: Payer: Medicare Other

## 2021-08-18 DIAGNOSIS — H3411 Central retinal artery occlusion, right eye: Principal | ICD-10-CM

## 2021-08-18 DIAGNOSIS — F419 Anxiety disorder, unspecified: Secondary | ICD-10-CM

## 2021-08-18 DIAGNOSIS — I10 Essential (primary) hypertension: Secondary | ICD-10-CM

## 2021-08-18 DIAGNOSIS — F32A Depression, unspecified: Secondary | ICD-10-CM

## 2021-08-18 DIAGNOSIS — E785 Hyperlipidemia, unspecified: Secondary | ICD-10-CM

## 2021-08-18 LAB — CBC WITH DIFFERENTIAL/PLATELET
Abs Immature Granulocytes: 0.02 10*3/uL (ref 0.00–0.07)
Basophils Absolute: 0.1 10*3/uL (ref 0.0–0.1)
Basophils Relative: 1 %
Eosinophils Absolute: 0.2 10*3/uL (ref 0.0–0.5)
Eosinophils Relative: 2 %
HCT: 43.5 % (ref 36.0–46.0)
Hemoglobin: 14.7 g/dL (ref 12.0–15.0)
Immature Granulocytes: 0 %
Lymphocytes Relative: 34 %
Lymphs Abs: 2.7 10*3/uL (ref 0.7–4.0)
MCH: 30.4 pg (ref 26.0–34.0)
MCHC: 33.8 g/dL (ref 30.0–36.0)
MCV: 89.9 fL (ref 80.0–100.0)
Monocytes Absolute: 0.7 10*3/uL (ref 0.1–1.0)
Monocytes Relative: 9 %
Neutro Abs: 4.3 10*3/uL (ref 1.7–7.7)
Neutrophils Relative %: 54 %
Platelets: 220 10*3/uL (ref 150–400)
RBC: 4.84 MIL/uL (ref 3.87–5.11)
RDW: 12.8 % (ref 11.5–15.5)
WBC: 7.9 10*3/uL (ref 4.0–10.5)
nRBC: 0 % (ref 0.0–0.2)

## 2021-08-18 LAB — COMPREHENSIVE METABOLIC PANEL
ALT: 22 U/L (ref 0–44)
AST: 20 U/L (ref 15–41)
Albumin: 4.2 g/dL (ref 3.5–5.0)
Alkaline Phosphatase: 93 U/L (ref 38–126)
Anion gap: 7 (ref 5–15)
BUN: 20 mg/dL (ref 8–23)
CO2: 29 mmol/L (ref 22–32)
Calcium: 9.5 mg/dL (ref 8.9–10.3)
Chloride: 101 mmol/L (ref 98–111)
Creatinine, Ser: 0.61 mg/dL (ref 0.44–1.00)
GFR, Estimated: >60 mL/min (ref >60)
Glucose, Bld: 207 mg/dL — ABNORMAL HIGH (ref 70–99)
Potassium: 3.8 mmol/L (ref 3.5–5.1)
Sodium: 137 mmol/L (ref 135–145)
Total Bilirubin: 0.5 mg/dL (ref 0.3–1.2)
Total Protein: 7.5 g/dL (ref 6.5–8.1)

## 2021-08-18 LAB — MAGNESIUM: Magnesium: 2 mg/dL (ref 1.7–2.4)

## 2021-08-18 LAB — RESP PANEL BY RT-PCR (FLU A&B, COVID) ARPGX2
Influenza A by PCR: NEGATIVE
Influenza B by PCR: NEGATIVE
SARS Coronavirus 2 by RT PCR: NEGATIVE

## 2021-08-18 LAB — PHOSPHORUS: Phosphorus: 2.9 mg/dL (ref 2.5–4.6)

## 2021-08-18 LAB — LIPID PANEL
Cholesterol: 167 mg/dL (ref 0–200)
HDL: 48 mg/dL (ref >40)
LDL Cholesterol: 94 mg/dL (ref 0–99)
Total CHOL/HDL Ratio: 3.5 RATIO
Triglycerides: 125 mg/dL (ref <150)
VLDL: 25 mg/dL (ref 0–40)

## 2021-08-18 LAB — C-REACTIVE PROTEIN: CRP: 0.6 mg/dL (ref <1.0)

## 2021-08-18 LAB — SEDIMENTATION RATE: Sed Rate: 7 mm/hr (ref 0–30)

## 2021-08-18 MED ORDER — GABAPENTIN 300 MG PO CAPS: 300.0000 mg | ORAL_CAPSULE | Freq: Three times a day (TID) | ORAL | Status: DC | PRN

## 2021-08-18 MED ORDER — STROKE: EARLY STAGES OF RECOVERY BOOK: Freq: Once | Status: DC

## 2021-08-18 MED ORDER — CLOPIDOGREL BISULFATE 75 MG PO TABS: 75.0000 mg | ORAL_TABLET | Freq: Every day | ORAL | Status: DC

## 2021-08-18 MED ORDER — ALUM & MAG HYDROXIDE-SIMETH 200-200-20 MG/5ML PO SUSP
15.0000 mL | Freq: Once | ORAL | Status: AC
  Administered 2021-08-18: 22:00:00 15 mL via ORAL
  Filled 2021-08-18: qty 30

## 2021-08-18 MED ORDER — CLONAZEPAM 0.5 MG PO TABS
0.5000 mg | ORAL_TABLET | Freq: Every day | ORAL | Status: DC | PRN
  Administered 2021-08-19: 13:00:00 0.5 mg via ORAL
  Filled 2021-08-18: qty 1

## 2021-08-18 MED ORDER — IRBESARTAN 150 MG PO TABS
150.0000 mg | ORAL_TABLET | Freq: Every day | ORAL | Status: DC
  Administered 2021-08-18 – 2021-08-21 (×4): 150 mg via ORAL
  Filled 2021-08-18 (×4): qty 1

## 2021-08-18 MED ORDER — ASPIRIN EC 81 MG PO TBEC
81.0000 mg | DELAYED_RELEASE_TABLET | Freq: Every day | ORAL | Status: DC
  Administered 2021-08-18 – 2021-08-21 (×4): 81 mg via ORAL
  Filled 2021-08-18 (×4): qty 1

## 2021-08-18 MED ORDER — ATORVASTATIN CALCIUM 20 MG PO TABS
40.0000 mg | ORAL_TABLET | Freq: Every day | ORAL | Status: DC
  Administered 2021-08-18 – 2021-08-20 (×3): 40 mg via ORAL
  Filled 2021-08-18 (×4): qty 2

## 2021-08-18 MED ORDER — ACETAMINOPHEN 160 MG/5ML PO SOLN
650.0000 mg | ORAL | Status: DC | PRN
  Filled 2021-08-18: qty 20.3

## 2021-08-18 MED ORDER — SODIUM CHLORIDE 0.9 % IV SOLN: INTRAVENOUS | Status: DC

## 2021-08-18 MED ORDER — AMLODIPINE BESYLATE 10 MG PO TABS
10.0000 mg | ORAL_TABLET | Freq: Every day | ORAL | Status: DC
  Administered 2021-08-18 – 2021-08-21 (×4): 10 mg via ORAL
  Filled 2021-08-18: qty 1
  Filled 2021-08-18: qty 2
  Filled 2021-08-18 (×2): qty 1

## 2021-08-18 MED ORDER — HYDROCHLOROTHIAZIDE 25 MG PO TABS
25.0000 mg | ORAL_TABLET | Freq: Every day | ORAL | Status: DC
Start: 2021-08-18 — End: 2021-08-21
  Administered 2021-08-18 – 2021-08-21 (×4): 25 mg via ORAL
  Filled 2021-08-18 (×4): qty 1

## 2021-08-18 MED ORDER — HYDRALAZINE HCL 20 MG/ML IJ SOLN
10.0000 mg | Freq: Four times a day (QID) | INTRAMUSCULAR | Status: DC | PRN
  Administered 2021-08-18 – 2021-08-21 (×3): 10 mg via INTRAVENOUS
  Filled 2021-08-18 (×3): qty 1

## 2021-08-18 MED ORDER — NICOTINE 14 MG/24HR TD PT24: 14.0000 mg | MEDICATED_PATCH | Freq: Every day | TRANSDERMAL | Status: DC

## 2021-08-18 MED ORDER — ENOXAPARIN SODIUM 40 MG/0.4ML IJ SOSY
40.0000 mg | PREFILLED_SYRINGE | INTRAMUSCULAR | Status: DC
  Administered 2021-08-18 – 2021-08-20 (×3): 40 mg via SUBCUTANEOUS
  Filled 2021-08-18 (×3): qty 0.4

## 2021-08-18 MED ORDER — ACETAMINOPHEN 325 MG PO TABS
650.0000 mg | ORAL_TABLET | ORAL | Status: DC | PRN
  Administered 2021-08-18 – 2021-08-20 (×5): 650 mg via ORAL
  Filled 2021-08-18 (×5): qty 2

## 2021-08-18 MED ORDER — MORPHINE SULFATE (PF) 2 MG/ML IV SOLN: 2.0000 mg | INTRAVENOUS | Status: DC | PRN

## 2021-08-18 MED ORDER — SENNOSIDES-DOCUSATE SODIUM 8.6-50 MG PO TABS: 1.0000 | ORAL_TABLET | Freq: Every evening | ORAL | Status: DC | PRN

## 2021-08-18 MED ORDER — MELATONIN 5 MG PO TABS
2.5000 mg | ORAL_TABLET | Freq: Every day | ORAL | Status: DC
  Administered 2021-08-18 – 2021-08-20 (×3): 2.5 mg via ORAL
  Filled 2021-08-18 (×5): qty 0.5

## 2021-08-18 MED ORDER — ACETAMINOPHEN 325 MG PO TABS: 650.0000 mg | ORAL_TABLET | ORAL | Status: DC | PRN

## 2021-08-18 MED ORDER — NICOTINE 7 MG/24HR TD PT24
7.0000 mg | MEDICATED_PATCH | Freq: Every day | TRANSDERMAL | Status: DC
  Administered 2021-08-18 – 2021-08-21 (×4): 7 mg via TRANSDERMAL
  Filled 2021-08-18 (×4): qty 1

## 2021-08-18 MED ORDER — LABETALOL HCL 5 MG/ML IV SOLN
20.0000 mg | INTRAVENOUS | Status: DC | PRN
  Administered 2021-08-18: 20 mg via INTRAVENOUS
  Filled 2021-08-18: qty 4

## 2021-08-18 MED ORDER — ACETAMINOPHEN 650 MG RE SUPP: 650.0000 mg | RECTAL | Status: DC | PRN

## 2021-08-18 NOTE — Progress Notes (Signed)
Cross Cover PRN antihypertensive med changed to hydralazine secondary to bradycardia

## 2021-08-18 NOTE — Progress Notes (Signed)
Chart reviewed.Discussion with nsg. No ST needs identified at this time. Speech fluent and clear, no dysphagia. Please reconsult if we can further assist with this Pt.

## 2021-08-18 NOTE — Evaluation (Signed)
Occupational Therapy Evaluation Patient Details Name: [name] MRN: [id number] DOB: 01-Sep-1960 Today's Date: 08/18/2021   History of Present Illness Pt is a 61 y/o F admitted on 08/17/21 with c/c of acute onst of R eye visual loss with associated mild HA. Pt was seen by an opthamologist that afternoon & was diagnosed with R central retinal artery occlusion & was sent to the ER for further assessment for possibilty of CVA. Imaging was negative for acute intracranial abnormalities. PMH: anxiety, depression, DM2, HTN, TIA, CAD, heart murmur   Clinical Impression   Pt seen for OT evaluation this date. She is INDEP at baseline including driving and helping care for her granddaughter. She presents this date with decreased L visual field vision in R eye. She is INDEP with all aspects of mobility and appears to physically be at baseline with self care. OT completes education with pt and spouse re: stroke signs and symptoms: BEFAST, as well as stroke risk factors (even though imaging negative, just preventative). Encouraged to quit smoking in regards to how this could further impact clotting and subsequently functional capabilities for ADLs. No further OT needs perceived. Will sign off.     Recommendations for follow up therapy are one component of a multi-disciplinary discharge planning process, led by the attending physician.  Recommendations may be updated based on patient status, additional functional criteria and insurance authorization.   Follow Up Recommendations  No OT follow up    Assistance Recommended at Discharge None  Patient can return home with the following Assist for transportation    Functional Status Assessment     Equipment Recommendations  None recommended by OT    Recommendations for Other Services       Precautions / Restrictions Precautions Precautions: None Restrictions Weight Bearing Restrictions: No      Mobility Bed Mobility Overal bed mobility:  Independent                  Transfers Overall transfer level: Independent                        Balance Overall balance assessment: Mild deficits observed, not formally tested                                         ADL either performed or assessed with clinical judgement   ADL Overall ADL's : Modified independent                                       General ADL Comments: MOD I for unfamiliar environments d/t vision changes     Vision   Vision Assessment?: Yes Eye Alignment: Within Functional Limits Ocular Range of Motion: Within Functional Limits Alignment/Gaze Preference: Within Defined Limits Tracking/Visual Pursuits: Left eye does not track medially;Impaired - to be further tested in functional context Saccades: Within functional limits Convergence: Within functional limits Additional Comments: pt appears to have L visual field cut to the R eye upon testing/assessment, will need further assessment in a functional context     Perception     Praxis      Pertinent Vitals/Pain Pain Assessment Pain Assessment: No/denies pain     Hand Dominance     Extremity/Trunk Assessment Upper Extremity Assessment Upper Extremity Assessment: Overall WFL for tasks  assessed   Lower Extremity Assessment Lower Extremity Assessment: Overall WFL for tasks assessed       Communication Communication Communication: No difficulties   Cognition Arousal/Alertness: Awake/alert Behavior During Therapy: WFL for tasks assessed/performed, Restless Overall Cognitive Status: Within Functional Limits for tasks assessed                                 General Comments: Very pleasant lady, somewhat restless-potentially r/t nicotene dependence     General Comments       Exercises Other Exercises Other Exercises: OT ed with pt and spouse re: compensatory strategies for vision including scanning and anchoring. In addition,  ed with pt and spouse re: stroke s/s: BEFAST handout issued.   Shoulder Instructions      Home Living Family/patient expects to be discharged to:: Private residence Living Arrangements: Spouse/significant other Available Help at Discharge: Family;Available 24 hours/day Type of Home: Apartment Home Access: Level entry     Home Layout: One level               Home Equipment: None          Prior Functioning/Environment Prior Level of Function : Independent/Modified Independent             Mobility Comments: Independent without AD, driving, helps take care of 5 y/o granddaughter          OT Problem List: Impaired vision/perception      OT Treatment/Interventions: Visual/perceptual remediation/compensation    OT Goals(Current goals can be found in the care plan section) Acute Rehab OT Goals Patient Stated Goal: to go home and see granddtr OT Goal Formulation: All assessment and education complete, DC therapy  OT Frequency:      Co-evaluation              AM-PAC OT "6 Clicks" Daily Activity     Outcome Measure Help from another person eating meals?: None Help from another person taking care of personal grooming?: None Help from another person toileting, which includes using toliet, bedpan, or urinal?: None Help from another person bathing (including washing, rinsing, drying)?: None Help from another person to put on and taking off regular upper body clothing?: None Help from another person to put on and taking off regular lower body clothing?: None 6 Click Score: 24   End of Session    Activity Tolerance: Patient tolerated treatment well Patient left: Other (comment) (up to restroom I'ly)  OT Visit Diagnosis: Other (comment) (H53. 4 for Visual field defects)                Time: 5364-6803 OT Time Calculation (min): 25 min Charges:  OT General Charges $OT Visit: 1 Visit OT Evaluation $OT Eval Moderate Complexity: 1 Mod OT Treatments $Self  Care/Home Management : 8-22 mins  Rejeana Brock, MS, OTR/L ascom [phone] 08/18/21, 6:23 PM

## 2021-08-18 NOTE — Evaluation (Addendum)
Physical Therapy Evaluation Patient Details Name: [name] MRN: [id number] DOB: February 12, 1961 Today's Date: 08/18/2021  History of Present Illness  Pt is a 61 y/o F admitted on 08/17/21 with c/c of acute onst of R eye visual loss with associated mild HA. Pt was seen by an opthamologist that afternoon & was diagnosed with R central retinal artery occlusion & was sent to the ER for further assessment for possibilty of CVA. Imaging was negative for acute intracranial abnormalities. PMH: anxiety, depression, DM2, HTN, TIA, CAD, heart murmur  Clinical Impression  Pt seen for PT evaluation with pt agreeable. Pt reports independence without AD, driving, prior to admission. On this date pt is independent with all mobility without AD. Pt toilets during session independently. Pt does endorse diplopia but when vision assessed further pt c/o no vision L side of R eye. Pt encouraged to ambulate while in acute setting & does not need further PT services, PT to sign off at this time.    BP checked in LUE, supine: Beginning of session - 149/81 mmHg MAP 100 End of session - 176/67 mmHg MAP 93     Recommendations for follow up therapy are one component of a multi-disciplinary discharge planning process, led by the attending physician.  Recommendations may be updated based on patient status, additional functional criteria and insurance authorization.  Follow Up Recommendations No PT follow up    Assistance Recommended at Discharge None  Patient can return home with the following       Equipment Recommendations None recommended by PT  Recommendations for Other Services       Functional Status Assessment Patient has not had a recent decline in their functional status     Precautions / Restrictions Precautions Precautions: None Restrictions Weight Bearing Restrictions: No      Mobility  Bed Mobility Overal bed mobility: Independent             General bed mobility comments: supine<>sit  with HOB slightly elevated    Transfers Overall transfer level: Independent Equipment used: None                    Ambulation/Gait Ambulation/Gait assistance: Independent Gait Distance (Feet): 200 Feet Assistive device: None Gait Pattern/deviations: WFL(Within Functional Limits) Gait velocity: WNL        Stairs            Wheelchair Mobility    Modified Rankin (Stroke Patients Only)       Balance Overall balance assessment: Mild deficits observed, not formally tested                                           Pertinent Vitals/Pain Pain Assessment Pain Assessment: No/denies pain    Home Living Family/patient expects to be discharged to:: Private residence Living Arrangements: Spouse/significant other (daughter & 5 y/o granddaughter) Available Help at Discharge: Family;Available 24 hours/day Type of Home: Apartment Home Access: Level entry       Home Layout: One level Home Equipment: None      Prior Function Prior Level of Function : Independent/Modified Independent             Mobility Comments: Independent without AD, driving, helps take care of 5 y/o granddaughter       Hand Dominance        Extremity/Trunk Assessment   Upper Extremity Assessment Upper Extremity Assessment: Overall  WFL for tasks assessed    Lower Extremity Assessment Lower Extremity Assessment: Overall WFL for tasks assessed    Cervical / Trunk Assessment Cervical / Trunk Assessment: Normal  Communication   Communication: No difficulties  Cognition Arousal/Alertness: Awake/alert Behavior During Therapy: WFL for tasks assessed/performed Overall Cognitive Status: Within Functional Limits for tasks assessed                                 General Comments: Very pleasant lady        General Comments General comments (skin integrity, edema, etc.): encouraged pt to ambulate while in acute setting    Exercises      Assessment/Plan    PT Assessment Patient does not need any further PT services  PT Problem List         PT Treatment Interventions      PT Goals (Current goals can be found in the Care Plan section)  Acute Rehab PT Goals Patient Stated Goal: figure out what's going on PT Goal Formulation: With patient Time For Goal Achievement: 09/01/21 Potential to Achieve Goals: Good    Frequency       Co-evaluation               AM-PAC PT "6 Clicks" Mobility  Outcome Measure Help needed turning from your back to your side while in a flat bed without using bedrails?: None Help needed moving from lying on your back to sitting on the side of a flat bed without using bedrails?: None Help needed moving to and from a bed to a chair (including a wheelchair)?: None Help needed standing up from a chair using your arms (e.g., wheelchair or bedside chair)?: None Help needed to walk in hospital room?: None Help needed climbing 3-5 steps with a railing? : None 6 Click Score: 24    End of Session   Activity Tolerance: Patient tolerated treatment well Patient left: in bed;with call bell/phone within reach Nurse Communication: Mobility status      Time: 7408-1448 PT Time Calculation (min) (ACUTE ONLY): 13 min   Charges:   PT Evaluation $PT Eval Low Complexity: 1 Low          Aleda Grana, PT, DPT 08/18/21, 12:13 PM   Sandi Mariscal 08/18/2021, 12:10 PM

## 2021-08-18 NOTE — Consult Note (Addendum)
NEURO HOSPITALIST CONSULT NOTE   Requestig physician: Dr. Alfredia Ferguson  Reason for Consult: Acute right CRAO  History obtained from:  Patient and Chart     HPI:                                                                                                                                          [name] is an 61 y.o. female with a PMHx of mini-stroke, nonobstructive coronary disease, carotid stenosis status post right carotid stent 01/2020, DM2, hypertension, hyperlipidemia, current tobacco use and TIA who presented to the ED yesterday afternoon with acutely decreased vision in the right eye in conjunction with mild headache without dizziness. She also felt visual field defects over the nasal part of her left eye.   She states that she woke up with the symptoms, which consisted of a large "leaf shaped" central scotoma surrounded by light. At that time she was unable to see anything in the external environment with her right eye. She states that since then her vision has recovered somewhat, with the outer temporal visual fields of her right eye being able to perceive gross shapes, but with no vision in the nasal fields of her right eye. She had no left eye vision symptoms at any time. She went to see an Ophthalmologist who diagnosed her with a right CRAO and sent her to the ED for stroke evaluation.   On arrival to the ED, her BP was 217/85 with a heart rate of 101. Vital signs were otherwise normal. Influenza antigens and COVID-19 PCR came back negative. CBC showed findings consistent with hemoconcentration. EKG showed normal sinus rhythm with a rate of 97, right atrial enlargement, acute inversion anteroseptally and Q waves inferiorly.  Head CT revealed moderate chronic ischemic white matter changes with no acute intracranial abnormality.  The patient was given 325 mg p.o. aspirin in the ED.    She has not had any facial weakness, limb weakness, sensory numbness,  confusion, speech deficit, vertigo, tinnitus, CP, palpitations, cough, seizures or incontinence.   Home medications include ASA, Plavix and atorvastatin.    Past Medical History:  Diagnosis Date   Anxiety    Chest pain    Depression    Diabetes mellitus without complication ([place])    Heart murmur    Normal echo 2007   History of kidney stones    Hypertension    MI, old    Negative nuclear stress test 2007   Neck pain    Stroke [facility])    mini stroke    Past Surgical History:  Procedure Laterality Date   BACK SURGERY     x4, L4-5   CAROTID PTA/STENT INTERVENTION Right 01/26/2020   Procedure: CAROTID PTA/STENT INTERVENTION;  Surgeon: Katha Cabal, MD;  Location:  [place] [facility];  Service: Cardiovascular;  Laterality: Right;   LEFT HEART CATH AND CORONARY ANGIOGRAPHY N/A 09/11/2018   Procedure: LEFT HEART CATH AND CORONARY ANGIOGRAPHY;  Surgeon: Belva Crome, MD;  Location: [place] [facility];  Service: Cardiovascular;  Laterality: N/A;   TUBAL LIGATION      Family History  Problem Relation Age of Onset   Heart disease Mother    Stroke Mother    Hypertension Mother    Diabetes Mother    Heart disease Father 11       Died age 6   Heart attack Father    Hypertension Father    Hyperlipidemia Brother    Cancer Maternal Aunt            Social History:  reports that she has been smoking cigarettes. She has been smoking an average of 1 pack per day. She has never used smokeless tobacco. She reports that she does not currently use alcohol. She reports current drug use. Drug: Marijuana.  No Known Allergies  MEDICATIONS:                                                                                                                     Prior to Admission:  Medications Prior to Admission  Medication Sig Dispense Refill Last Dose   amLODipine (NORVASC) 10 MG tablet Take 1 tablet (10 mg total) by mouth daily. 90 tablet 3 08/17/2021   aspirin EC 81 MG tablet Take 81 mg by  mouth daily. Swallow whole.   08/17/2021   atorvastatin (LIPITOR) 40 MG tablet Take 40 mg by mouth daily.   08/17/2021   clonazePAM (KLONOPIN) 0.5 MG tablet Take 0.5 mg by mouth daily as needed.   prn at prn   hydrochlorothiazide (HYDRODIURIL) 25 MG tablet Take 25 mg by mouth daily.   08/17/2021   irbesartan (AVAPRO) 150 MG tablet Take 1 tablet (150 mg total) by mouth daily. 30 tablet 5 08/17/2021   clopidogrel (PLAVIX) 75 MG tablet Take 1 tablet (75 mg total) by mouth daily at 6 (six) AM. (Patient not taking: Reported on 08/18/2021) 90 tablet 3 Not Taking   fexofenadine (ALLEGRA) 180 MG tablet Take 180 mg by mouth daily.  (Patient not taking: Reported on 08/18/2021)   Not Taking   gabapentin (NEURONTIN) 300 MG capsule Take 1 capsule (300 mg total) by mouth 3 (three) times daily as needed (nerve pain). 90 capsule 0    Scheduled:   stroke: mapping our early stages of recovery book   Does not apply Once   amLODipine  10 mg Oral Daily   aspirin EC  81 mg Oral Daily   atorvastatin  40 mg Oral Daily   enoxaparin (LOVENOX) injection  40 mg Subcutaneous Q24H   hydrochlorothiazide  25 mg Oral Daily   irbesartan  150 mg Oral Daily   melatonin  2.5 mg Oral QHS   nicotine  7 mg Transdermal Daily   Continuous:  sodium chloride 100 mL/hr at  08/18/21 1334     ROS:                                                                                                                                       As per HPI.   Blood pressure (!) 158/72, pulse (!) 58, temperature 97.7 F (36.5 C), temperature source Oral, resp. rate 20, height 5' (1.524 m), weight 57.6 kg, SpO2 97 %.   General Examination:                                                                                                       Physical Exam  HEENT-  [place]/AT  Lungs- Respirations unlabored. Occasional mild wet cough and wheezing noted.  Extremities- Warm and well perfused   Neurological Examination Mental Status:  Awake and alert. Fully  oriented. Thought content appropriate.  Speech fluent without evidence of aphasia.  Able to follow all commands without difficulty. Cranial Nerves:  II: Right RAPD. Left pupil with normal reactivity. Right eye with loss of vision in both nasal quadrants and medial portion of temporal quadrants, with intact vision for gross shapes in a crescent-shaped distribution of the lateral portions of the right temporal visual fields. Left eye with normal visual fields.   III,IV, VI: No ptosis. EOMI. No nystagmus.  V: Temp sensation equal bilaterally VII: Smile symmetric VIII: Hearing intact to voice IX,X: No hypophonia XI: Symmetric shoulder shrug XII: Midline tongue extension Motor: Right : Upper extremity   5/5    Left:     Upper extremity   5/5  Lower extremity   5/5     Lower extremity   5/5 No pronator drift Sensory: FT intact throughout, bilaterally; no extinction to DSS. Temp sensation intact except for decreased temp sensation to distal LLE.  Deep Tendon Reflexes: 3+ and symmetric throughout Cerebellar: No ataxia with FNF bilaterally.  Gait: Deferred   Lab Results: Basic Metabolic Panel: Recent Labs  Lab 08/17/21 1619  NA 143  K 3.5  CL 103  CO2 27  GLUCOSE 132*  BUN 17  CREATININE 0.64  CALCIUM 10.1    CBC: Recent Labs  Lab 08/17/21 1619  WBC 10.3  NEUTROABS 5.7  HGB 15.9*  HCT 48.0*  MCV 91.1  PLT 242    Cardiac Enzymes: No results for input(s): CKTOTAL, CKMB, CKMBINDEX, TROPONINI in the last 168 hours.  Lipid Panel: Recent Labs  Lab 08/18/21 0731  CHOL 167  TRIG 125  HDL 48  CHOLHDL  3.5  VLDL 25  LDLCALC 94    Imaging: CT HEAD WO CONTRAST  Result Date: 08/17/2021 CLINICAL DATA:  Acute neuro deficit.  Stroke suspected. EXAM: CT HEAD WITHOUT CONTRAST TECHNIQUE: Contiguous axial images were obtained from the base of the skull through the vertex without intravenous contrast. RADIATION DOSE REDUCTION: This exam was performed according to the departmental  dose-optimization program which includes automated exposure control, adjustment of the mA and/or kV according to patient size and/or use of iterative reconstruction technique. COMPARISON:  CT brain 05/25/2013 FINDINGS: Brain: The ventricles are normal in size and configuration. The basilar cisterns are patent. No mass, mass effect, or midline shift. No acute intracranial hemorrhage is seen. No abnormal extra-axial fluid collection. Moderate periventricular and subcortical white matter patchy hypodensities are similar to prior, nonspecific but most likely secondary to chronic ischemic white matter changes. Well-circumscribed small old lacunar infarct within the inferior right cerebellar hemisphere (axial image 7, coronal image 43). Preservation of the normal cortical gray-white interface without CT evidence of an acute major vascular territorial cortical based infarction. Vascular: There are intracranial atherosclerotic calcifications. Skull: Normal. Negative for fracture or focal lesion. Sinuses/Orbits: The visualized orbits are unremarkable. The visualized paranasal sinuses and mastoid air cells are clear. Other: None. IMPRESSION:: IMPRESSION: 1. No acute intracranial process. 2. Moderate chronic ischemic white matter changes, similar to prior. Electronically Signed   By: Yvonne Kendall M.D.   On: 08/17/2021 17:26   MR ANGIO HEAD WO CONTRAST  Result Date: 08/18/2021 CLINICAL DATA:  Acute neurologic deficit.  Altered right eye vision. EXAM: MRI HEAD WITHOUT CONTRAST MRA HEAD WITHOUT CONTRAST TECHNIQUE: Multiplanar, multi-echo pulse sequences of the brain and surrounding structures were acquired without intravenous contrast. Angiographic images of the Circle of Willis were acquired using MRA technique without intravenous contrast. COMPARISON:  No pertinent prior exam. FINDINGS: MRI HEAD FINDINGS Brain: No acute infarct, mass effect or extra-axial collection. Right parietal focus of chronic microhemorrhage. Old  right cerebellar infarct. Multiple old basal ganglia small vessel infarcts. Hyperintense T2-weighted signal is moderately widespread throughout the white matter. Generalized volume loss without a clear lobar predilection. The midline structures are normal. Vascular: Major flow voids are preserved. Skull and upper cervical spine: Normal calvarium and skull base. Visualized upper cervical spine and soft tissues are normal. Sinuses/Orbits:No paranasal sinus fluid levels or advanced mucosal thickening. No mastoid or middle ear effusion. Normal orbits. MRA HEAD FINDINGS POSTERIOR CIRCULATION: --Vertebral arteries: Normal --Inferior cerebellar arteries: Normal. --Basilar artery: Normal. --Superior cerebellar arteries: Normal. --Posterior cerebral arteries: Normal. ANTERIOR CIRCULATION: --Intracranial internal carotid arteries: Normal. --Anterior cerebral arteries (ACA): Normal. --Middle cerebral arteries (MCA): Normal. ANATOMIC VARIANTS: None IMPRESSION: 1. No acute intracranial abnormality. 2. Multiple old small vessel infarcts and sequelae of chronic small vessel disease. 3. Normal intracranial MRA. Electronically Signed   By: Ulyses Jarred M.D.   On: 08/18/2021 02:55   MR BRAIN WO CONTRAST  Result Date: 08/18/2021 CLINICAL DATA:  Acute neurologic deficit.  Altered right eye vision. EXAM: MRI HEAD WITHOUT CONTRAST MRA HEAD WITHOUT CONTRAST TECHNIQUE: Multiplanar, multi-echo pulse sequences of the brain and surrounding structures were acquired without intravenous contrast. Angiographic images of the Circle of Willis were acquired using MRA technique without intravenous contrast. COMPARISON:  No pertinent prior exam. FINDINGS: MRI HEAD FINDINGS Brain: No acute infarct, mass effect or extra-axial collection. Right parietal focus of chronic microhemorrhage. Old right cerebellar infarct. Multiple old basal ganglia small vessel infarcts. Hyperintense T2-weighted signal is moderately widespread throughout the white matter.  Generalized volume  loss without a clear lobar predilection. The midline structures are normal. Vascular: Major flow voids are preserved. Skull and upper cervical spine: Normal calvarium and skull base. Visualized upper cervical spine and soft tissues are normal. Sinuses/Orbits:No paranasal sinus fluid levels or advanced mucosal thickening. No mastoid or middle ear effusion. Normal orbits. MRA HEAD FINDINGS POSTERIOR CIRCULATION: --Vertebral arteries: Normal --Inferior cerebellar arteries: Normal. --Basilar artery: Normal. --Superior cerebellar arteries: Normal. --Posterior cerebral arteries: Normal. ANTERIOR CIRCULATION: --Intracranial internal carotid arteries: Normal. --Anterior cerebral arteries (ACA): Normal. --Middle cerebral arteries (MCA): Normal. ANATOMIC VARIANTS: None IMPRESSION: 1. No acute intracranial abnormality. 2. Multiple old small vessel infarcts and sequelae of chronic small vessel disease. 3. Normal intracranial MRA. Electronically Signed   By: Ulyses Jarred M.D.   On: 08/18/2021 02:55     Assessment: 61 year old female presenting with acute right CRAO 1. Exam reveals right CRAO and visual field deficit OD consisting of loss of vision in the nasal quadrants and the medial portions of the temporal quadrants.  2. CT head:  No acute intracranial process. Moderate chronic ischemic white matter changes, similar to prior. 3. MRI brain: No acute intracranial abnormality. Multiple old small vessel infarcts and sequelae of chronic small vessel disease.  4. MRA head: Normal intracranial MRA. 5. CRP normal. ESR normal. Unlikely to be due to temporal arteritis. CRAO more likely secondary to cholesterol embolus, although cholesterol panel is unremarkable.. 5. TTE 06/12/21: No mural thrombus or valvular vegetation mentioned in the report.  6. Stroke risk factors: Prior mini-stroke, TIA, CAD, carotid stenosis status post right carotid stent 01/2020, DM2, hypertension, hyperlipidemia and current tobacco  use.  Recommendations: 1. Complete her stroke work up with carotid ultrasound, HgbA1c and cardiac telemetry. No need to obtain TTE as most recent study was 2 months ago. Adding CTA of neck to more fully visualize the internal carotid arteries.  2. Continue ASA, Plavix and atorvastatin 3. BP management per standard protocol with goal BP of 120/80 4. Glycemic control  5. Smoking cessation counseling 6. Benefits of daily light exercise by walking > 30 minutes per day was discussed with the patient.   7. Outpatient Ophthalmology follow up.  8. May need TEE to complement prior TTE if no embolic source such as unstable right ICA plaque or dissection is found with above work up.  9. Other follow up recommendations pending completion of her stroke work up.     Electronically signed: Dr. Kerney Elbe 08/18/2021, 9:46 AM

## 2021-08-18 NOTE — Progress Notes (Signed)
Care started prior to midnight in the emergency room and patient was admitted early this morning after midnight by Dr. Eugenie Norrie and I am in current agreement with his assessment and plan.  Additional changes to plan of care been made accordingly.  The patient is a 61 year old Caucasian female with a past medical history significant for but not limited to anxiety and depression, diabetes mellitus type 2, history of hypertension, she of TIA coronary artery disease as well as other comorbidities who presented to the emergency room with acute onset of right eye visual loss associate with mild headache without dizziness.  She felt that she had visual field defects over the nasal part of her left eye.  She denies any paresthesias or focal muscle weakness and no facial droop or numbness.  States that she went to her ophthalmologist who then referred her to the ED for further work-up given her visual field loss.  Ophthalmology was concerned for a central retinal artery occlusion.  She had a noncontrast CT scan done which showed mild chronic ischemic white matter changes with no acute intra cranial abnormality.  She is given 3.5 mg of aspirin as well as 650 mg of acetaminophen and admitted to medical telemetry bed.  Ophthalmology and neurology were consulted and she is diagnosed with a right central retinal artery occlusion and sent to ED for assessment of possible CVA and further work-up of this central retinal artery occlusion.  PT OT evaluated and recommending no follow-up and SLP recommended no follow-up so she was placed on a diet.  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 Cheral Marker is aware and will make further recommendations -Ophthalmology was consulted and Dr. Wallace Going tingling is denied except recommending stroke work-up with the GCA in the differential -She is undergoing an MRA and MRI of the head and MRA and MRI showed no acute intracranial abnormalities with multiple small vessel infarcts and sequela of chronic small vessel disease and a normal intracranial MRA -We will check an echocardiogram and further work-up per neurology -Lipid panel done and showed a total cholesterol/HDL ratio 3.5, cholesterol level 167, HDL 48, LDL of 94, triglycerides 125, VLDL of 25 -PT and OT recommending no follow-up and speech is signed off as well -Appreciate further neurology evaluation and care and will allow for permissive hypertension for now  Essential Hypertension -She takes Norvasc and hydrochlorothiazide as well as Avapro in the outpatient setting -Continue monitor blood pressures per protocol and allow for permissive hypertension -Last blood pressure reading was  Anxiety and depression -Continue with home Klonopin  Dyslipidemia/hyperlipidemia -Lipid panel as above -Continue with Atorvastatin 40 mg daily  Peripheral Neuropathy -Continue Gabapentin 300 mg p.o. 3 times daily  Hyperglycemia -Patient's blood sugar on admission was 132 and repeat this morning was 207 -Continue monitor blood sugars per protocol and check hemoglobin A1c -If necessary will place on sensitive NovoLog/scale insulin AC  We will continue to monitor patient's clinical response to intervention and repeat blood work and follow-up on imaging and discussion with neurology.  Ophthalmology feels they have nothing else to add for her central retinal artery confusion but recommended obtaining ESR and CRP which were  both within normal limits

## 2021-08-18 NOTE — H&P (Signed)
[place]   PATIENT NAME: [name]    MR#:  [id number]  DATE OF BIRTH:  04/28/1961  DATE OF ADMISSION:  08/17/2021  PRIMARY CARE PHYSICIAN: Remi Haggard, FNP   Patient is coming from: Home  REQUESTING/REFERRING PHYSICIAN: Rada Hay, MD  CHIEF COMPLAINT:   Chief Complaint  Patient presents with   Blurred Vision    HISTORY OF PRESENT ILLNESS:  [name] is a 61 y.o. Caucasian female with medical history significant for anxiety, depression, type 2 diabetes mellitus, hypertension, TIA and coronary artery disease, who presented to the emergency room with acute onset of acute right eyes visual loss with associated mild headache without dizziness.  She felt visual field defects over the nasal part of her left eye.  She denies any paresthesias or focal muscle weakness.  No facial droop or numbness.  No tinnitus or vertigo.  No urinary or stool incontinence.  No witnessed seizures.  She denies any chest pain or palpitations.  No cough or wheezing or dyspnea.  No bleeding diathesis.  No dysuria, oliguria or hematuria or flank pain.  The patient was seen in by the ophthalmologist this afternoon and was diagnosed with right central retinal artery occlusion.  She was sent to the emergency room for further assessment for the possibility of CVA.  ED Course: Upon presentation to the emergency room BP was 217/85 with a heart rate of 101 with otherwise normal vital signs.  Labs revealed potassium of 3.5 with otherwise unremarkable CMP.  High-sensitivity troponin I was 6 and later 8.  CBC showed hemoconcentration.  Influenza antigens and COVID-19 PCR came back negative. EKG as reviewed by me : EKG showed normal sinus rhythm with a rate of 97 with right atrial enlargement acute inversion anteroseptally with Q waves inferiorly Imaging: Noncontrast head CT scan revealed moderate chronic ischemic white matter changes with no acute intracranial abnormality.  The patient  was given 325 mg p.o. aspirin as well as 650 mg p.o. Tylenol.  She will be admitted to a medical telemetry bed for further evaluation and management. PAST MEDICAL HISTORY:   Past Medical History:  Diagnosis Date   Anxiety    Chest pain    Depression    Diabetes mellitus without complication ([place])    Heart murmur    Normal echo 2007   History of kidney stones    Hypertension    MI, old    Negative nuclear stress test 2007   Neck pain    Stroke ([place])    mini stroke    PAST SURGICAL HISTORY:   Past Surgical History:  Procedure Laterality Date   BACK SURGERY     x4, L4-5   CAROTID PTA/STENT INTERVENTION Right 01/26/2020   Procedure: CAROTID PTA/STENT INTERVENTION;  Surgeon: Katha Cabal, MD;  Location: [facility];  Service: Cardiovascular;  Laterality: Right;   LEFT HEART CATH AND CORONARY ANGIOGRAPHY N/A 09/11/2018   Procedure: LEFT HEART CATH AND CORONARY ANGIOGRAPHY;  Surgeon: Belva Crome, MD;  Location: [facility];  Service: Cardiovascular;  Laterality: N/A;   TUBAL LIGATION      SOCIAL HISTORY:   Social History   Tobacco Use   Smoking status: Every Day    Packs/day: 1.00    Types: Cigarettes    Last attempt to quit: 04/15/2011    Years since quitting: 10.3   Smokeless tobacco: Never  Substance Use Topics   Alcohol use: Not Currently  FAMILY HISTORY:   Family History  Problem Relation Age of Onset   Heart disease Mother    Stroke Mother    Hypertension Mother    Diabetes Mother    Heart disease Father 100       Died age 47   Heart attack Father    Hypertension Father    Hyperlipidemia Brother    Cancer Maternal Aunt     DRUG ALLERGIES:  No Known Allergies  REVIEW OF SYSTEMS:   ROS As per history of present illness. All pertinent systems were reviewed above. Constitutional, HEENT, cardiovascular, respiratory, GI, GU, musculoskeletal, neuro, psychiatric, endocrine, integumentary and hematologic systems were reviewed and are  otherwise negative/unremarkable except for positive findings mentioned above in the HPI.   MEDICATIONS AT HOME:   Prior to Admission medications   Medication Sig Start Date End Date Taking? Authorizing Provider  amLODipine (NORVASC) 10 MG tablet Take 1 tablet (10 mg total) by mouth daily. 05/20/13  Yes [place]yn Ege, Dayarmys, MD  aspirin EC 81 MG tablet Take 81 mg by mouth daily. Swallow whole.   Yes [provider]  atorvastatin (LIPITOR) 40 MG tablet Take 40 mg by mouth daily. 08/06/21  Yes [provider]  clonazePAM (KLONOPIN) 0.5 MG tablet Take 0.5 mg by mouth daily as needed. 08/17/21  Yes [provider]  hydrochlorothiazide (HYDRODIURIL) 25 MG tablet Take 25 mg by mouth daily. 06/04/18  Yes [provider]  irbesartan (AVAPRO) 150 MG tablet Take 1 tablet (150 mg total) by mouth daily. 06/22/21  Yes Kate Sable, MD  clopidogrel (PLAVIX) 75 MG tablet Take 1 tablet (75 mg total) by mouth daily at 6 (six) AM. Patient not taking: Reported on 08/18/2021 01/28/20   Stegmayer, Janalyn Harder, PA-C  fexofenadine (ALLEGRA) 180 MG tablet Take 180 mg by mouth daily.  Patient not taking: Reported on 08/18/2021    [provider]  gabapentin (NEURONTIN) 300 MG capsule Take 1 capsule (300 mg total) by mouth 3 (three) times daily as needed (nerve pain). 09/11/18   Norval Morton, MD      VITAL SIGNS:  Blood pressure 129/88, pulse 73, temperature 97.7 F (36.5 C), temperature source Oral, resp. rate 17, height 5' (1.524 m), weight 57.6 kg, SpO2 99 %.  PHYSICAL EXAMINATION:  Physical Exam  GENERAL:  61 y.o.-year-old Caucasian female patient lying in the bed with no acute distress.  EYES: Pupils equal, round, reactive to light and accommodation. No scleral icterus. Extraocular muscles intact.  HEENT: Head atraumatic, normocephalic. Oropharynx and nasopharynx clear.  NECK:  Supple, no jugular venous distention. No thyroid enlargement, no tenderness.   LUNGS: Normal breath sounds bilaterally, no wheezing, rales,rhonchi or crepitation. No use of accessory muscles of respiration.  CARDIOVASCULAR: Regular rate and rhythm, S1, S2 normal. No murmurs, rubs, or gallops.  ABDOMEN: Soft, nondistended, nontender. Bowel sounds present. No organomegaly or mass.  EXTREMITIES: No pedal edema, cyanosis, or clubbing.  NEUROLOGIC: Cranial nerves II through XII are intact except for nasal upper and lower right eye visual field deficit/right eye nasal hemianopsia. Muscle strength 5/5 in all extremities. Sensation intact. Gait not checked.  PSYCHIATRIC: The patient is alert and oriented x 3.  Normal affect and good eye contact. SKIN: No obvious rash, lesion, or ulcer.   LABORATORY PANEL:   CBC Recent Labs  Lab 08/17/21 1619  WBC 10.3  HGB 15.9*  HCT 48.0*  PLT 242   ------------------------------------------------------------------------------------------------------------------  Chemistries  Recent Labs  Lab 08/17/21 1619  NA 143  K 3.5  CL 103  CO2 27  GLUCOSE 132*  BUN 17  CREATININE 0.64  CALCIUM 10.1  AST 22  ALT 25  ALKPHOS 98  BILITOT 0.5   ------------------------------------------------------------------------------------------------------------------  Cardiac Enzymes No results for input(s): TROPONINI in the last 168 hours. ------------------------------------------------------------------------------------------------------------------  RADIOLOGY:  CT HEAD WO CONTRAST  Result Date: 08/17/2021 CLINICAL DATA:  Acute neuro deficit.  Stroke suspected. EXAM: CT HEAD WITHOUT CONTRAST TECHNIQUE: Contiguous axial images were obtained from the base of the skull through the vertex without intravenous contrast. RADIATION DOSE REDUCTION: This exam was performed according to the departmental dose-optimization program which includes automated exposure control, adjustment of the mA and/or kV according to patient size and/or use of  iterative reconstruction technique. COMPARISON:  CT brain 05/25/2013 FINDINGS: Brain: The ventricles are normal in size and configuration. The basilar cisterns are patent. No mass, mass effect, or midline shift. No acute intracranial hemorrhage is seen. No abnormal extra-axial fluid collection. Moderate periventricular and subcortical white matter patchy hypodensities are similar to prior, nonspecific but most likely secondary to chronic ischemic white matter changes. Well-circumscribed small old lacunar infarct within the inferior right cerebellar hemisphere (axial image 7, coronal image 43). Preservation of the normal cortical gray-white interface without CT evidence of an acute major vascular territorial cortical based infarction. Vascular: There are intracranial atherosclerotic calcifications. Skull: Normal. Negative for fracture or focal lesion. Sinuses/Orbits: The visualized orbits are unremarkable. The visualized paranasal sinuses and mastoid air cells are clear. Other: None. IMPRESSION:: IMPRESSION: 1. No acute intracranial process. 2. Moderate chronic ischemic white matter changes, similar to prior. Electronically Signed   By: Yvonne Kendall M.D.   On: 08/17/2021 17:26      IMPRESSION AND PLAN:  Principal Problem:   Central retinal artery occlusion of right eye  1.  Right central retinal artery occlusion with subsequent right eye nasal hemianopsia. - The patient will be admitted to a medical telemetry bed. - We will follow neurochecks every 4 hours for 24 hours. - We will place on aspirin as well as Plavix. - Statin therapy will be provided. - We will check fasting lipids in AM. - Neurology consult will be obtained. - I notified Dr. Cheral Marker about the patient. - Ophthalmology consult will be obtained. - I notified Dr. Binnie Kand about the patient.  2.  Essential hypertension. - We will continue Norvasc and HCTZ as well as Avapro.  3.  Anxiety and depression. - We will continue  Klonopin.  4.  Dyslipidemia. - We will continue statin therapy.  5.  Peripheral neuropathy. - We will continue Neurontin.   DVT prophylaxis: Lovenox. Code Status: full code. Family Communication:  The plan of care was discussed in details with the patient (and family). I answered all questions. The patient agreed to proceed with the above mentioned plan. Further management will depend upon hospital course. Disposition Plan: Back to previous home environment Consults called: Neurology and ophthalmology. All the records are reviewed and case discussed with ED provider.  Status is: Inpatient   At the time of the admission, it appears that the appropriate admission status for this patient is inpatient.  This is judged to be reasonable and necessary in order to provide the required intensity of service to ensure the patient's safety given the presenting symptoms, physical exam findings and initial radiographic and laboratory data in the context of comorbid conditions.  The patient requires inpatient status due to high intensity of service, high risk of further deterioration and high  frequency of surveillance required.  I certify that at the time of admission, it is my clinical judgment that the patient will require inpatient hospital care extending more than 2 midnights.                            Dispo: The patient is from: Home              Anticipated d/c is to: Home              Patient currently is not medically stable to d/c.              Difficult to place patient: No   Christel Mormon M.D on 08/18/2021 at 12:16 AM  [facility]   From 7 PM-7 AM, contact night-coverage [email or web address]  CC: Primary care physician; Remi Haggard, FNP

## 2021-08-18 NOTE — ED Notes (Signed)
Called lab to request phlebotomist  

## 2021-08-18 NOTE — Plan of Care (Signed)
  Problem: Education: Goal: Knowledge of General Education information will improve Description: Including pain rating scale, medication(s)/side effects and non-pharmacologic comfort measures Outcome: Progressing   Problem: Coping: Goal: Level of anxiety will decrease Outcome: Progressing   Problem: Activity: Goal: Risk for activity intolerance will decrease Outcome: Progressing   

## 2021-08-19 ENCOUNTER — Inpatient Hospital Stay: Payer: Medicare Other

## 2021-08-19 DIAGNOSIS — R739 Hyperglycemia, unspecified: Secondary | ICD-10-CM

## 2021-08-19 DIAGNOSIS — D751 Secondary polycythemia: Secondary | ICD-10-CM

## 2021-08-19 LAB — COMPREHENSIVE METABOLIC PANEL
ALT: 19 U/L (ref 0–44)
AST: 16 U/L (ref 15–41)
Albumin: 4.2 g/dL (ref 3.5–5.0)
Alkaline Phosphatase: 91 U/L (ref 38–126)
Anion gap: 9 (ref 5–15)
BUN: 21 mg/dL (ref 8–23)
CO2: 25 mmol/L (ref 22–32)
Calcium: 9.7 mg/dL (ref 8.9–10.3)
Chloride: 104 mmol/L (ref 98–111)
Creatinine, Ser: 0.51 mg/dL (ref 0.44–1.00)
GFR, Estimated: >60 mL/min (ref >60)
Glucose, Bld: 199 mg/dL — ABNORMAL HIGH (ref 70–99)
Potassium: 3.8 mmol/L (ref 3.5–5.1)
Sodium: 138 mmol/L (ref 135–145)
Total Bilirubin: 0.5 mg/dL (ref 0.3–1.2)
Total Protein: 7 g/dL (ref 6.5–8.1)

## 2021-08-19 LAB — CBC WITH DIFFERENTIAL/PLATELET
Abs Immature Granulocytes: 0.02 10*3/uL (ref 0.00–0.07)
Basophils Absolute: 0.1 10*3/uL (ref 0.0–0.1)
Basophils Relative: 1 %
Eosinophils Absolute: 0.2 10*3/uL (ref 0.0–0.5)
Eosinophils Relative: 2 %
HCT: 44.6 % (ref 36.0–46.0)
Hemoglobin: 15.2 g/dL — ABNORMAL HIGH (ref 12.0–15.0)
Immature Granulocytes: 0 %
Lymphocytes Relative: 21 %
Lymphs Abs: 1.9 10*3/uL (ref 0.7–4.0)
MCH: 30.5 pg (ref 26.0–34.0)
MCHC: 34.1 g/dL (ref 30.0–36.0)
MCV: 89.6 fL (ref 80.0–100.0)
Monocytes Absolute: 0.7 10*3/uL (ref 0.1–1.0)
Monocytes Relative: 8 %
Neutro Abs: 6.1 10*3/uL (ref 1.7–7.7)
Neutrophils Relative %: 68 %
Platelets: 235 10*3/uL (ref 150–400)
RBC: 4.98 MIL/uL (ref 3.87–5.11)
RDW: 12.9 % (ref 11.5–15.5)
WBC: 8.9 10*3/uL (ref 4.0–10.5)
nRBC: 0 % (ref 0.0–0.2)

## 2021-08-19 LAB — PHOSPHORUS: Phosphorus: 2.6 mg/dL (ref 2.5–4.6)

## 2021-08-19 LAB — MAGNESIUM: Magnesium: 2.1 mg/dL (ref 1.7–2.4)

## 2021-08-19 LAB — SEDIMENTATION RATE: Sed Rate: 6 mm/hr (ref 0–30)

## 2021-08-19 MED ORDER — ALUM & MAG HYDROXIDE-SIMETH 200-200-20 MG/5ML PO SUSP
30.0000 mL | ORAL | Status: DC | PRN
  Administered 2021-08-19: 16:00:00 30 mL via ORAL
  Filled 2021-08-19: qty 30

## 2021-08-19 MED ORDER — DIPHENHYDRAMINE HCL 25 MG PO CAPS
25.0000 mg | ORAL_CAPSULE | Freq: Once | ORAL | Status: AC
  Administered 2021-08-19: 25 mg via ORAL
  Filled 2021-08-19: qty 1

## 2021-08-19 MED ORDER — CALCIUM CARBONATE ANTACID 500 MG PO CHEW
400.0000 mg | CHEWABLE_TABLET | Freq: Two times a day (BID) | ORAL | Status: DC
  Administered 2021-08-19: 16:00:00 400 mg via ORAL
  Filled 2021-08-19: qty 2

## 2021-08-19 MED ORDER — ONDANSETRON HCL 4 MG/2ML IJ SOLN
4.0000 mg | Freq: Four times a day (QID) | INTRAMUSCULAR | Status: DC | PRN
  Filled 2021-08-19: qty 2

## 2021-08-19 MED ORDER — CLOPIDOGREL BISULFATE 75 MG PO TABS
75.0000 mg | ORAL_TABLET | Freq: Every day | ORAL | Status: DC
  Administered 2021-08-19 – 2021-08-21 (×3): 75 mg via ORAL
  Filled 2021-08-19 (×3): qty 1

## 2021-08-19 MED ORDER — IOHEXOL 350 MG/ML SOLN
75.0000 mL | Freq: Once | INTRAVENOUS | Status: AC | PRN
  Administered 2021-08-19: 11:00:00 75 mL via INTRAVENOUS

## 2021-08-19 NOTE — Progress Notes (Signed)
PROGRESS NOTE    [name]  TML:[id number] DOB: 08-08-1960 DOA: 08/17/2021 PCP: Armando Gang, FNP   Brief Narrative: The patient is a 61 year old Caucasian female with a past medical history significant for but not limited to anxiety and depression, diabetes mellitus type 2, history of hypertension, she of TIA coronary artery disease as well as other comorbidities who presented to the emergency room with acute onset of right eye visual loss associate with mild headache without dizziness.  She felt that she had visual field defects over the nasal part of her left eye.  She denies any paresthesias or focal muscle weakness and no facial droop or numbness.  States that she went to her ophthalmologist who then referred her to the ED for further work-up given her visual field loss.  Ophthalmology was concerned for a central retinal artery occlusion.  She had a noncontrast CT scan done which showed mild chronic ischemic white matter changes with no acute intra cranial abnormality.  She is given 3.5 mg of aspirin as well as 650 mg of acetaminophen and admitted to medical telemetry bed.  Ophthalmology and neurology were consulted and she is diagnosed with a right central retinal artery occlusion and sent to ED for assessment of possible CVA and further work-up of this central retinal artery occlusion.  PT OT evaluated and recommending no follow-up and SLP recommended no follow-up so she was placed on a diet.  Neurology evaluated and recommending completing a stroke work-up with a carotid ultrasound, hemoglobin A1c continue cardiac telemetry they feel there is no need to obtain a TTE given that she had a study 2 months ago but they did recommend a TEE with bubble study.  Dr. Otelia Limes also added a CT of the neck to more fully visualize the internal carotid arteries.  She is been placed on aspirin and Plavix and CTA of the neck has been done but her carotid ultrasound has still not been done yet so this will  be ordered.  We will notify cardiology for TEE with bubble study.  Assessment & Plan:   Principal Problem:   Central retinal artery occlusion of right eye  Right central retinal artery occlusion with subsequent right eye nasal hemianopsia -She is admitted to the medical telemetry bed -Continue to monitor neurochecks per protocol -She was placed on aspirin a 81 mg and will defer Plavix addition to neurology -She was initiated on a statin -Neurology was consulted and Dr. Otelia Limes is aware and will make further recommendations -Ophthalmology was consulted and Dr. Inez Pilgrim tingling is denied except recommending stroke work-up with the GCA in the differential -She is undergoing an MRA and MRI of the head and MRA and MRI showed no acute intracranial abnormalities with multiple small vessel infarcts and sequela of chronic small vessel disease and a normal intracranial MRA -We will check an echocardiogram and further work-up per neurology but neurology feels that she does not need a transthoracic echocardiogram anymore and she would benefit rather with a transesophageal echocardiogram with a bubble study - We will reach out to cardiology for TEE with bubble study to be done -Neurology also recommended obtaining a carotid Dopplers as well as a CTA of the neck -CTA of the neck done and showed "No emergent finding. Atherosclerosis without flow limiting stenosis of major vessels. Widely patent right carotid stent. Aortic Atherosclerosis and Emphysema."  -Lipid panel done and showed a total cholesterol/HDL ratio 3.5, cholesterol level 167, HDL 48, LDL of 94, triglycerides 125, VLDL of 25 -Hemoglobin  A1c still pending -We have started aspirin 81 mg daily, clopidogrel 75 mg p.o. daily and patient is on a statin 40 mg p.o. atorvastatin -PT and OT recommending no follow-up and speech is signed off as well -Appreciate further neurology evaluation and care and will allow for permissive hypertension for now;  neurology recommends obtaining CTA of the neck, carotid ultrasound as well as a TEE with bubble study for further work-up -TEE with bubble study being arranged for Monday, 08/20/2021 at 8:30 AM -Neurology recommend smoking cessation counseling as well as benefits of daily light exercise by walking within 30 minutes/day and outpatient ophthalmology follow-up   Essential Hypertension -She takes Norvasc and hydrochlorothiazide as well as Avapro in the outpatient setting which have now been resumed here -Continue monitor blood pressures per protocol  -Last blood pressure reading was 161/67   Anxiety and depression -Continue with home clonazepam 0.5 mg p.o. daily as needed anxiety   Dyslipidemia/hyperlipidemia -Lipid panel as above -Continue with Atorvastatin 40 mg daily   Peripheral Neuropathy -Continue Gabapentin 300 mg p.o. 3 times daily   Hyperglycemia -Patient's blood sugar on admission was 132 and repeat this morning was 207 -Continue monitor blood sugars per protocol and check hemoglobin A1c -If necessary will place on sensitive NovoLog/scale insulin AC  Tobacco abuse -Smoking cessation counseling given -We will initiate the patient on nicotine patch 7 mg transdermally every 24  Indigestion/GERD -Initiate calcium carbonate chewable tablets 400 mg of elemental calcium twice daily with meals  Erythrocytosis -Mild and patient's hemoglobin went from 14.7 is now 15.2 -Likely in the setting of smoking -Continue monitor and trend and repeat CBC in a.m.  DVT prophylaxis: Enoxaparin 40 g subcu every 24 Code Status: FULL CODE Family Communication: No family currently at bedside Disposition Plan: Pending further clinical work-up and clearance by Neurology  Status is: Inpatient  Remains inpatient appropriate because: She continues to get a stroke work-up  Consultants:  Neurology  Procedures:  MRI, MRA, Carotid U/S, TTE with Bubble Study   Antimicrobials:  Anti-infectives  (From admission, onward)    None        Subjective: Seen and examined at bedside and states that she still does not have a very good vision in the right eye and continues to describe it is gray.  States that her vision is slightly better though today than it was yesterday but still very blurry.  No nausea or vomiting.  Denies any chest pain or shortness breath.  Hopeful to go home but I spoke with her that further work-up needed to be done.  No other concerns or complaints this time.  Objective: Vitals:   08/18/21 2344 08/19/21 0609 08/19/21 0852 08/19/21 1252  BP: (!) 140/58 (!) 142/66 (!) 158/66 (!) 161/67  Pulse: (!) 51 70 69 77  Resp: 16 16 18 18   Temp: 97.6 F (36.4 C) 97.7 F (36.5 C) (!) 97.5 F (36.4 C) 98.2 F (36.8 C)  TempSrc: Oral Oral Oral Oral  SpO2: 95% 97% 97% 95%  Weight:      Height:        Intake/Output Summary (Last 24 hours) at 08/19/2021 1521 Last data filed at 08/19/2021 1300 Gross per 24 hour  Intake 720 ml  Output --  Net 720 ml   Filed Weights   08/17/21 1620 08/18/21 1331  Weight: 57.6 kg 57.3 kg   Examination: Physical Exam:  Constitutional: Thin Caucasian female currently in no acute distress appears a little anxious Eyes: Lids and conjunctivae normal, sclerae  anicteric; continues to have a visual field deficit on the right ENMT: External Ears, Nose appear normal. Grossly normal hearing. Mucous membranes are moist. Neck: Appears normal, supple, no cervical masses, normal ROM, no appreciable thyromegaly; no appreciable JVD Respiratory: Diminished to auscultation bilaterally with coarse breath sounds, no wheezing, rales, rhonchi or crackles. Normal respiratory effort and patient is not tachypenic. No accessory muscle use.  Unlabored breathing Cardiovascular: RRR, no murmurs / rubs / gallops. S1 and S2 auscultated.  No appreciable extremity edema Abdomen: Soft, non-tender, non-distended. Bowel sounds positive.  GU: Deferred. Musculoskeletal: No  clubbing / cyanosis of digits/nails. No joint deformity upper and lower extremities. Skin: No rashes, lesions, ulcers on a limited skin evaluation. No induration; Warm and dry.  Neurologic: CN 2-12 grossly intact with no focal deficits. Romberg sign and cerebellar reflexes not assessed.  Psychiatric: Normal judgment and insight. Alert and oriented x 3. Mildly anxious mood and appropriate affect.   Data Reviewed: I have personally reviewed following labs and imaging studies  CBC: Recent Labs  Lab 08/17/21 1619 08/18/21 1330 08/19/21 0538  WBC 10.3 7.9 8.9  NEUTROABS 5.7 4.3 6.1  HGB 15.9* 14.7 15.2*  HCT 48.0* 43.5 44.6  MCV 91.1 89.9 89.6  PLT 242 220 235   Basic Metabolic Panel: Recent Labs  Lab 08/17/21 1619 08/18/21 1330 08/19/21 0538  NA 143 137 138  K 3.5 3.8 3.8  CL 103 101 104  CO2 27 29 25   GLUCOSE 132* 207* 199*  BUN 17 20 21   CREATININE 0.64 0.61 0.51  CALCIUM 10.1 9.5 9.7  MG  --  2.0 2.1  PHOS  --  2.9 2.6   GFR: Estimated Creatinine Clearance: 57.8 mL/min (by C-G formula based on SCr of 0.51 mg/dL). Liver Function Tests: Recent Labs  Lab 08/17/21 1619 08/18/21 1330 08/19/21 0538  AST 22 20 16   ALT 25 22 19   ALKPHOS 98 93 91  BILITOT 0.5 0.5 0.5  PROT 8.3* 7.5 7.0  ALBUMIN 4.7 4.2 4.2   No results for input(s): LIPASE, AMYLASE in the last 168 hours. No results for input(s): AMMONIA in the last 168 hours. Coagulation Profile: Recent Labs  Lab 08/17/21 1619  INR 1.0   Cardiac Enzymes: No results for input(s): CKTOTAL, CKMB, CKMBINDEX, TROPONINI in the last 168 hours. BNP (last 3 results) No results for input(s): PROBNP in the last 8760 hours. HbA1C: No results for input(s): HGBA1C in the last 72 hours. CBG: No results for input(s): GLUCAP in the last 168 hours. Lipid Profile: Recent Labs    08/18/21 0731  CHOL 167  HDL 48  LDLCALC 94  TRIG 125  CHOLHDL 3.5   Thyroid Function Tests: No results for input(s): TSH, T4TOTAL, FREET4,  T3FREE, THYROIDAB in the last 72 hours. Anemia Panel: No results for input(s): VITAMINB12, FOLATE, FERRITIN, TIBC, IRON, RETICCTPCT in the last 72 hours. Sepsis Labs: No results for input(s): PROCALCITON, LATICACIDVEN in the last 168 hours.  Recent Results (from the past 240 hour(s))  Resp Panel by RT-PCR (Flu A&B, Covid) Nasopharyngeal Swab     Status: None   Collection Time: 08/17/21 11:44 PM   Specimen: Nasopharyngeal Swab; Nasopharyngeal(NP) swabs in vial transport medium  Result Value Ref Range Status   SARS Coronavirus 2 by RT PCR NEGATIVE NEGATIVE Final    Comment: (NOTE) SARS-CoV-2 target nucleic acids are NOT DETECTED.  The SARS-CoV-2 RNA is generally detectable in upper respiratory specimens during the acute phase of infection. The lowest concentration of SARS-CoV-2 viral copies  this assay can detect is 138 copies/mL. A negative result does not preclude SARS-Cov-2 infection and should not be used as the sole basis for treatment or other patient management decisions. A negative result may occur with  improper specimen collection/handling, submission of specimen other than nasopharyngeal swab, presence of viral mutation(s) within the areas targeted by this assay, and inadequate number of viral copies(<138 copies/mL). A negative result must be combined with clinical observations, patient history, and epidemiological information. The expected result is Negative.  Fact Sheet for Patients:  BloggerCourse.com  Fact Sheet for Healthcare Providers:  SeriousBroker.it  This test is no t yet approved or cleared by the [place] FDA and  has been authorized for detection and/or diagnosis of SARS-CoV-2 by FDA under an Emergency Use Authorization (EUA). This EUA will remain  in effect (meaning this test can be used) for the duration of the COVID-19 declaration under Section 564(b)(1) of the Act, 21 U.S.C.section 360bbb-3(b)(1),  unless the authorization is terminated  or revoked sooner.       Influenza A by PCR NEGATIVE NEGATIVE Final   Influenza B by PCR NEGATIVE NEGATIVE Final    Comment: (NOTE) The Xpert Xpress SARS-CoV-2/FLU/RSV plus assay is intended as an aid in the diagnosis of influenza from Nasopharyngeal swab specimens and should not be used as a sole basis for treatment. Nasal washings and aspirates are unacceptable for Xpert Xpress SARS-CoV-2/FLU/RSV testing.  Fact Sheet for Patients: BloggerCourse.com  Fact Sheet for Healthcare Providers: SeriousBroker.it  This test is not yet approved or cleared by the [place] FDA and has been authorized for detection and/or diagnosis of SARS-CoV-2 by FDA under an Emergency Use Authorization (EUA). This EUA will remain in effect (meaning this test can be used) for the duration of the COVID-19 declaration under Section 564(b)(1) of the Act, 21 U.S.C. section 360bbb-3(b)(1), unless the authorization is terminated or revoked.  Performed at [facility], [address] [place], [place] [address]     RN Pressure Injury Documentation:     Estimated body mass index is 24.67 kg/m as calculated from the following:   Height as of this encounter: 5' (1.524 m).   Weight as of this encounter: 57.3 kg.  Malnutrition Type:   Malnutrition Characteristics:   Nutrition Interventions:   Radiology Studies: CT HEAD WO CONTRAST  Result Date: 08/17/2021 CLINICAL DATA:  Acute neuro deficit.  Stroke suspected. EXAM: CT HEAD WITHOUT CONTRAST TECHNIQUE: Contiguous axial images were obtained from the base of the skull through the vertex without intravenous contrast. RADIATION DOSE REDUCTION: This exam was performed according to the departmental dose-optimization program which includes automated exposure control, adjustment of the mA and/or kV according to patient size and/or use of iterative reconstruction  technique. COMPARISON:  CT brain 05/25/2013 FINDINGS: Brain: The ventricles are normal in size and configuration. The basilar cisterns are patent. No mass, mass effect, or midline shift. No acute intracranial hemorrhage is seen. No abnormal extra-axial fluid collection. Moderate periventricular and subcortical white matter patchy hypodensities are similar to prior, nonspecific but most likely secondary to chronic ischemic white matter changes. Well-circumscribed small old lacunar infarct within the inferior right cerebellar hemisphere (axial image 7, coronal image 43). Preservation of the normal cortical gray-white interface without CT evidence of an acute major vascular territorial cortical based infarction. Vascular: There are intracranial atherosclerotic calcifications. Skull: Normal. Negative for fracture or focal lesion. Sinuses/Orbits: The visualized orbits are unremarkable. The visualized paranasal sinuses and mastoid air cells are clear. Other: None. IMPRESSION:: IMPRESSION:  1. No acute intracranial process. 2. Moderate chronic ischemic white matter changes, similar to prior. Electronically Signed   By: Neita Garnet M.D.   On: 08/17/2021 17:26   CT ANGIO NECK W OR WO CONTRAST  Result Date: 08/19/2021 CLINICAL DATA:  Carotid artery dissection suspected EXAM: CT ANGIOGRAPHY NECK TECHNIQUE: Multidetector CT imaging of the neck was performed using the standard protocol during bolus administration of intravenous contrast. Multiplanar CT image reconstructions and MIPs were obtained to evaluate the vascular anatomy. Carotid stenosis measurements (when applicable) are obtained utilizing NASCET criteria, using the distal internal carotid diameter as the denominator. RADIATION DOSE REDUCTION: This exam was performed according to the departmental dose-optimization program which includes automated exposure control, adjustment of the mA and/or kV according to patient size and/or use of iterative reconstruction  technique. CONTRAST:  75mL OMNIPAQUE IOHEXOL 350 MG/ML SOLN COMPARISON:  01/10/2020 CTA.  Brain MRI from yesterday FINDINGS: Aortic arch: Extensive atheromatous plaque. Three vessel branching. No visible dilatation Right carotid system: Atheromatous wall thickening of the distal common carotid and proximal ICA. Right carotid stenting centered at the bifurcation. No in stent stenosis or adjacent dissection. Left carotid system: Diffuse atheromatous wall thickening of the common carotid and proximal ICA. No flow limiting stenosis or ulceration. Vertebral arteries: Proximal subclavian atherosclerosis without flow limiting stenosis. Mild dominance of the left vertebral artery. No vertebral stenosis or beading Skeleton: C4-5 and C5-6 ACDF. C4-5 solid arthrodesis. No definite solid arthrodesis at C5-6, but also no hardware failure. Other neck: No acute finding Upper chest: Airway thickening and emphysema. IMPRESSION: 1. No emergent finding. 2. Atherosclerosis without flow limiting stenosis of major vessels. Widely patent right carotid stent. 3. Aortic Atherosclerosis (ICD10-I70.0) and Emphysema (ICD10-J43.9). Electronically Signed   By: Tiburcio Pea M.D.   On: 08/19/2021 11:30   MR ANGIO HEAD WO CONTRAST  Result Date: 08/18/2021 CLINICAL DATA:  Acute neurologic deficit.  Altered right eye vision. EXAM: MRI HEAD WITHOUT CONTRAST MRA HEAD WITHOUT CONTRAST TECHNIQUE: Multiplanar, multi-echo pulse sequences of the brain and surrounding structures were acquired without intravenous contrast. Angiographic images of the Circle of Willis were acquired using MRA technique without intravenous contrast. COMPARISON:  No pertinent prior exam. FINDINGS: MRI HEAD FINDINGS Brain: No acute infarct, mass effect or extra-axial collection. Right parietal focus of chronic microhemorrhage. Old right cerebellar infarct. Multiple old basal ganglia small vessel infarcts. Hyperintense T2-weighted signal is moderately widespread throughout  the white matter. Generalized volume loss without a clear lobar predilection. The midline structures are normal. Vascular: Major flow voids are preserved. Skull and upper cervical spine: Normal calvarium and skull base. Visualized upper cervical spine and soft tissues are normal. Sinuses/Orbits:No paranasal sinus fluid levels or advanced mucosal thickening. No mastoid or middle ear effusion. Normal orbits. MRA HEAD FINDINGS POSTERIOR CIRCULATION: --Vertebral arteries: Normal --Inferior cerebellar arteries: Normal. --Basilar artery: Normal. --Superior cerebellar arteries: Normal. --Posterior cerebral arteries: Normal. ANTERIOR CIRCULATION: --Intracranial internal carotid arteries: Normal. --Anterior cerebral arteries (ACA): Normal. --Middle cerebral arteries (MCA): Normal. ANATOMIC VARIANTS: None IMPRESSION: 1. No acute intracranial abnormality. 2. Multiple old small vessel infarcts and sequelae of chronic small vessel disease. 3. Normal intracranial MRA. Electronically Signed   By: Deatra Robinson M.D.   On: 08/18/2021 02:55   MR BRAIN WO CONTRAST  Result Date: 08/18/2021 CLINICAL DATA:  Acute neurologic deficit.  Altered right eye vision. EXAM: MRI HEAD WITHOUT CONTRAST MRA HEAD WITHOUT CONTRAST TECHNIQUE: Multiplanar, multi-echo pulse sequences of the brain and surrounding structures were acquired without intravenous contrast. Angiographic images of  the Circle of Willis were acquired using MRA technique without intravenous contrast. COMPARISON:  No pertinent prior exam. FINDINGS: MRI HEAD FINDINGS Brain: No acute infarct, mass effect or extra-axial collection. Right parietal focus of chronic microhemorrhage. Old right cerebellar infarct. Multiple old basal ganglia small vessel infarcts. Hyperintense T2-weighted signal is moderately widespread throughout the white matter. Generalized volume loss without a clear lobar predilection. The midline structures are normal. Vascular: Major flow voids are preserved. Skull  and upper cervical spine: Normal calvarium and skull base. Visualized upper cervical spine and soft tissues are normal. Sinuses/Orbits:No paranasal sinus fluid levels or advanced mucosal thickening. No mastoid or middle ear effusion. Normal orbits. MRA HEAD FINDINGS POSTERIOR CIRCULATION: --Vertebral arteries: Normal --Inferior cerebellar arteries: Normal. --Basilar artery: Normal. --Superior cerebellar arteries: Normal. --Posterior cerebral arteries: Normal. ANTERIOR CIRCULATION: --Intracranial internal carotid arteries: Normal. --Anterior cerebral arteries (ACA): Normal. --Middle cerebral arteries (MCA): Normal. ANATOMIC VARIANTS: None IMPRESSION: 1. No acute intracranial abnormality. 2. Multiple old small vessel infarcts and sequelae of chronic small vessel disease. 3. Normal intracranial MRA. Electronically Signed   By: Deatra Robinson M.D.   On: 08/18/2021 02:55    Scheduled Meds:   stroke: mapping our early stages of recovery book   Does not apply Once   amLODipine  10 mg Oral Daily   aspirin EC  81 mg Oral Daily   atorvastatin  40 mg Oral Daily   enoxaparin (LOVENOX) injection  40 mg Subcutaneous Q24H   hydrochlorothiazide  25 mg Oral Daily   irbesartan  150 mg Oral Daily   melatonin  2.5 mg Oral QHS   nicotine  7 mg Transdermal Daily   Continuous Infusions:  sodium chloride 100 mL/hr at 08/19/21 1610    LOS: 2 days   Merlene Laughter, DO [facility] PAGER is on AMION  If 7PM-7AM, please contact night-coverage [email or web address]

## 2021-08-20 DIAGNOSIS — E1169 Type 2 diabetes mellitus with other specified complication: Secondary | ICD-10-CM

## 2021-08-20 DIAGNOSIS — Z720 Tobacco use: Secondary | ICD-10-CM

## 2021-08-20 LAB — CBC WITH DIFFERENTIAL/PLATELET
Abs Immature Granulocytes: 0.02 10*3/uL (ref 0.00–0.07)
Basophils Absolute: 0.1 10*3/uL (ref 0.0–0.1)
Basophils Relative: 1 %
Eosinophils Absolute: 0.1 10*3/uL (ref 0.0–0.5)
Eosinophils Relative: 2 %
HCT: 41.9 % (ref 36.0–46.0)
Hemoglobin: 13.7 g/dL (ref 12.0–15.0)
Immature Granulocytes: 0 %
Lymphocytes Relative: 35 %
Lymphs Abs: 2.2 10*3/uL (ref 0.7–4.0)
MCH: 29.6 pg (ref 26.0–34.0)
MCHC: 32.7 g/dL (ref 30.0–36.0)
MCV: 90.5 fL (ref 80.0–100.0)
Monocytes Absolute: 0.6 10*3/uL (ref 0.1–1.0)
Monocytes Relative: 10 %
Neutro Abs: 3.2 10*3/uL (ref 1.7–7.7)
Neutrophils Relative %: 52 %
Platelets: 179 10*3/uL (ref 150–400)
RBC: 4.63 MIL/uL (ref 3.87–5.11)
RDW: 13 % (ref 11.5–15.5)
WBC: 6.2 10*3/uL (ref 4.0–10.5)
nRBC: 0 % (ref 0.0–0.2)

## 2021-08-20 LAB — COMPREHENSIVE METABOLIC PANEL
ALT: 17 U/L (ref 0–44)
AST: 15 U/L (ref 15–41)
Albumin: 3.6 g/dL (ref 3.5–5.0)
Alkaline Phosphatase: 79 U/L (ref 38–126)
Anion gap: 7 (ref 5–15)
BUN: 23 mg/dL (ref 8–23)
CO2: 26 mmol/L (ref 22–32)
Calcium: 9.4 mg/dL (ref 8.9–10.3)
Chloride: 105 mmol/L (ref 98–111)
Creatinine, Ser: 0.5 mg/dL (ref 0.44–1.00)
GFR, Estimated: >60 mL/min (ref >60)
Glucose, Bld: 150 mg/dL — ABNORMAL HIGH (ref 70–99)
Potassium: 3.8 mmol/L (ref 3.5–5.1)
Sodium: 138 mmol/L (ref 135–145)
Total Bilirubin: 0.5 mg/dL (ref 0.3–1.2)
Total Protein: 6.7 g/dL (ref 6.5–8.1)

## 2021-08-20 LAB — PHOSPHORUS: Phosphorus: 3.4 mg/dL (ref 2.5–4.6)

## 2021-08-20 LAB — MAGNESIUM: Magnesium: 1.7 mg/dL (ref 1.7–2.4)

## 2021-08-20 LAB — HEMOGLOBIN A1C
Hgb A1c MFr Bld: 7.1 % — ABNORMAL HIGH (ref 4.8–5.6)
Hgb A1c MFr Bld: 7.1 % — ABNORMAL HIGH (ref 4.8–5.6)
Mean Plasma Glucose: 157 mg/dL
Mean Plasma Glucose: 157 mg/dL

## 2021-08-20 NOTE — Plan of Care (Signed)
Please see Dr. Shelbie Hutching note from 1/28 for full recommendations.   Data review:  MRI brain:  1. No acute intracranial abnormality. 2. Multiple old small vessel infarcts and sequelae of chronic small vessel disease. 3. Normal intracranial MRA.  Carotid dopplers:  1. Right carotid artery system: 50-69% focal stenosis in the mid internal carotid artery likely secondary to atherosclerotic plaque formation. The indwelling common carotid artery stent appears patent.   2. Left carotid artery system: Less than 50% stenosis secondary to mild multifocal atherosclerotic plaque formation.   3.  Vertebral artery system: Patent with antegrade flow bilaterally.  CTA neck  1. No emergent finding. 2. Atherosclerosis without flow limiting stenosis of major vessels. Widely patent right carotid stent. 3. Aortic Atherosclerosis (ICD10-I70.0) and Emphysema (ICD10-J43.9).  MRA head  1. No acute intracranial abnormality. 2. Multiple old small vessel infarcts and sequelae of chronic small vessel disease. 3. Normal intracranial MRA.  Recommendations: - Goal normotension - Continue ASA, plavix, statin - Patient is awaiting TEE, will make final recommendations tomorrow after those results are available.  Bing Neighbors, MD [facility] [phone]  If 7pm- 7am, please page neurology on call as listed in AMION.

## 2021-08-20 NOTE — Progress Notes (Signed)
° ° °  [facility] has been requested to perform a transesophageal echocardiogram with agitated saline contrast on [name] for CRAO.  After careful review of history and examination, the risks and benefits of transesophageal echocardiogram have been explained including risks of esophageal damage, perforation (1:10,000 risk), bleeding, pharyngeal hematoma as well as other potential complications associated with anesthesia including aspiration, arrhythmia, respiratory failure and death. Alternatives to treatment were discussed, questions were answered. Patient is willing to proceed.   2D echo 06/12/2021: 1. Left ventricular ejection fraction, by estimation, is 60 to 65%. The  left ventricle has normal function. The left ventricle has no regional  wall motion abnormalities. Left ventricular diastolic parameters are  consistent with Grade II diastolic  dysfunction (pseudonormalization). The average left ventricular global  longitudinal strain is -16.2 %. The global longitudinal strain is normal.   2. Right ventricular systolic function is normal. The right ventricular  size is normal. Tricuspid regurgitation signal is inadequate for assessing  PA pressure.   3. The mitral valve is normal in structure. No evidence of mitral valve  regurgitation. No evidence of mitral stenosis.   4. The aortic valve is normal in structure. Aortic valve regurgitation is  not visualized. No aortic stenosis is present.   5. The inferior vena cava is normal in size with greater than 50%  respiratory variability, suggesting right atrial pressure of 3 mmHg.    Patient will be made n.p.o. at midnight.  From our perspective, patient may eat on 1/30.  Plan for TEE on 08/21/2021.   Eula Listen, PA-C 08/20/2021 1:14 PM

## 2021-08-20 NOTE — Clinical Social Work Note (Signed)
°  Transition of Care [facility]) Screening Note   Patient Details  Name: [name] Date of Birth: March 20, 1961   Transition of Care [facility]) CM/SW Contact:    Maree Krabbe, LCSW Phone Number:[phone] 08/20/2021, 10:56 AM    Transition of Care Department [facility]) has reviewed patient and no TOC needs have been identified at this time. We will continue to monitor patient advancement through interdisciplinary progression rounds. If new patient transition needs arise, please place a TOC consult.

## 2021-08-20 NOTE — Progress Notes (Signed)
PROGRESS NOTE    [name]  MPN:[id number] DOB: April 12, 1961 DOA: 08/17/2021 PCP: Armando Gang, FNP   Brief Narrative: The patient is a 61 year old Caucasian female with a past medical history significant for but not limited to anxiety and depression, diabetes mellitus type 2, history of hypertension, she of TIA coronary artery disease as well as other comorbidities who presented to the emergency room with acute onset of right eye visual loss associate with mild headache without dizziness.  She felt that she had visual field defects over the nasal part of her left eye.  She denies any paresthesias or focal muscle weakness and no facial droop or numbness.  States that she went to her ophthalmologist who then referred her to the ED for further work-up given her visual field loss.  Ophthalmology was concerned for a central retinal artery occlusion.  She had a noncontrast CT scan done which showed mild chronic ischemic white matter changes with no acute intra cranial abnormality.  She is given 3.5 mg of aspirin as well as 650 mg of acetaminophen and admitted to medical telemetry bed.  Ophthalmology and neurology were consulted and she is diagnosed with a right central retinal artery occlusion and sent to ED for assessment of possible CVA and further work-up of this central retinal artery occlusion.  PT OT evaluated and recommending no follow-up and SLP recommended no follow-up so she was placed on a diet.  Neurology evaluated and recommending completing a stroke work-up with a carotid ultrasound, hemoglobin A1c continue cardiac telemetry they feel there is no need to obtain a TTE given that she had a study 2 months ago but they did recommend a TEE with bubble study.  Dr. Otelia Limes also added a CT of the neck to more fully visualize the internal carotid arteries.  She is been placed on aspirin and Plavix and CTA of the neck has been done but her carotid ultrasound has still not been done yet so this will  be ordered.  We will notify cardiology for TEE with bubble study and plan is for TEE with bubble study on 08/21/2021.  Neurology recommends continuing aspirin, Plavix and statin make final recommendations tomorrow after TEE results are obtained  Assessment & Plan:   Principal Problem:   Central retinal artery occlusion of right eye  Right central retinal artery occlusion with subsequent right eye nasal hemianopsia -She is admitted to the medical telemetry bed -Continue to monitor neurochecks per protocol -She was placed on aspirin a 81 mg and will defer Plavix addition to neurology -She was initiated on a statin -Neurology was consulted and Dr. Otelia Limes is aware and will make further recommendations -Ophthalmology was consulted and Dr. Inez Pilgrim tingling is denied except recommending stroke work-up with the GCA in the differential -She is undergoing an MRA and MRI of the head and MRA and MRI showed no acute intracranial abnormalities with multiple small vessel infarcts and sequela of chronic small vessel disease and a normal intracranial MRA -We will check an echocardiogram and further work-up per neurology but neurology feels that she does not need a transthoracic echocardiogram anymore and she would benefit rather with a transesophageal echocardiogram with a bubble study - We will reach out to cardiology for TEE with bubble study to be done -CTA of the neck done and showed "No emergent finding. Atherosclerosis without flow limiting stenosis of major vessels. Widely patent right carotid stent. Aortic Atherosclerosis and Emphysema."  -Carotid Dopplers showed "Right carotid artery system: 50-69% focal stenosis in the  mid internal carotid artery likely secondary to atherosclerotic plaque formation. The indwelling common carotid artery stent appears patent. Left carotid artery system: Less than 50% stenosis secondary to mild multifocal atherosclerotic plaque formation. Vertebral artery system: Patent  with antegrade flow bilaterally." -Lipid panel done and showed a total cholesterol/HDL ratio 3.5, cholesterol level 167, HDL 48, LDL of 94, triglycerides 125, VLDL of 25 -Hemoglobin A1c was 7.1 -We have started aspirin 81 mg daily, clopidogrel 75 mg p.o. daily and patient is on a statin 40 mg p.o. atorvastatin -PT and OT recommending no follow-up and speech is signed off as well -Appreciate further neurology evaluation and care and will allow for permissive hypertension for now; neurology recommends obtaining CTA of the neck, carotid ultrasound as well as a TEE with bubble study for further work-up -TEE with bubble study being arranged for 08/21/2021 and pending  -Neurology recommend smoking cessation counseling as well as benefits of daily light exercise by walking within 30 minutes/day and outpatient ophthalmology follow-up   Essential Hypertension -She takes Norvasc and hydrochlorothiazide as well as Avapro in the outpatient setting which have now been resumed here -Continue monitor blood pressures per protocol  -Last blood pressure reading was 160/66   Anxiety and depression -Continue with home clonazepam 0.5 mg p.o. daily as needed anxiety   Dyslipidemia/hyperlipidemia -Lipid panel as above -Continue with Atorvastatin 40 mg daily   Peripheral Neuropathy -Continue Gabapentin 300 mg p.o. 3 times daily   Hyperglycemia in the setting of New onset Diabetes Mellitus Type 2 -Patient's blood sugar on admission was 132 and now ranging from 132-207 -Continue monitor blood sugars per protocol  -HbA1c was 7.1 -If necessary will place on sensitive NovoLog/scale insulin AC  Tobacco abuse -Smoking cessation counseling given -We will initiate the patient on nicotine patch 7 mg transdermally every 24  Indigestion/GERD -Initiate calcium carbonate chewable tablets 400 mg of elemental calcium twice daily with meals  Erythrocytosis, improved -Mild and patient's hemoglobin went from 14.7 -> 15.2;  Now Hgb/Hct is 13.7/41.9 -Likely in the setting of smoking -Continue monitor and trend and repeat CBC in a.m.  DVT prophylaxis: Enoxaparin 40 g subcu every 24 Code Status: FULL CODE Family Communication: No family currently at bedside Disposition Plan: Pending further clinical work-up and clearance by Neurology and TEE by Cardiology  Status is: Inpatient  Remains inpatient appropriate because: She continues to get a stroke work-up  Consultants:  Neurology Cardiology  Procedures:  MRI, MRA, Carotid U/S, TTE with Bubble Study   Antimicrobials:  Anti-infectives (From admission, onward)    None        Subjective: Seen and examined at bedside and she is doing okay.  Still does not have very good vision from the right eye.  No nausea or vomiting.  Denies any lightheadedness or dizziness.  No other concerns or close at this time and awaiting TEE and this will be done tomorrow morning.  No other concerns or complaints at this time.  Objective: Vitals:   08/20/21 0113 08/20/21 0456 08/20/21 0846 08/20/21 1208  BP: (!) 160/59 (!) 150/88 (!) 161/61 (!) 160/66  Pulse:  66 (!) 58 (!) 59  Resp:  16 15 16   Temp:  97.8 F (36.6 C) 97.6 F (36.4 C) 98 F (36.7 C)  TempSrc:  Oral Oral Oral  SpO2:  94% 95% 96%  Weight:      Height:       No intake or output data in the 24 hours ending 08/20/21 1344  Filed Weights  08/17/21 1620 08/18/21 1331  Weight: 57.6 kg 57.3 kg   Examination: Physical Exam:  Constitutional: Thin Caucasian female currently in no acute distress appears comfortable Eyes: Lids and conjunctive are normal.  Continues to have visual field defect on the right ENMT: External Ears, Nose appear normal. Grossly normal hearing. Mucous membranes are moist.  Neck: Appears normal, supple, no cervical masses, normal ROM, no appreciable thyromegaly; no appreciable JVD Respiratory: Diminished to auscultation bilaterally with coarse breath sounds, no wheezing, rales,  rhonchi or crackles. Normal respiratory effort and patient is not tachypenic. No accessory muscle use.  Unlabored breathing Cardiovascular: RRR, no murmurs / rubs / gallops. S1 and S2 auscultated. No extremity edema.  Abdomen: Soft, non-tender, non-distended. Bowel sounds positive.  GU: Deferred. Musculoskeletal: No clubbing / cyanosis of digits/nails. No joint deformity upper and lower extremities. Skin: No rashes, lesions, ulcers on limited skin evaluation. No induration; Warm and dry.  Neurologic: CN 2-12 grossly intact which she continues to have a visual field deficit on the right.  Romberg sign and cerebellar reflexes not assessed.  Psychiatric: Normal judgment and insight. Alert and oriented x 3. Normal mood and appropriate affect.   Data Reviewed: I have personally reviewed following labs and imaging studies  CBC: Recent Labs  Lab 08/17/21 1619 08/18/21 1330 08/19/21 0538 08/20/21 0642  WBC 10.3 7.9 8.9 6.2  NEUTROABS 5.7 4.3 6.1 3.2  HGB 15.9* 14.7 15.2* 13.7  HCT 48.0* 43.5 44.6 41.9  MCV 91.1 89.9 89.6 90.5  PLT 242 220 235 179    Basic Metabolic Panel: Recent Labs  Lab 08/17/21 1619 08/18/21 1330 08/19/21 0538 08/20/21 0642  NA 143 137 138 138  K 3.5 3.8 3.8 3.8  CL 103 101 104 105  CO2 27 29 25 26   GLUCOSE 132* 207* 199* 150*  BUN 17 20 21 23   CREATININE 0.64 0.61 0.51 0.50  CALCIUM 10.1 9.5 9.7 9.4  MG  --  2.0 2.1 1.7  PHOS  --  2.9 2.6 3.4    GFR: Estimated Creatinine Clearance: 57.8 mL/min (by C-G formula based on SCr of 0.5 mg/dL). Liver Function Tests: Recent Labs  Lab 08/17/21 1619 08/18/21 1330 08/19/21 0538 08/20/21 0642  AST 22 20 16 15   ALT 25 22 19 17   ALKPHOS 98 93 91 79  BILITOT 0.5 0.5 0.5 0.5  PROT 8.3* 7.5 7.0 6.7  ALBUMIN 4.7 4.2 4.2 3.6    No results for input(s): LIPASE, AMYLASE in the last 168 hours. No results for input(s): AMMONIA in the last 168 hours. Coagulation Profile: Recent Labs  Lab 08/17/21 1619  INR 1.0     Cardiac Enzymes: No results for input(s): CKTOTAL, CKMB, CKMBINDEX, TROPONINI in the last 168 hours. BNP (last 3 results) No results for input(s): PROBNP in the last 8760 hours. HbA1C: Recent Labs    08/18/21 0731 08/19/21 1539  HGBA1C 7.1* 7.1*   CBG: No results for input(s): GLUCAP in the last 168 hours. Lipid Profile: Recent Labs    08/18/21 0731  CHOL 167  HDL 48  LDLCALC 94  TRIG 125  CHOLHDL 3.5    Thyroid Function Tests: No results for input(s): TSH, T4TOTAL, FREET4, T3FREE, THYROIDAB in the last 72 hours. Anemia Panel: No results for input(s): VITAMINB12, FOLATE, FERRITIN, TIBC, IRON, RETICCTPCT in the last 72 hours. Sepsis Labs: No results for input(s): PROCALCITON, LATICACIDVEN in the last 168 hours.  Recent Results (from the past 240 hour(s))  Resp Panel by RT-PCR (Flu A&B, Covid) Nasopharyngeal Swab  Status: None   Collection Time: 08/17/21 11:44 PM   Specimen: Nasopharyngeal Swab; Nasopharyngeal(NP) swabs in vial transport medium  Result Value Ref Range Status   SARS Coronavirus 2 by RT PCR NEGATIVE NEGATIVE Final    Comment: (NOTE) SARS-CoV-2 target nucleic acids are NOT DETECTED.  The SARS-CoV-2 RNA is generally detectable in upper respiratory specimens during the acute phase of infection. The lowest concentration of SARS-CoV-2 viral copies this assay can detect is 138 copies/mL. A negative result does not preclude SARS-Cov-2 infection and should not be used as the sole basis for treatment or other patient management decisions. A negative result may occur with  improper specimen collection/handling, submission of specimen other than nasopharyngeal swab, presence of viral mutation(s) within the areas targeted by this assay, and inadequate number of viral copies(<138 copies/mL). A negative result must be combined with clinical observations, patient history, and epidemiological information. The expected result is Negative.  Fact Sheet for  Patients:  BloggerCourse.comhttps://www.fda.gov/media/152166/download  Fact Sheet for Healthcare Providers:  SeriousBroker.ithttps://www.fda.gov/media/152162/download  This test is no t yet approved or cleared by the [place] FDA and  has been authorized for detection and/or diagnosis of SARS-CoV-2 by FDA under an Emergency Use Authorization (EUA). This EUA will remain  in effect (meaning this test can be used) for the duration of the COVID-19 declaration under Section 564(b)(1) of the Act, 21 U.S.C.section 360bbb-3(b)(1), unless the authorization is terminated  or revoked sooner.       Influenza A by PCR NEGATIVE NEGATIVE Final   Influenza B by PCR NEGATIVE NEGATIVE Final    Comment: (NOTE) The Xpert Xpress SARS-CoV-2/FLU/RSV plus assay is intended as an aid in the diagnosis of influenza from Nasopharyngeal swab specimens and should not be used as a sole basis for treatment. Nasal washings and aspirates are unacceptable for Xpert Xpress SARS-CoV-2/FLU/RSV testing.  Fact Sheet for Patients: BloggerCourse.comhttps://www.fda.gov/media/152166/download  Fact Sheet for Healthcare Providers: SeriousBroker.ithttps://www.fda.gov/media/152162/download  This test is not yet approved or cleared by the [place] FDA and has been authorized for detection and/or diagnosis of SARS-CoV-2 by FDA under an Emergency Use Authorization (EUA). This EUA will remain in effect (meaning this test can be used) for the duration of the COVID-19 declaration under Section 564(b)(1) of the Act, 21 U.S.C. section 360bbb-3(b)(1), unless the authorization is terminated or revoked.  Performed at [facility], [address] [place], [place] [id number]      RN Pressure Injury Documentation:     Estimated body mass index is 24.67 kg/m as calculated from the following:   Height as of this encounter: 5' (1.524 m).   Weight as of this encounter: 57.3 kg.  Malnutrition Type:   Malnutrition Characteristics:   Nutrition Interventions:   Radiology  Studies: CT ANGIO NECK W OR WO CONTRAST  Result Date: 08/19/2021 CLINICAL DATA:  Carotid artery dissection suspected EXAM: CT ANGIOGRAPHY NECK TECHNIQUE: Multidetector CT imaging of the neck was performed using the standard protocol during bolus administration of intravenous contrast. Multiplanar CT image reconstructions and MIPs were obtained to evaluate the vascular anatomy. Carotid stenosis measurements (when applicable) are obtained utilizing NASCET criteria, using the distal internal carotid diameter as the denominator. RADIATION DOSE REDUCTION: This exam was performed according to the departmental dose-optimization program which includes automated exposure control, adjustment of the mA and/or kV according to patient size and/or use of iterative reconstruction technique. CONTRAST:  75mL OMNIPAQUE IOHEXOL 350 MG/ML SOLN COMPARISON:  01/10/2020 CTA.  Brain MRI from yesterday FINDINGS: Aortic arch: Extensive atheromatous plaque. Three  vessel branching. No visible dilatation Right carotid system: Atheromatous wall thickening of the distal common carotid and proximal ICA. Right carotid stenting centered at the bifurcation. No in stent stenosis or adjacent dissection. Left carotid system: Diffuse atheromatous wall thickening of the common carotid and proximal ICA. No flow limiting stenosis or ulceration. Vertebral arteries: Proximal subclavian atherosclerosis without flow limiting stenosis. Mild dominance of the left vertebral artery. No vertebral stenosis or beading Skeleton: C4-5 and C5-6 ACDF. C4-5 solid arthrodesis. No definite solid arthrodesis at C5-6, but also no hardware failure. Other neck: No acute finding Upper chest: Airway thickening and emphysema. IMPRESSION: 1. No emergent finding. 2. Atherosclerosis without flow limiting stenosis of major vessels. Widely patent right carotid stent. 3. Aortic Atherosclerosis (ICD10-I70.0) and Emphysema (ICD10-J43.9). Electronically Signed   By: Tiburcio Pea M.D.    On: 08/19/2021 11:30   US Carotid Bilateral  Result Date: 08/20/2021 CLINICAL DATA:  61 year old female with history of stroke. EXAM: BILATERAL CAROTID DUPLEX ULTRASOUND TECHNIQUE: Wallace Cullens scale imaging, color Doppler and duplex ultrasound were performed of bilateral carotid and vertebral arteries in the neck. COMPARISON:  None. FINDINGS: Criteria: Quantification of carotid stenosis is based on velocity parameters that correlate the residual internal carotid diameter with NASCET-based stenosis levels, using the diameter of the distal internal carotid lumen as the denominator for stenosis measurement. The following velocity measurements were obtained: RIGHT ICA: Peak systolic velocity 156 cm/sec, End diastolic velocity 37 cm/sec CCA: Peak systolic velocity 61 cm/sec SYSTOLIC ICA/CCA RATIO:  2.6 ECA: Peak systolic velocity 163 cm/sec LEFT ICA: Peak systolic velocity 94 cm/sec, End diastolic velocity 26 cm/sec CCA: 74 cm/sec SYSTOLIC ICA/CCA RATIO:  1.3 ECA: 146 cm/sec RIGHT CAROTID ARTERY: Diffuse carotid intimal thickening. Distal common carotid artery indwelling stent appears patent. Multifocal heterogeneous atherosclerotic plaque in the uncovered common and internal carotid arteries. Limited grayscale evaluation of the internal carotid artery, however focally elevated flow velocity in the midportion, diminished distally. Normal low resistance waveforms. RIGHT VERTEBRAL ARTERY:  Antegrade flow. LEFT CAROTID ARTERY: Diffuse carotid intimal thickening with scattered mild multifocal atherosclerotic plaque, most prominent about the carotid bifurcation. No significant tortuosity. Normal low resistance waveforms. LEFT VERTEBRAL ARTERY:  Antegrade flow. Upper extremity non-invasive blood pressures: Not obtained. IMPRESSION: 1. Right carotid artery system: 50-69% focal stenosis in the mid internal carotid artery likely secondary to atherosclerotic plaque formation. The indwelling common carotid artery stent appears  patent. 2. Left carotid artery system: Less than 50% stenosis secondary to mild multifocal atherosclerotic plaque formation. 3.  Vertebral artery system: Patent with antegrade flow bilaterally. Marliss Coots, MD [facility] Electronically Signed   By: Marliss Coots M.D.   On: 08/20/2021 08:03    Scheduled Meds:   stroke: mapping our early stages of recovery book   Does not apply Once   amLODipine  10 mg Oral Daily   aspirin EC  81 mg Oral Daily   atorvastatin  40 mg Oral Daily   calcium carbonate  400 mg of elemental calcium Oral BID WC   clopidogrel  75 mg Oral Daily   enoxaparin (LOVENOX) injection  40 mg Subcutaneous Q24H   hydrochlorothiazide  25 mg Oral Daily   irbesartan  150 mg Oral Daily   melatonin  2.5 mg Oral QHS   nicotine  7 mg Transdermal Daily   Continuous Infusions:  sodium chloride 100 mL/hr at 08/20/21 0606    LOS: 3 days   Merlene Laughter, DO [facility] PAGER is on [employer]  If 7PM-7AM, please contact night-coverage [email or web address]

## 2021-08-21 ENCOUNTER — Inpatient Hospital Stay (HOSPITAL_COMMUNITY)
Admit: 2021-08-21 | Discharge: 2021-08-21 | Disposition: A | Discharge status: Home / Self Care | Payer: Medicare Other | Attending: Cardiovascular Disease | Admitting: Cardiovascular Disease

## 2021-08-21 ENCOUNTER — Encounter: Payer: Self-pay | Admitting: Cardiovascular Disease

## 2021-08-21 ENCOUNTER — Inpatient Hospital Stay (HOSPITAL_COMMUNITY)
Admit: 2021-08-21 | Discharge: 2021-08-21 | Disposition: A | Discharge status: Home / Self Care | Payer: Medicare Other | Attending: Physician Assistant | Admitting: Physician Assistant

## 2021-08-21 ENCOUNTER — Encounter
Admission: EM | Disposition: A | Discharge status: Home / Self Care | Payer: Self-pay | Source: Home / Self Care | Attending: Internal Medicine

## 2021-08-21 DIAGNOSIS — H3411 Central retinal artery occlusion, right eye: Secondary | ICD-10-CM

## 2021-08-21 DIAGNOSIS — I6389 Other cerebral infarction: Secondary | ICD-10-CM | POA: Diagnosis not present

## 2021-08-21 HISTORY — PX: TEE WITHOUT CARDIOVERSION: SHX5443

## 2021-08-21 LAB — COMPREHENSIVE METABOLIC PANEL
ALT: 19 U/L (ref 0–44)
AST: 17 U/L (ref 15–41)
Albumin: 4.2 g/dL (ref 3.5–5.0)
Alkaline Phosphatase: 90 U/L (ref 38–126)
Anion gap: 9 (ref 5–15)
BUN: 20 mg/dL (ref 8–23)
CO2: 28 mmol/L (ref 22–32)
Calcium: 9.6 mg/dL (ref 8.9–10.3)
Chloride: 102 mmol/L (ref 98–111)
Creatinine, Ser: 0.5 mg/dL (ref 0.44–1.00)
GFR, Estimated: >60 mL/min (ref >60)
Glucose, Bld: 150 mg/dL — ABNORMAL HIGH (ref 70–99)
Potassium: 3 mmol/L — ABNORMAL LOW (ref 3.5–5.1)
Sodium: 139 mmol/L (ref 135–145)
Total Bilirubin: 0.6 mg/dL (ref 0.3–1.2)
Total Protein: 7.7 g/dL (ref 6.5–8.1)

## 2021-08-21 LAB — CBC WITH DIFFERENTIAL/PLATELET
Abs Immature Granulocytes: 0.02 10*3/uL (ref 0.00–0.07)
Basophils Absolute: 0.1 10*3/uL (ref 0.0–0.1)
Basophils Relative: 1 %
Eosinophils Absolute: 0.2 10*3/uL (ref 0.0–0.5)
Eosinophils Relative: 3 %
HCT: 45.3 % (ref 36.0–46.0)
Hemoglobin: 15.1 g/dL — ABNORMAL HIGH (ref 12.0–15.0)
Immature Granulocytes: 0 %
Lymphocytes Relative: 33 %
Lymphs Abs: 2.1 10*3/uL (ref 0.7–4.0)
MCH: 29.6 pg (ref 26.0–34.0)
MCHC: 33.3 g/dL (ref 30.0–36.0)
MCV: 88.8 fL (ref 80.0–100.0)
Monocytes Absolute: 0.6 10*3/uL (ref 0.1–1.0)
Monocytes Relative: 9 %
Neutro Abs: 3.5 10*3/uL (ref 1.7–7.7)
Neutrophils Relative %: 54 %
Platelets: 200 10*3/uL (ref 150–400)
RBC: 5.1 MIL/uL (ref 3.87–5.11)
RDW: 12.7 % (ref 11.5–15.5)
WBC: 6.5 10*3/uL (ref 4.0–10.5)
nRBC: 0 % (ref 0.0–0.2)

## 2021-08-21 LAB — MAGNESIUM: Magnesium: 1.8 mg/dL (ref 1.7–2.4)

## 2021-08-21 LAB — HIV ANTIBODY (ROUTINE TESTING W REFLEX): HIV Screen 4th Generation wRfx: NONREACTIVE

## 2021-08-21 LAB — PHOSPHORUS: Phosphorus: 3.1 mg/dL (ref 2.5–4.6)

## 2021-08-21 LAB — GLUCOSE, CAPILLARY: Glucose-Capillary: 116 mg/dL — ABNORMAL HIGH (ref 70–99)

## 2021-08-21 SURGERY — ECHOCARDIOGRAM, TRANSESOPHAGEAL
Anesthesia: Moderate Sedation

## 2021-08-21 MED ORDER — POTASSIUM CHLORIDE CRYS ER 20 MEQ PO TBCR: 40.0000 meq | EXTENDED_RELEASE_TABLET | Freq: Two times a day (BID) | ORAL | Status: DC

## 2021-08-21 MED ORDER — FENTANYL CITRATE (PF) 100 MCG/2ML IJ SOLN
INTRAMUSCULAR | Status: AC
  Filled 2021-08-21: qty 2

## 2021-08-21 MED ORDER — LIDOCAINE VISCOUS HCL 2 % MT SOLN
OROMUCOSAL | Status: AC | PRN
  Administered 2021-08-21: 15 mL via OROMUCOSAL

## 2021-08-21 MED ORDER — ATORVASTATIN CALCIUM 20 MG PO TABS
80.0000 mg | ORAL_TABLET | Freq: Every day | ORAL | Status: DC
  Administered 2021-08-21: 80 mg via ORAL
  Filled 2021-08-21: qty 4

## 2021-08-21 MED ORDER — SODIUM CHLORIDE 0.9 % IV SOLN: INTRAVENOUS | Status: DC

## 2021-08-21 MED ORDER — ATORVASTATIN CALCIUM 20 MG PO TABS: 80.0000 mg | ORAL_TABLET | Freq: Every day | ORAL | Status: DC

## 2021-08-21 MED ORDER — FENTANYL CITRATE (PF) 100 MCG/2ML IJ SOLN
INTRAMUSCULAR | Status: AC | PRN
  Administered 2021-08-21 (×2): 50 ug via INTRAVENOUS

## 2021-08-21 MED ORDER — MIDAZOLAM HCL 2 MG/2ML IJ SOLN
INTRAMUSCULAR | Status: AC | PRN
  Administered 2021-08-21 (×2): 2 mg via INTRAVENOUS

## 2021-08-21 MED ORDER — BUTAMBEN-TETRACAINE-BENZOCAINE 2-2-14 % EX AERO
INHALATION_SPRAY | CUTANEOUS | Status: AC
  Filled 2021-08-21: qty 5

## 2021-08-21 MED ORDER — ATORVASTATIN CALCIUM 80 MG PO TABS: 80.0000 mg | ORAL_TABLET | Freq: Every day | ORAL | 0 refills | Status: AC

## 2021-08-21 MED ORDER — LIDOCAINE VISCOUS HCL 2 % MT SOLN
OROMUCOSAL | Status: AC
  Filled 2021-08-21: qty 15

## 2021-08-21 MED ORDER — MAGNESIUM SULFATE 2 GM/50ML IV SOLN
2.0000 g | Freq: Once | INTRAVENOUS | Status: AC
  Administered 2021-08-21: 2 g via INTRAVENOUS
  Filled 2021-08-21: qty 50

## 2021-08-21 MED ORDER — POTASSIUM CHLORIDE 10 MEQ/100ML IV SOLN
10.0000 meq | INTRAVENOUS | Status: AC
  Administered 2021-08-21 (×3): 10 meq via INTRAVENOUS
  Filled 2021-08-21 (×4): qty 100

## 2021-08-21 MED ORDER — MIDAZOLAM HCL 2 MG/2ML IJ SOLN
INTRAMUSCULAR | Status: AC
  Filled 2021-08-21: qty 4

## 2021-08-21 MED ORDER — NICOTINE 7 MG/24HR TD PT24: 7.0000 mg | MEDICATED_PATCH | Freq: Every day | TRANSDERMAL | 0 refills | Status: DC

## 2021-08-21 MED ORDER — BUTAMBEN-TETRACAINE-BENZOCAINE 2-2-14 % EX AERO
INHALATION_SPRAY | CUTANEOUS | Status: AC | PRN
  Administered 2021-08-21: 1 via TOPICAL

## 2021-08-21 MED ORDER — CLOPIDOGREL BISULFATE 75 MG PO TABS: 75.0000 mg | ORAL_TABLET | Freq: Every day | ORAL | 11 refills | Status: DC

## 2021-08-21 MED ORDER — ASPIRIN EC 81 MG PO TBEC: 81.0000 mg | DELAYED_RELEASE_TABLET | Freq: Every day | ORAL | 0 refills | Status: AC

## 2021-08-21 MED ORDER — MIDAZOLAM HCL 2 MG/2ML IJ SOLN
INTRAMUSCULAR | Status: AC
  Filled 2021-08-21: qty 2

## 2021-08-21 NOTE — H&P (Signed)
H&P Addendum, pre-transesophageal echo  Patient was seen and evaluated prior to -transesophageal echo procedure Symptoms, prior testing details again confirmed with the patient Patient examined, no significant change from prior exam Lab work reviewed in detail personally by myself Patient understands risk and benefit of the procedure, including risk of complications from moderate sedation, risk of aspiration, pneumonia, respiratory distress, death Patient willing to proceed.  Signed, Esmond Plants, MD, Ph.D [facility]

## 2021-08-21 NOTE — Plan of Care (Deleted)
Neurology updated recommendations:  - Goal normotension - Continue ASA, plavix, statin - Patient is undergoing TEE today, will make final recommendations tomorrow after those results are available.  Bing Neighbors, MD [facility] [phone]  If 7pm- 7am, please page neurology on call as listed in AMION.

## 2021-08-21 NOTE — Progress Notes (Signed)
Transesophageal Echocardiogram :  Indication: CVA Requesting/ordering  physician:   Procedure: Benzocaine spray x2 and 2 mls x 2 of viscous lidocaine were given orally to provide local anesthesia to the oropharynx. The patient was positioned supine on the left side, bite block provided. The patient was moderately sedated with the doses of versed and fentanyl as detailed below.  Using digital technique an omniplane probe was advanced into the distal esophagus without incident.   Moderate sedation: 1. Sedation used:  Versed: 4 mg IV push, Fentanyl: 100 mcg 2. Time administered: 12:50 PM   time when patient started recovery: 1:15 PM Total sedation time 25 minutes 3. I was face to face during this time  See report in EPIC  for complete details: In brief,  No left atrial or left atrial appendage thrombus No LV thrombus transgastric imaging revealed normal LV function with no RWMAs and no mural apical thrombus.  .  Estimated ejection fraction was >55%.  Right sided cardiac chambers were normal with no evidence of pulmonary hypertension.  Imaging of the septum showed no ASD or VSD Bubble study was negative for shunt 2D and color flow confirmed no PFO  The LA was well visualized in orthogonal views.  There was no spontaneous contrast and no thrombus in the LA and LA appendage   The descending thoracic aorta had severe diffuse atheroma, extending into the aortic arch   Julien Nordmann 08/21/2021 1:29 PM

## 2021-08-21 NOTE — Progress Notes (Signed)
*  PRELIMINARY RESULTS* Echocardiogram Echocardiogram Transesophageal has been performed.  Cristela Blue 08/21/2021, 1:32 PM

## 2021-08-21 NOTE — Discharge Summary (Signed)
Physician Discharge Summary  [name] [id number] DOB: Sep 23, 1960 DOA: 08/17/2021  PCP: Remi Haggard, FNP  Admit date: 08/17/2021 Discharge date: 08/21/2021  Admitted From: Home Disposition: Home  Recommendations for Outpatient Follow-up:  Follow up with PCP in 1-2 weeks Follow-up with ophthalmology in the outpatient setting in 1 to 2 weeks Follow-up with neurology in outpatient setting and Dr. Quinn Axe is arranging follow-up Continue with Zio patch for 14 days and was placed by cardiology prior to discharge Please obtain CMP/CBC, Mag, Phos in one week Please follow up on the following pending results:  Home Health: No  Equipment/Devices: None     Discharge Condition: Stable  CODE STATUS: FULL CODE   Diet recommendation: Heart Healthy Diet   Brief/Interim Summary: The patient is a 61 year old Caucasian female with a past medical history significant for but not limited to anxiety and depression, diabetes mellitus type 2, history of hypertension, she of TIA coronary artery disease as well as other comorbidities who presented to the emergency room with acute onset of right eye visual loss associate with mild headache without dizziness.  She felt that she had visual field defects over the nasal part of her left eye.  She denies any paresthesias or focal muscle weakness and no facial droop or numbness.  States that she went to her ophthalmologist who then referred her to the ED for further work-up given her visual field loss.  Ophthalmology was concerned for a central retinal artery occlusion.  She had a noncontrast CT scan done which showed mild chronic ischemic white matter changes with no acute intra cranial abnormality.  She is given 3.5 mg of aspirin as well as 650 mg of acetaminophen and admitted to medical telemetry bed.  Ophthalmology and neurology were consulted and she is diagnosed with a right central retinal artery occlusion and sent to ED for assessment of possible CVA and  further work-up of this central retinal artery occlusion.  PT OT evaluated and recommending no follow-up and SLP recommended no follow-up so she was placed on a diet.  Neurology evaluated and recommending completing a stroke work-up with a carotid ultrasound, hemoglobin A1c continue cardiac telemetry they feel there is no need to obtain a TTE given that she had a study 2 months ago but they did recommend a TEE with bubble study.  Dr. Cheral Marker also added a CT of the neck to more fully visualize the internal carotid arteries.  She is been placed on aspirin and Plavix and CTA of the neck has been done but her carotid ultrasound has still not been done yet so this will be ordered.  We will notify cardiology for TEE with bubble study and plan is for TEE with bubble study on 08/21/2021.  Neurology recommends continuing aspirin, Plavix and statin make final recommendations tomorrow after TEE results are obtained  TEE was finally done and showed no left atrial or left atrial appendage thrombus and no LV thrombus and the transgastric imaging revealed a normal LV function with no right wall motion abnormalities and no mural apical thrombus in the estimated EF was greater than 55%.  Nephrology recommended dual antiplatelet therapy for 3 weeks and then just Plavix monotherapy and increasing the patient's statin 80 mg p.o. daily.  She was okay from a neuro standpoint and medically stable for discharge at this time and follow-up with PCP, neurology as well as ophthalmology outpatient setting.  Prior to discharge a Zio patch was arranged to be placed on the patient and this will be  continued for 14 days and cardiology is arranging this.    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 Cheral Marker is aware and will make further recommendations -Ophthalmology was consulted and Dr. Wallace Going tingling is denied except recommending stroke work-up with the GCA in the differential -She is undergoing an MRA and MRI of the head and MRA and MRI showed no acute intracranial abnormalities with multiple small vessel infarcts and sequela of chronic small vessel disease and a normal intracranial MRA -We will check an echocardiogram and further work-up per neurology but neurology feels that she does not need a transthoracic echocardiogram anymore and she would benefit rather with a transesophageal echocardiogram with a bubble study - We will reach out to cardiology for TEE with bubble study to be done -CTA of the neck done and showed "No emergent finding. Atherosclerosis without flow limiting stenosis of major vessels. Widely patent right carotid stent. Aortic Atherosclerosis and Emphysema."  -Carotid Dopplers showed "Right carotid artery system: 50-69% focal stenosis in the mid internal carotid artery likely secondary to atherosclerotic plaque formation. The indwelling common carotid artery stent appears patent. Left carotid artery system: Less than 50% stenosis secondary to mild multifocal atherosclerotic plaque formation. Vertebral artery system: Patent with antegrade flow bilaterally." -Lipid panel done and showed a total cholesterol/HDL ratio 3.5, cholesterol level 167, HDL 48, LDL of 94, triglycerides 125, VLDL of 25 -Hemoglobin A1c was 7.1 -We have started aspirin 81 mg daily, clopidogrel 75 mg p.o. daily and patient is on a statin 40 mg p.o. atorvastatin this was increased to 80 prior to discharge; after the results of the TEE neurology recommended dual antiplatelet therapy for 3 weeks and then just Plavix alone for monotherapy and follow-up with outpatient neurology. -PT  and OT recommending no follow-up and speech is signed off as well -Appreciate further neurology evaluation and care and will allow for permissive hypertension for now; neurology recommends obtaining CTA of the neck, carotid ultrasound as well as a TEE with bubble study for further work-up -TEE with bubble study being arranged for 08/21/2021 and complete by Dr. Rockey Situ. -TEE showed in brief "No left atrial or left atrial appendage thrombus No LV thrombus transgastric imaging revealed normal LV function with no RWMAs and no mural apical thrombus.  .  Estimated ejection fraction was >55%.  Right sided cardiac chambers were normal with no evidence of pulmonary hypertension.   Imaging of the septum showed no ASD or VSD Bubble study was negative for shunt 2D and color flow confirmed no PFO   The LA was well visualized in orthogonal views.  There was no spontaneous contrast and no thrombus in the LA and LA appendage    The descending thoracic aorta had severe diffuse atheroma, extending into the aortic arch   -Neurology recommend smoking cessation counseling as well as benefits of daily light exercise by walking within 30 minutes/day and outpatient ophthalmology follow-up -Patient was medically stable to be discharged home at this time and follow-up with ophthalmology and neurology outpatient setting and prior to discharge a Zio patch was placed   Essential Hypertension -She takes Norvasc and hydrochlorothiazide as well as Avapro in the outpatient  setting which have now been resumed here -Continue monitor blood pressures per protocol  -Last blood pressure reading was 119/97   Anxiety and depression -Continue with home clonazepam 0.5 mg p.o. daily as needed anxiety   Dyslipidemia/hyperlipidemia -Lipid panel as above -Continue with Atorvastatin 40 mg daily   Peripheral Neuropathy -Continue Gabapentin 300 mg p.o. 3 times daily   Hyperglycemia in the setting of New onset Diabetes Mellitus Type  2 -Patient's blood sugar on admission was 132 and now ranging from 132-207 -Continue monitor blood sugars per protocol  -HbA1c was 7.1 -If necessary will place on sensitive NovoLog/scale insulin AC  Hypokalemia -Potassium was 3.0 -Replete with IV KCl prior to discharge on p.o. KCl -Continue monitor and trend and repeat in outpatient setting within 1 week   Tobacco abuse -Smoking cessation counseling given -We will initiate the patient on nicotine patch 7 mg transdermally every 24   Indigestion/GERD -Initiate calcium carbonate chewable tablets 400 mg of elemental calcium twice daily with meals   Erythrocytosis, improved -Mild and patient's hemoglobin went from 14.7 -> 15.2; Now Hgb/Hct is 13.7/41.9 yesterday and today is 15.1/45.3 -Likely in the setting of smoking -Continue monitor and trend and repeat CBC within 1 week.  Discharge Instructions  Discharge Instructions     Ambulatory referral to Neurology   Complete by: As directed    Appt in 4-6 wks   Call MD for:  difficulty breathing, headache or visual disturbances   Complete by: As directed    Call MD for:  extreme fatigue   Complete by: As directed    Call MD for:  hives   Complete by: As directed    Call MD for:  persistant dizziness or light-headedness   Complete by: As directed    Call MD for:  persistant nausea and vomiting   Complete by: As directed    Call MD for:  redness, tenderness, or signs of infection (pain, swelling, redness, odor or green/yellow discharge around incision site)   Complete by: As directed    Call MD for:  severe uncontrolled pain   Complete by: As directed    Call MD for:  temperature >100.4   Complete by: As directed    Diet - low sodium heart healthy   Complete by: As directed    Discharge instructions   Complete by: As directed    You were cared for by a hospitalist during your hospital stay. If you have any questions about your discharge medications or the care you received while  you were in the hospital after you are discharged, you can call the unit and ask to speak with the hospitalist on call if the hospitalist that took care of you is not available. Once you are discharged, your primary care physician will handle any further medical issues. Please note that NO REFILLS for any discharge medications will be authorized once you are discharged, as it is imperative that you return to your primary care physician (or establish a relationship with a primary care physician if you do not have one) for your aftercare needs so that they can reassess your need for medications and monitor your lab values.  Follow up with PCP, Neurology, Opthalmology and Cardiology in the outpatient setting. Take all medications as prescribed. If symptoms change or worsen please return to the ED for evaluation   Increase activity slowly   Complete by: As directed       Allergies as of 08/21/2021   No Known Allergies  Medication List     STOP taking these medications    fexofenadine 180 MG tablet Commonly known as: ALLEGRA       TAKE these medications    amLODipine 10 MG tablet Commonly known as: NORVASC Take 1 tablet (10 mg total) by mouth daily.   aspirin EC 81 MG tablet Take 1 tablet (81 mg total) by mouth daily for 21 days. Swallow whole.   atorvastatin 80 MG tablet Commonly known as: LIPITOR Take 1 tablet (80 mg total) by mouth daily. Start taking on: August 22, 2021 What changed:  medication strength how much to take   clonazePAM 0.5 MG tablet Commonly known as: KLONOPIN Take 0.5 mg by mouth daily as needed.   clopidogrel 75 MG tablet Commonly known as: PLAVIX Take 1 tablet (75 mg total) by mouth daily. What changed: when to take this   gabapentin 300 MG capsule Commonly known as: Neurontin Take 1 capsule (300 mg total) by mouth 3 (three) times daily as needed (nerve pain).   hydrochlorothiazide 25 MG tablet Commonly known as: HYDRODIURIL Take 25 mg by  mouth daily.   irbesartan 150 MG tablet Commonly known as: Avapro Take 1 tablet (150 mg total) by mouth daily.   nicotine 7 mg/24hr patch Commonly known as: NICODERM CQ - dosed in mg/24 hr Place 1 patch (7 mg total) onto the skin daily. Start taking on: August 22, 2021        No Known Allergies  Consultations: Neurology Cardiology for TEE  Procedures/Studies: CT HEAD WO CONTRAST  Result Date: 08/17/2021 CLINICAL DATA:  Acute neuro deficit.  Stroke suspected. EXAM: CT HEAD WITHOUT CONTRAST TECHNIQUE: Contiguous axial images were obtained from the base of the skull through the vertex without intravenous contrast. RADIATION DOSE REDUCTION: This exam was performed according to the departmental dose-optimization program which includes automated exposure control, adjustment of the mA and/or kV according to patient size and/or use of iterative reconstruction technique. COMPARISON:  CT brain 05/25/2013 FINDINGS: Brain: The ventricles are normal in size and configuration. The basilar cisterns are patent. No mass, mass effect, or midline shift. No acute intracranial hemorrhage is seen. No abnormal extra-axial fluid collection. Moderate periventricular and subcortical white matter patchy hypodensities are similar to prior, nonspecific but most likely secondary to chronic ischemic white matter changes. Well-circumscribed small old lacunar infarct within the inferior right cerebellar hemisphere (axial image 7, coronal image 43). Preservation of the normal cortical gray-white interface without CT evidence of an acute major vascular territorial cortical based infarction. Vascular: There are intracranial atherosclerotic calcifications. Skull: Normal. Negative for fracture or focal lesion. Sinuses/Orbits: The visualized orbits are unremarkable. The visualized paranasal sinuses and mastoid air cells are clear. Other: None. IMPRESSION:: IMPRESSION: 1. No acute intracranial process. 2. Moderate chronic ischemic  white matter changes, similar to prior. Electronically Signed   By: Neita Garnet M.D.   On: 08/17/2021 17:26   CT ANGIO NECK W OR WO CONTRAST  Result Date: 08/19/2021 CLINICAL DATA:  Carotid artery dissection suspected EXAM: CT ANGIOGRAPHY NECK TECHNIQUE: Multidetector CT imaging of the neck was performed using the standard protocol during bolus administration of intravenous contrast. Multiplanar CT image reconstructions and MIPs were obtained to evaluate the vascular anatomy. Carotid stenosis measurements (when applicable) are obtained utilizing NASCET criteria, using the distal internal carotid diameter as the denominator. RADIATION DOSE REDUCTION: This exam was performed according to the departmental dose-optimization program which includes automated exposure control, adjustment of the mA and/or kV according to patient size and/or use  of iterative reconstruction technique. CONTRAST:  49mL OMNIPAQUE IOHEXOL 350 MG/ML SOLN COMPARISON:  01/10/2020 CTA.  Brain MRI from yesterday FINDINGS: Aortic arch: Extensive atheromatous plaque. Three vessel branching. No visible dilatation Right carotid system: Atheromatous wall thickening of the distal common carotid and proximal ICA. Right carotid stenting centered at the bifurcation. No in stent stenosis or adjacent dissection. Left carotid system: Diffuse atheromatous wall thickening of the common carotid and proximal ICA. No flow limiting stenosis or ulceration. Vertebral arteries: Proximal subclavian atherosclerosis without flow limiting stenosis. Mild dominance of the left vertebral artery. No vertebral stenosis or beading Skeleton: C4-5 and C5-6 ACDF. C4-5 solid arthrodesis. No definite solid arthrodesis at C5-6, but also no hardware failure. Other neck: No acute finding Upper chest: Airway thickening and emphysema. IMPRESSION: 1. No emergent finding. 2. Atherosclerosis without flow limiting stenosis of major vessels. Widely patent right carotid stent. 3. Aortic  Atherosclerosis (ICD10-I70.0) and Emphysema (ICD10-J43.9). Electronically Signed   By: Jorje Guild M.D.   On: 08/19/2021 11:30   MR ANGIO HEAD WO CONTRAST  Result Date: 08/18/2021 CLINICAL DATA:  Acute neurologic deficit.  Altered right eye vision. EXAM: MRI HEAD WITHOUT CONTRAST MRA HEAD WITHOUT CONTRAST TECHNIQUE: Multiplanar, multi-echo pulse sequences of the brain and surrounding structures were acquired without intravenous contrast. Angiographic images of the Circle of Willis were acquired using MRA technique without intravenous contrast. COMPARISON:  No pertinent prior exam. FINDINGS: MRI HEAD FINDINGS Brain: No acute infarct, mass effect or extra-axial collection. Right parietal focus of chronic microhemorrhage. Old right cerebellar infarct. Multiple old basal ganglia small vessel infarcts. Hyperintense T2-weighted signal is moderately widespread throughout the white matter. Generalized volume loss without a clear lobar predilection. The midline structures are normal. Vascular: Major flow voids are preserved. Skull and upper cervical spine: Normal calvarium and skull base. Visualized upper cervical spine and soft tissues are normal. Sinuses/Orbits:No paranasal sinus fluid levels or advanced mucosal thickening. No mastoid or middle ear effusion. Normal orbits. MRA HEAD FINDINGS POSTERIOR CIRCULATION: --Vertebral arteries: Normal --Inferior cerebellar arteries: Normal. --Basilar artery: Normal. --Superior cerebellar arteries: Normal. --Posterior cerebral arteries: Normal. ANTERIOR CIRCULATION: --Intracranial internal carotid arteries: Normal. --Anterior cerebral arteries (ACA): Normal. --Middle cerebral arteries (MCA): Normal. ANATOMIC VARIANTS: None IMPRESSION: 1. No acute intracranial abnormality. 2. Multiple old small vessel infarcts and sequelae of chronic small vessel disease. 3. Normal intracranial MRA. Electronically Signed   By: Ulyses Jarred M.D.   On: 08/18/2021 02:55   MR BRAIN WO  CONTRAST  Result Date: 08/18/2021 CLINICAL DATA:  Acute neurologic deficit.  Altered right eye vision. EXAM: MRI HEAD WITHOUT CONTRAST MRA HEAD WITHOUT CONTRAST TECHNIQUE: Multiplanar, multi-echo pulse sequences of the brain and surrounding structures were acquired without intravenous contrast. Angiographic images of the Circle of Willis were acquired using MRA technique without intravenous contrast. COMPARISON:  No pertinent prior exam. FINDINGS: MRI HEAD FINDINGS Brain: No acute infarct, mass effect or extra-axial collection. Right parietal focus of chronic microhemorrhage. Old right cerebellar infarct. Multiple old basal ganglia small vessel infarcts. Hyperintense T2-weighted signal is moderately widespread throughout the white matter. Generalized volume loss without a clear lobar predilection. The midline structures are normal. Vascular: Major flow voids are preserved. Skull and upper cervical spine: Normal calvarium and skull base. Visualized upper cervical spine and soft tissues are normal. Sinuses/Orbits:No paranasal sinus fluid levels or advanced mucosal thickening. No mastoid or middle ear effusion. Normal orbits. MRA HEAD FINDINGS POSTERIOR CIRCULATION: --Vertebral arteries: Normal --Inferior cerebellar arteries: Normal. --Basilar artery: Normal. --Superior cerebellar arteries: Normal. --Posterior cerebral  arteries: Normal. ANTERIOR CIRCULATION: --Intracranial internal carotid arteries: Normal. --Anterior cerebral arteries (ACA): Normal. --Middle cerebral arteries (MCA): Normal. ANATOMIC VARIANTS: None IMPRESSION: 1. No acute intracranial abnormality. 2. Multiple old small vessel infarcts and sequelae of chronic small vessel disease. 3. Normal intracranial MRA. Electronically Signed   By: Ulyses Jarred M.D.   On: 08/18/2021 02:55   US Carotid Bilateral  Result Date: 08/20/2021 CLINICAL DATA:  61 year old female with history of stroke. EXAM: BILATERAL CAROTID DUPLEX ULTRASOUND TECHNIQUE: Pearline Cables scale  imaging, color Doppler and duplex ultrasound were performed of bilateral carotid and vertebral arteries in the neck. COMPARISON:  None. FINDINGS: Criteria: Quantification of carotid stenosis is based on velocity parameters that correlate the residual internal carotid diameter with NASCET-based stenosis levels, using the diameter of the distal internal carotid lumen as the denominator for stenosis measurement. The following velocity measurements were obtained: RIGHT ICA: Peak systolic velocity [id number] cm/sec, End diastolic velocity 37 cm/sec CCA: Peak systolic velocity 61 cm/sec SYSTOLIC ICA/CCA RATIO:  2.6 ECA: Peak systolic velocity [id number] cm/sec LEFT ICA: Peak systolic velocity 94 cm/sec, End diastolic velocity 26 cm/sec CCA: 74 cm/sec SYSTOLIC ICA/CCA RATIO:  1.3 ECA: 146 cm/sec RIGHT CAROTID ARTERY: Diffuse carotid intimal thickening. Distal common carotid artery indwelling stent appears patent. Multifocal heterogeneous atherosclerotic plaque in the uncovered common and internal carotid arteries. Limited grayscale evaluation of the internal carotid artery, however focally elevated flow velocity in the midportion, diminished distally. Normal low resistance waveforms. RIGHT VERTEBRAL ARTERY:  Antegrade flow. LEFT CAROTID ARTERY: Diffuse carotid intimal thickening with scattered mild multifocal atherosclerotic plaque, most prominent about the carotid bifurcation. No significant tortuosity. Normal low resistance waveforms. LEFT VERTEBRAL ARTERY:  Antegrade flow. Upper extremity non-invasive blood pressures: Not obtained. IMPRESSION: 1. Right carotid artery system: 50-69% focal stenosis in the mid internal carotid artery likely secondary to atherosclerotic plaque formation. The indwelling common carotid artery stent appears patent. 2. Left carotid artery system: Less than 50% stenosis secondary to mild multifocal atherosclerotic plaque formation. 3.  Vertebral artery system: Patent with antegrade flow bilaterally. Ruthann Cancer, MD [facility] Electronically Signed   By: Ruthann Cancer M.D.   On: 08/20/2021 08:03   ECHO TEE  Result Date: 08/21/2021    TRANSESOPHOGEAL ECHO REPORT   Patient Name:   AKEBA SELLE Date of Exam: 08/21/2021 Medical Rec #:  [id number]        Height:       60.0 in Accession #:    [id number]       Weight:       126.3 lb Date of Birth:  [birth year]       BSA:          1.535 m Patient Age:    61 years         BP:           153/64 mmHg Patient Gender: F                HR:           88 bpm. Exam Location:  [facility] Procedure: Transesophageal Echo, Cardiac Doppler, Color Doppler and Saline            Contrast Bubble Study Indications:     Central retinal artery occlusion of right eye  History:         Patient has prior history of Echocardiogram examinations, most                  recent  06/12/2021. Signs/Symptoms:Murmur; Risk Factors:Diabetes                  and Hypertension.  Sonographer:     Cristela Blue Referring Phys:  [id number] Sondra Barges Diagnosing Phys: Julien Nordmann MD PROCEDURE: After discussion of the risks and benefits of a TEE, an informed consent was obtained from the patient. TEE procedure time was 25 minutes. The transesophogeal probe was passed without difficulty through the esophogus of the patient. Imaged were obtained with the patient in a left lateral decubitus position. Local oropharyngeal anesthetic was provided with Benzocaine spray and Cetacaine. Sedation performed by performing physician. Patients was under conscious sedation during this procedure.  Anesthetic administered: of Fentanyl, 4.0mg  of Versed. Image quality was excellent. The patient's vital signs; including heart rate, blood pressure, and oxygen saturation; remained stable throughout the procedure. The patient developed no complications during the procedure. IMPRESSIONS  1. Left ventricular ejection fraction, by estimation, is 60 to 65%. The left ventricle has  normal function. The left ventricle has no regional wall motion abnormalities.  2. Right ventricular systolic function is normal. The right ventricular size is normal.  3. No left atrial/left atrial appendage thrombus was detected.  4. The mitral valve is normal in structure. No evidence of mitral valve regurgitation. No evidence of mitral stenosis.  5. The aortic valve is normal in structure. Aortic valve regurgitation is not visualized. No aortic stenosis is present.  6. There is Severe, mobile (Grade V) atheroma plaque involving the aortic arch and descending aorta.  7. The inferior vena cava is normal in size with greater than 50% respiratory variability, suggesting right atrial pressure of 3 mmHg.  8. Agitated saline contrast bubble study was negative, with no evidence of any interatrial shunt. Conclusion(s)/Recommendation(s): Normal biventricular function without evidence of hemodynamically significant valvular heart disease. FINDINGS  Left Ventricle: Left ventricular ejection fraction, by estimation, is 60 to 65%. The left ventricle has normal function. The left ventricle has no regional wall motion abnormalities. The left ventricular internal cavity size was normal in size. There is  no left ventricular hypertrophy. Right Ventricle: The right ventricular size is normal. No increase in right ventricular wall thickness. Right ventricular systolic function is normal. Left Atrium: Left atrial size was normal in size. No left atrial/left atrial appendage thrombus was detected. Right Atrium: Right atrial size was normal in size. Pericardium: There is no evidence of pericardial effusion. Mitral Valve: The mitral valve is normal in structure. No evidence of mitral valve regurgitation. No evidence of mitral valve stenosis. Tricuspid Valve: The tricuspid valve is normal in structure. Tricuspid valve regurgitation is not demonstrated. No evidence of tricuspid stenosis. Aortic Valve: The aortic valve is normal in  structure. Aortic valve regurgitation is not visualized. No aortic stenosis is present. Pulmonic Valve: The pulmonic valve was normal in structure. Pulmonic valve regurgitation is not visualized. No evidence of pulmonic stenosis. Aorta: The aortic root is normal in size and structure. There is severe, mobile (Grade V) atheroma plaque involving the aortic arch and descending aorta. Venous: The inferior vena cava is normal in size with greater than 50% respiratory variability, suggesting right atrial pressure of 3 mmHg. IAS/Shunts: No atrial level shunt detected by color flow Doppler. Agitated saline contrast was given intravenously to evaluate for intracardiac shunting. Agitated saline contrast bubble study was negative, with no evidence of any interatrial shunt. Julien Nordmann MD Electronically signed by Julien Nordmann MD Signature Date/Time: 08/21/2021/6:46:40 PM    Final  Subjective: Seen and examined at bedside and still does not have vision from the right eye.  Continues to feel well and ready to go home.  Has not smoked while she is been here and states that she is given up cigarettes.  No other concerns or close this time is stable for discharge at this time and follow-up with PCP and neurology outpatient setting  Discharge Exam: Vitals:   08/21/21 1400 08/21/21 1419  BP: (!) 115/53 (!) 119/97  Pulse: 63   Resp: 17 17  Temp:    SpO2: 93% 95%   Vitals:   08/21/21 1330 08/21/21 1345 08/21/21 1400 08/21/21 1419  BP: (!) 128/53 (!) 120/55 (!) 115/53 (!) 119/97  Pulse: 76 61 63   Resp: 20 20 17 17   Temp:      TempSrc:      SpO2: 93% 92% 93% 95%  Weight:      Height:       General: Pt is alert, awake, not in acute distress Cardiovascular: RRR, S1/S2 +, no rubs, no gallops Respiratory: Diminished bilaterally, no wheezing, no rhonchi; not wearing supplemental oxygen nasal cannula Abdominal: Soft, NT, ND, bowel sounds + Extremities: no edema, no cyanosis  The results of significant  diagnostics from this hospitalization (including imaging, microbiology, ancillary and laboratory) are listed below for reference.    Microbiology: Recent Results (from the past 240 hour(s))  Resp Panel by RT-PCR (Flu A&B, Covid) Nasopharyngeal Swab     Status: None   Collection Time: 08/17/21 11:44 PM   Specimen: Nasopharyngeal Swab; Nasopharyngeal(NP) swabs in vial transport medium  Result Value Ref Range Status   SARS Coronavirus 2 by RT PCR NEGATIVE NEGATIVE Final    Comment: (NOTE) SARS-CoV-2 target nucleic acids are NOT DETECTED.  The SARS-CoV-2 RNA is generally detectable in upper respiratory specimens during the acute phase of infection. The lowest concentration of SARS-CoV-2 viral copies this assay can detect is 138 copies/mL. A negative result does not preclude SARS-Cov-2 infection and should not be used as the sole basis for treatment or other patient management decisions. A negative result may occur with  improper specimen collection/handling, submission of specimen other than nasopharyngeal swab, presence of viral mutation(s) within the areas targeted by this assay, and inadequate number of viral copies(<138 copies/mL). A negative result must be combined with clinical observations, patient history, and epidemiological information. The expected result is Negative.  Fact Sheet for Patients:  EntrepreneurPulse.com.au  Fact Sheet for Healthcare Providers:  IncredibleEmployment.be  This test is no t yet approved or cleared by the [place] FDA and  has been authorized for detection and/or diagnosis of SARS-CoV-2 by FDA under an Emergency Use Authorization (EUA). This EUA will remain  in effect (meaning this test can be used) for the duration of the COVID-19 declaration under Section 564(b)(1) of the Act, 21 U.S.C.section 360bbb-3(b)(1), unless the authorization is terminated  or revoked sooner.       Influenza A by PCR NEGATIVE  NEGATIVE Final   Influenza B by PCR NEGATIVE NEGATIVE Final    Comment: (NOTE) The Xpert Xpress SARS-CoV-2/FLU/RSV plus assay is intended as an aid in the diagnosis of influenza from Nasopharyngeal swab specimens and should not be used as a sole basis for treatment. Nasal washings and aspirates are unacceptable for Xpert Xpress SARS-CoV-2/FLU/RSV testing.  Fact Sheet for Patients: EntrepreneurPulse.com.au  Fact Sheet for Healthcare Providers: IncredibleEmployment.be  This test is not yet approved or cleared by the [place] and has been authorized for  detection and/or diagnosis of SARS-CoV-2 by FDA under an Emergency Use Authorization (EUA). This EUA will remain in effect (meaning this test can be used) for the duration of the COVID-19 declaration under Section 564(b)(1) of the Act, 21 U.S.C. section 360bbb-3(b)(1), unless the authorization is terminated or revoked.  Performed at [facility], [address] [place], [place] [address]     Labs: BNP (last 3 results) No results for input(s): BNP in the last 8760 hours. Basic Metabolic Panel: Recent Labs  Lab 08/17/21 1619 08/18/21 1330 08/19/21 0538 08/20/21 0642 08/21/21 0620  NA 143 137 138 138 139  K 3.5 3.8 3.8 3.8 3.0*  CL 103 101 104 105 102  CO2 27 29 25 26 28   GLUCOSE 132* 207* 199* 150* 150*  BUN 17 20 21 23 20   CREATININE 0.64 0.61 0.51 0.50 0.50  CALCIUM 10.1 9.5 9.7 9.4 9.6  MG  --  2.0 2.1 1.7 1.8  PHOS  --  2.9 2.6 3.4 3.1   Liver Function Tests: Recent Labs  Lab 08/17/21 1619 08/18/21 1330 08/19/21 0538 08/20/21 0642 08/21/21 0620  AST 22 20 16 15 17   ALT 25 22 19 17 19   ALKPHOS 98 93 91 79 90  BILITOT 0.5 0.5 0.5 0.5 0.6  PROT 8.3* 7.5 7.0 6.7 7.7  ALBUMIN 4.7 4.2 4.2 3.6 4.2   No results for input(s): LIPASE, AMYLASE in the last 168 hours. No results for input(s): AMMONIA in the last 168 hours. CBC: Recent Labs  Lab 08/17/21 1619  08/18/21 1330 08/19/21 0538 08/20/21 0642 08/21/21 0620  WBC 10.3 7.9 8.9 6.2 6.5  NEUTROABS 5.7 4.3 6.1 3.2 3.5  HGB 15.9* 14.7 15.2* 13.7 15.1*  HCT 48.0* 43.5 44.6 41.9 45.3  MCV 91.1 89.9 89.6 90.5 88.8  PLT 242 220 235 179 200   Cardiac Enzymes: No results for input(s): CKTOTAL, CKMB, CKMBINDEX, TROPONINI in the last 168 hours. BNP: Invalid input(s): POCBNP CBG: Recent Labs  Lab 08/21/21 1224  GLUCAP 116*   D-Dimer No results for input(s): DDIMER in the last 72 hours. Hgb A1c Recent Labs    08/19/21 1539  HGBA1C 7.1*   Lipid Profile No results for input(s): CHOL, HDL, LDLCALC, TRIG, CHOLHDL, LDLDIRECT in the last 72 hours. Thyroid function studies No results for input(s): TSH, T4TOTAL, T3FREE, THYROIDAB in the last 72 hours.  Invalid input(s): FREET3 Anemia work up No results for input(s): VITAMINB12, FOLATE, FERRITIN, TIBC, IRON, RETICCTPCT in the last 72 hours. Urinalysis    Component Value Date/Time   COLORURINE YELLOW 05/18/2013 1120   APPEARANCEUR CLOUDY (A) 05/18/2013 1120   LABSPEC 1.017 05/18/2013 1120   PHURINE 6.5 05/18/2013 1120   GLUCOSEU NEGATIVE 05/18/2013 1120   HGBUR NEGATIVE 05/18/2013 1120   BILIRUBINUR NEGATIVE 05/18/2013 1120   KETONESUR NEGATIVE 05/18/2013 1120   PROTEINUR NEGATIVE 05/18/2013 1120   UROBILINOGEN 0.2 05/18/2013 1120   NITRITE NEGATIVE 05/18/2013 1120   LEUKOCYTESUR NEGATIVE 05/18/2013 1120   Sepsis Labs Invalid input(s): PROCALCITONIN,  WBC,  LACTICIDVEN Microbiology Recent Results (from the past 240 hour(s))  Resp Panel by RT-PCR (Flu A&B, Covid) Nasopharyngeal Swab     Status: None   Collection Time: 08/17/21 11:44 PM   Specimen: Nasopharyngeal Swab; Nasopharyngeal(NP) swabs in vial transport medium  Result Value Ref Range Status   SARS Coronavirus 2 by RT PCR NEGATIVE NEGATIVE Final    Comment: (NOTE) SARS-CoV-2 target nucleic acids are NOT DETECTED.  The SARS-CoV-2 RNA is generally detectable in upper  respiratory  specimens during the acute phase of infection. The lowest concentration of SARS-CoV-2 viral copies this assay can detect is 138 copies/mL. A negative result does not preclude SARS-Cov-2 infection and should not be used as the sole basis for treatment or other patient management decisions. A negative result may occur with  improper specimen collection/handling, submission of specimen other than nasopharyngeal swab, presence of viral mutation(s) within the areas targeted by this assay, and inadequate number of viral copies(<138 copies/mL). A negative result must be combined with clinical observations, patient history, and epidemiological information. The expected result is Negative.  Fact Sheet for Patients:  EntrepreneurPulse.com.au  Fact Sheet for Healthcare Providers:  IncredibleEmployment.be  This test is no t yet approved or cleared by the [place] FDA and  has been authorized for detection and/or diagnosis of SARS-CoV-2 by FDA under an Emergency Use Authorization (EUA). This EUA will remain  in effect (meaning this test can be used) for the duration of the COVID-19 declaration under Section 564(b)(1) of the Act, 21 U.S.C.section 360bbb-3(b)(1), unless the authorization is terminated  or revoked sooner.       Influenza A by PCR NEGATIVE NEGATIVE Final   Influenza B by PCR NEGATIVE NEGATIVE Final    Comment: (NOTE) The Xpert Xpress SARS-CoV-2/FLU/RSV plus assay is intended as an aid in the diagnosis of influenza from Nasopharyngeal swab specimens and should not be used as a sole basis for treatment. Nasal washings and aspirates are unacceptable for Xpert Xpress SARS-CoV-2/FLU/RSV testing.  Fact Sheet for Patients: EntrepreneurPulse.com.au  Fact Sheet for Healthcare Providers: IncredibleEmployment.be  This test is not yet approved or cleared by the [place] FDA and has been  authorized for detection and/or diagnosis of SARS-CoV-2 by FDA under an Emergency Use Authorization (EUA). This EUA will remain in effect (meaning this test can be used) for the duration of the COVID-19 declaration under Section 564(b)(1) of the Act, 21 U.S.C. section 360bbb-3(b)(1), unless the authorization is terminated or revoked.  Performed at [facility], [address] [place], [place] [address]    Time coordinating discharge: 35 minutes  SIGNED:  Kerney Elbe, DO [facility] 08/21/2021, 7:17 PM Pager is on [place]  If 7PM-7AM, please contact night-coverage [email or web address]

## 2021-08-21 NOTE — Care Management Important Message (Signed)
Important Message  Patient Details  Name: [name] MRN: [id number] Date of Birth: 1960/08/27   Medicare Important Message Given:  Yes     Juliann Pulse A Truly Stankiewicz 08/21/2021, 3:03 PM

## 2021-08-21 NOTE — Care Management Important Message (Signed)
Important Message  Patient Details  Name: [name] MRN: [id number] Date of Birth: 1961/07/09   Medicare Important Message Given:  Yes     [name] 08/21/2021, 3:24 PM

## 2021-08-21 NOTE — Progress Notes (Signed)
° °  Order for Zio AT has been placed. Respiratory is aware and will place today. RN has been sent message to not discharge the patient without Zio patch applied.

## 2021-08-21 NOTE — Plan of Care (Signed)
Neurology plan of care  Per cardiology, TEE showed no e/o intracardiac clot. Source of stroke could possibly be from severe aortic atheroma (heavily calcified ulcerative plaque in arch, descending aorta).  Final recommendations: - Patient was on ASA monotherapy prior to admission. Recommend ASA 81mg  daily + plavix 75mg  daily x21 days f/b plavix 75mg  daily monotherapy after that. - Atorvastatin 80mg  daily - 14 day Zio to be placed prior to discharge - I will arrange neuro f/u - OK to d/c from neuro standpoint  , MD [facility] [phone]  If 7pm- 7am, please page neurology on call as listed in AMION.

## 2021-08-23 ENCOUNTER — Ambulatory Visit: Payer: Medicare Other | Admitting: Cardiology

## 2021-09-10 NOTE — Addendum Note (Signed)
Encounter addended by: Oneida Arenas on: 09/10/2021 7:40 AM  Actions taken: Imaging Exam ended

## 2021-09-25 ENCOUNTER — Ambulatory Visit (INDEPENDENT_AMBULATORY_CARE_PROVIDER_SITE_OTHER): Payer: Medicare Other | Admitting: Obstetrics and Gynecology

## 2021-09-25 ENCOUNTER — Other Ambulatory Visit: Payer: Self-pay

## 2021-09-25 ENCOUNTER — Encounter: Payer: Self-pay | Admitting: Obstetrics and Gynecology

## 2021-09-25 VITALS — BP 193/89 | HR 74 | Ht 60.0 in | Wt 132.4 lb

## 2021-09-25 DIAGNOSIS — Z7689 Persons encountering health services in other specified circumstances: Secondary | ICD-10-CM

## 2021-09-25 DIAGNOSIS — N941 Unspecified dyspareunia: Secondary | ICD-10-CM

## 2021-09-25 DIAGNOSIS — N952 Postmenopausal atrophic vaginitis: Secondary | ICD-10-CM

## 2021-09-25 MED ORDER — ESTRADIOL 0.1 MG/GM VA CREA: 0.2500 | TOPICAL_CREAM | Freq: Every day | VAGINAL | 3 refills | Status: DC

## 2021-09-25 NOTE — Progress Notes (Signed)
HPI: ?     [name] is a 61 y.o. G3P3003 who LMP was No LMP recorded. Patient is postmenopausal. ? ?Subjective:  ? ?She presents today with complaint of a 51-month history of dyspareunia.  She reports that it occurs with deep penetration.  She has tried different lubricants without success.  It forces her to stop intercourse when the pain occurs.  She denies vaginal discharge or odor.  She denies new sexual partners and does not believe any type of vaginal cultures are necessary today. ?She is up-to-date on her Pap smear.  She reports her mammogram is due in November. ?She typically takes antihypertensive medication but did not take it today. ? ?  Hx: ?The following portions of the patient's history were reviewed and updated as appropriate: ?            She  has a past medical history of Anxiety, Chest pain, Depression, Diabetes mellitus without complication ([place]), Heart murmur, History of kidney stones, Hypertension, MI, old, Neck pain, and Stroke ([place]). ?She does not have any pertinent problems on file. ?She  has a past surgical history that includes Back surgery; Tubal ligation; LEFT HEART CATH AND CORONARY ANGIOGRAPHY (N/A, 09/11/2018); CAROTID PTA/STENT INTERVENTION (Right, 01/26/2020); and TEE without cardioversion (N/A, 08/21/2021). ?Her family history includes Cancer in her maternal aunt; Diabetes in her mother; Heart attack in her father; Heart disease in her mother; Heart disease (age of onset: 80) in her father; Hyperlipidemia in her brother; Hypertension in her father and mother; Stroke in her mother. ?She  reports that she quit smoking about 5 weeks ago. Her smoking use included cigarettes. She smoked an average of 1 pack per day. She has never used smokeless tobacco. She reports that she does not currently use alcohol. She reports that she does not currently use drugs after having used the following drugs: Marijuana. ?She has a current medication list which includes the following  prescription(s): amlodipine, aspirin, atorvastatin, clonazepam, clopidogrel, estradiol, gabapentin, hydrochlorothiazide, irbesartan, and nicotine. ?She has No Known Allergies. ?      ?Review of Systems:  ?Review of Systems ? ?Constitutional: Denied constitutional symptoms, night sweats, recent illness, fatigue, fever, insomnia and weight loss.  ?Eyes: Denied eye symptoms, eye pain, photophobia, vision change and visual disturbance.  ?Ears/Nose/Throat/Neck: Denied ear, nose, throat or neck symptoms, hearing loss, nasal discharge, sinus congestion and sore throat.  ?Cardiovascular: Denied cardiovascular symptoms, arrhythmia, chest pain/pressure, edema, exercise intolerance, orthopnea and palpitations.  ?Respiratory: Denied pulmonary symptoms, asthma, pleuritic pain, productive sputum, cough, dyspnea and wheezing.  ?Gastrointestinal: Denied, gastro-esophageal reflux, melena, nausea and vomiting.  ?Genitourinary: See HPI for additional information.  ?Musculoskeletal: Denied musculoskeletal symptoms, stiffness, swelling, muscle weakness and myalgia.  ?Dermatologic: Denied dermatology symptoms, rash and scar.  ?Neurologic: Denied neurology symptoms, dizziness, headache, neck pain and syncope.  ?Psychiatric: Denied psychiatric symptoms, anxiety and depression.  ?Endocrine: Denied endocrine symptoms including hot flashes and night sweats.  ? ?Meds: ?  ?Current Outpatient Medications on File Prior to Visit  ?Medication Sig Dispense Refill  ? amLODipine (NORVASC) 10 MG tablet Take 1 tablet (10 mg total) by mouth daily. 90 tablet 3  ? aspirin 81 MG chewable tablet Chew by mouth daily.    ? atorvastatin (LIPITOR) 80 MG tablet Take 1 tablet (80 mg total) by mouth daily. 30 tablet 0  ? clonazePAM (KLONOPIN) 0.5 MG tablet Take 0.5 mg by mouth daily as needed.    ? clopidogrel (PLAVIX) 75 MG tablet Take 1 tablet (75 mg total)  by mouth daily. 30 tablet 11  ? gabapentin (NEURONTIN) 300 MG capsule Take 1 capsule (300 mg total) by  mouth 3 (three) times daily as needed (nerve pain). 90 capsule 0  ? hydrochlorothiazide (HYDRODIURIL) 25 MG tablet Take 25 mg by mouth daily.    ? irbesartan (AVAPRO) 150 MG tablet Take 1 tablet (150 mg total) by mouth daily. 30 tablet 5  ? nicotine (NICODERM CQ - DOSED IN MG/24 HR) 7 mg/24hr patch Place 1 patch (7 mg total) onto the skin daily. 28 patch 0  ? ?No current facility-administered medications on file prior to visit.  ? ? ? ? ?Objective:  ?  ? ?Vitals:  ? 09/25/21 1005 09/25/21 1017  ?BP: (!) 212/96 (!) 193/89  ?Pulse: 76 74  ? ?Filed Weights  ? 09/25/21 1005  ?Weight: 132 lb 6.4 oz (60.1 kg)  ? ?  ?         Physical examination ?  Pelvic:   ?Vulva: Normal appearance.  No lesions.  ?Vagina: No lesions or abnormalities noted.  Significant vaginal atrophy noted  ?Support: Normal pelvic support.  ?Urethra No masses tenderness or scarring.  ?Meatus Normal size without lesions or prolapse.  ?Cervix: Normal appearance.  No lesions.  ?Anus: Normal exam.  No lesions.  ?Perineum: Normal exam.  No lesions.  ?      Bimanual   ?Uterus: Normal size.  Non-tender.  Mobile.  AV.  Some pain reproduced with motion of the cervix especially toward the right adnexa.  ?Adnexae: No masses.  Non-tender to palpation.  ?Cul-de-sac: Negative for abnormality.  ? ? ?        ? ?Assessment:  ?  ?G3P3003 ?Patient Active Problem List  ? Diagnosis Date Noted  ? 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 Symptomatic carotid artery narrowing without infarction 12/27/2019  ? CAD (coronary artery disease) 12/27/2019  ? History of fusion of cervical spine 03/04/2019  ? Neck pain 02/11/2019  ? Unstable angina (HCC)   ? HLD (hyperlipidemia) 09/09/2018  ? HTN (hypertension) 09/09/2018  ? Chest pain 09/09/2018  ? Mass of hand 12/12/2017  ? Painful intercourse 03/15/2013  ? At risk for diabetes mellitus 08/13/2012  ? Vertigo 06/26/2012  ? Overweight(278.02) 01/09/2012  ? Overweight 01/09/2012  ? Stroke ([place]) 04/12/2011  ? Tobacco  abuse 02/07/2011  ? Dyslipidemia 02/07/2011  ? ANXIETY 12/01/2009  ? Depression 12/01/2009  ? HYPERTENSION, BENIGN ESSENTIAL 12/01/2009  ? DIVERTICULOSIS OF COLON 12/01/2009  ? RENAL CYST 12/01/2009  ? BACK PAIN, LUMBAR, CHRONIC 12/01/2009  ? Diverticulosis of colon 12/01/2009  ? Anxiety state 12/01/2009  ? Renal cyst 12/01/2009  ? Benign essential hypertension 12/01/2009  ? Low back pain 12/01/2009  ? ?  ?1. Establishing care with new doctor, encounter for   ?2. Dyspareunia, female   ?3. Vaginal atrophy   ? ? Recent ultrasound shows no pelvic abnormalities. ? Vaginal atrophy could be the cause of her dyspareunia although it does not seem to be relieved with lubrication. ? ? ?Plan:  ?  ?       ? 1.  Plan Short trial of estrogen vaginal cream for atrophy.  If pain not improved.  Consider vaginal cultures. ?Orders ?No orders of the defined types were placed in this encounter. ? ?  ?Meds ordered this encounter  ?Medications  ? estradiol (ESTRACE) 0.1 MG/GM vaginal cream  ?  Sig: Place [id number] Applicatorfuls vaginally at bedtime.  ?  Dispense:  90 g  ?  Refill:  3  ?  ?  F/U ? Return in about 8 weeks (around 11/20/2021). ?I spent 32 minutes involved in the care of this patient preparing to see the patient by obtaining and reviewing her medical history (including labs, imaging tests and prior procedures), documenting clinical information in the electronic health record (EHR), counseling and coordinating care plans, writing and sending prescriptions, ordering tests or procedures and in direct communicating with the patient and medical staff discussing pertinent items from her history and physical exam. ? ?Finis Bud, M.D. ?09/25/2021 ?10:38 AM ? ? ? ? ?

## 2021-09-25 NOTE — Progress Notes (Signed)
Patient presents to establish care today. She reports painful intercourse for several months. Patient states PCP has recently preformed pap smear and ordered mammogram. Patient states no other questions or concerns at this time. ?

## 2021-10-01 ENCOUNTER — Telehealth: Payer: Self-pay | Admitting: Obstetrics and Gynecology

## 2021-10-01 DIAGNOSIS — N952 Postmenopausal atrophic vaginitis: Secondary | ICD-10-CM

## 2021-10-01 DIAGNOSIS — N941 Unspecified dyspareunia: Secondary | ICD-10-CM

## 2021-10-01 MED ORDER — ESTRADIOL 0.1 MG/GM VA CREA: 0.2500 | TOPICAL_CREAM | Freq: Every day | VAGINAL | 3 refills | Status: AC

## 2021-10-01 NOTE — Telephone Encounter (Signed)
Patient is needing to speak with someone about the meds that she was prescribed. She is not able to afford them.  ? ?CB# [phone] ?

## 2021-10-01 NOTE — Telephone Encounter (Signed)
Spoke with patient. She stated her concern was the cost of her medication. Advised patient about [facility] and discuss other locations that might be beneficial to her. Patient agreed to try [facility] along with [facility]. Prescription has been resent, patient voiced understanding.  ?

## 2021-11-28 ENCOUNTER — Encounter: Payer: Medicare Other | Admitting: Obstetrics and Gynecology

## 2021-12-12 ENCOUNTER — Encounter: Payer: Medicare Other | Admitting: Obstetrics and Gynecology

## 2021-12-12 DIAGNOSIS — N952 Postmenopausal atrophic vaginitis: Secondary | ICD-10-CM

## 2021-12-12 DIAGNOSIS — N941 Unspecified dyspareunia: Secondary | ICD-10-CM

## 2022-01-29 ENCOUNTER — Encounter: Payer: Self-pay | Admitting: Obstetrics and Gynecology

## 2022-01-29 ENCOUNTER — Ambulatory Visit (INDEPENDENT_AMBULATORY_CARE_PROVIDER_SITE_OTHER): Payer: Medicare Other | Admitting: Obstetrics and Gynecology

## 2022-01-29 VITALS — BP 220/90 | HR 64 | Ht 60.0 in | Wt 132.0 lb

## 2022-01-29 DIAGNOSIS — N941 Unspecified dyspareunia: Secondary | ICD-10-CM | POA: Diagnosis not present

## 2022-01-29 DIAGNOSIS — N952 Postmenopausal atrophic vaginitis: Secondary | ICD-10-CM

## 2022-01-29 NOTE — Progress Notes (Signed)
HPI:      [name] is a 60 y.o. [phone] who LMP was No LMP recorded. Patient is postmenopausal.  Subjective:   She presents today for follow-up of her vaginal pain especially with intercourse.  She reports that her pain is gone.  But her expected return of sexual desire has not materialized.  She says that she "does not care about this".  She would like to continue the estrogen cream. She is followed by Dr. Truett Daniels for her hypertension.    Hx: The following portions of the patient's history were reviewed and updated as appropriate:             She  has a past medical history of Anxiety, Chest pain, Depression, Diabetes mellitus without complication (HCC), Heart murmur, History of kidney stones, Hypertension, MI, old, Neck pain, and Stroke (HCC). She does not have any pertinent problems on file. She  has a past surgical history that includes Back surgery; Tubal ligation; LEFT HEART CATH AND CORONARY ANGIOGRAPHY (N/A, 09/11/2018); CAROTID PTA/STENT INTERVENTION (Right, 01/26/2020); and TEE without cardioversion (N/A, 08/21/2021). Her family history includes Cancer in her maternal aunt; Diabetes in her mother; Heart attack in her father; Heart disease in her mother; Heart disease (age of onset: 71) in her father; Hyperlipidemia in her brother; Hypertension in her father and mother; Stroke in her mother. She  reports that she quit smoking about 5 months ago. Her smoking use included cigarettes. She smoked an average of 1 pack per day. She has never used smokeless tobacco. She reports that she does not currently use alcohol. She reports that she does not currently use drugs after having used the following drugs: Marijuana. She has a current medication list which includes the following prescription(s): amlodipine, aspirin, atorvastatin, clonazepam, clopidogrel, estradiol, gabapentin, hydrochlorothiazide, irbesartan, and nicotine. She has No Known Allergies.       Review of Systems:  Review of  Systems  Constitutional: Denied constitutional symptoms, night sweats, recent illness, fatigue, fever, insomnia and weight loss.  Eyes: Denied eye symptoms, eye pain, photophobia, vision change and visual disturbance.  Ears/Nose/Throat/Neck: Denied ear, nose, throat or neck symptoms, hearing loss, nasal discharge, sinus congestion and sore throat.  Cardiovascular: Denied cardiovascular symptoms, arrhythmia, chest pain/pressure, edema, exercise intolerance, orthopnea and palpitations.  Respiratory: Denied pulmonary symptoms, asthma, pleuritic pain, productive sputum, cough, dyspnea and wheezing.  Gastrointestinal: Denied, gastro-esophageal reflux, melena, nausea and vomiting.  Genitourinary: See HPI for additional information.  Musculoskeletal: Denied musculoskeletal symptoms, stiffness, swelling, muscle weakness and myalgia.  Dermatologic: Denied dermatology symptoms, rash and scar.  Neurologic: Denied neurology symptoms, dizziness, headache, neck pain and syncope.  Psychiatric: Denied psychiatric symptoms, anxiety and depression.  Endocrine: Denied endocrine symptoms including hot flashes and night sweats.   Meds:   Current Outpatient Medications on File Prior to Visit  Medication Sig Dispense Refill   amLODipine (NORVASC) 10 MG tablet Take 1 tablet (10 mg total) by mouth daily. 90 tablet 3   aspirin 81 MG chewable tablet Chew by mouth daily.     atorvastatin (LIPITOR) 80 MG tablet Take 1 tablet (80 mg total) by mouth daily. 30 tablet 0   clonazePAM (KLONOPIN) 0.5 MG tablet Take 0.5 mg by mouth daily as needed.     clopidogrel (PLAVIX) 75 MG tablet Take 1 tablet (75 mg total) by mouth daily. 30 tablet 11   estradiol (ESTRACE) 0.1 MG/GM vaginal cream Place 0.25 Applicatorfuls vaginally at bedtime. 90 g 3   gabapentin (NEURONTIN) 300 MG capsule Take 1  capsule (300 mg total) by mouth 3 (three) times daily as needed (nerve pain). 90 capsule 0   hydrochlorothiazide (HYDRODIURIL) 25 MG tablet  Take 25 mg by mouth daily.     irbesartan (AVAPRO) 150 MG tablet Take 1 tablet (150 mg total) by mouth daily. 30 tablet 5   nicotine (NICODERM CQ - DOSED IN MG/24 HR) 7 mg/24hr patch Place 1 patch (7 mg total) onto the skin daily. (Patient not taking: Reported on 01/29/2022) 28 patch 0   No current facility-administered medications on file prior to visit.      Objective:     Vitals:   01/29/22 1000 01/29/22 1006  BP: (!) 221/92 (!) 220/90  Pulse:     Filed Weights   01/29/22 0958  Weight: 132 lb (59.9 kg)                        Assessment:    [id number] Patient Active Problem List   Diagnosis Date Noted   Central retinal artery occlusion of right eye 08/17/2021   Symptomatic carotid artery narrowing without infarction 12/27/2019   CAD (coronary artery disease) 12/27/2019   History of fusion of cervical spine 03/04/2019   Neck pain 02/11/2019   Unstable angina (HCC)    HLD (hyperlipidemia) 09/09/2018   HTN (hypertension) 09/09/2018   Chest pain 09/09/2018   Mass of hand 12/12/2017   Painful intercourse 03/15/2013   At risk for diabetes mellitus 08/13/2012   Vertigo 06/26/2012   Overweight(278.02) 01/09/2012   Overweight 01/09/2012   Stroke (HCC) 04/12/2011   Tobacco abuse 02/07/2011   Dyslipidemia 02/07/2011   ANXIETY 12/01/2009   Depression 12/01/2009   HYPERTENSION, BENIGN ESSENTIAL 12/01/2009   DIVERTICULOSIS OF COLON 12/01/2009   RENAL CYST 12/01/2009   BACK PAIN, LUMBAR, CHRONIC 12/01/2009   Diverticulosis of colon 12/01/2009   Anxiety state 12/01/2009   Renal cyst 12/01/2009   Benign essential hypertension 12/01/2009   Low back pain 12/01/2009     1. Dyspareunia, female   2. Vaginal atrophy     Patient's pain is now resolved using estrogen cream.  Has no sexual desire but she is not concerned with this and does not desire any attempt at treatment.   Significant increased blood pressure today.   Plan:            1.  Continue vaginal estrogen  cream twice weekly  2.  Patient to contact Dr. Kalman Daniels office today for blood pressure management issues-possible change in medication or dose as needed. Orders No orders of the defined types were placed in this encounter.   No orders of the defined types were placed in this encounter.     F/U  Return for Annual Physical. I spent 21 minutes involved in the care of this patient preparing to see the patient by obtaining and reviewing her medical history (including labs, imaging tests and prior procedures), documenting clinical information in the electronic health record (EHR), counseling and coordinating care plans, writing and sending prescriptions, ordering tests or procedures and in direct communicating with the patient and medical staff discussing pertinent items from her history and physical exam.  Elonda Husky, M.D. 01/29/2022 10:38 AM

## 2022-05-20 ENCOUNTER — Encounter (INDEPENDENT_AMBULATORY_CARE_PROVIDER_SITE_OTHER): Payer: Self-pay

## 2022-08-29 ENCOUNTER — Other Ambulatory Visit: Payer: Self-pay

## 2022-09-02 ENCOUNTER — Telehealth: Payer: Self-pay | Admitting: Pharmacy Technician

## 2022-09-02 NOTE — Telephone Encounter (Addendum)
Leqvio:  Medicare will cover 80% and patient will be responsible for remaining 20%.  [facility] has been notified patient will need assistance. [facility]: APPROVED Phone: [phone]

## 2022-10-04 ENCOUNTER — Encounter: Payer: Self-pay | Admitting: Pulmonary Disease

## 2022-11-28 ENCOUNTER — Ambulatory Visit (INDEPENDENT_AMBULATORY_CARE_PROVIDER_SITE_OTHER): Payer: Medicare Other

## 2022-11-28 VITALS — BP 166/70 | HR 57 | Temp 97.5°F | Resp 18 | Ht 60.0 in | Wt 119.2 lb

## 2022-11-28 DIAGNOSIS — E785 Hyperlipidemia, unspecified: Secondary | ICD-10-CM

## 2022-11-28 MED ORDER — INCLISIRAN SODIUM 284 MG/1.5ML ~~LOC~~ SOSY
284.0000 mg | PREFILLED_SYRINGE | Freq: Once | SUBCUTANEOUS | Status: AC
  Administered 2022-11-28: 284 mg via SUBCUTANEOUS
  Filled 2022-11-28 (×2): qty 1.5

## 2022-11-28 NOTE — Progress Notes (Signed)
Diagnosis: Hyperlipidemia  Provider:  Mannam, Praveen MD  Procedure: Injection  Leqvio (inclisiran), Dose: 284 mg, Site: subcutaneous, Number of injections: 1  Post Care: Observation period completed  Discharge: Condition: Good, Destination: Home . AVS Provided  Performed by:  Tyresa Prindiville, RN       

## 2022-11-28 NOTE — Patient Instructions (Signed)
Inclisiran Injection What is this medication? INCLISIRAN (in kli SIR an) treats high cholesterol. It works by decreasing bad cholesterol (such as LDL) in your blood. Changes to diet and exercise are often combined with this medication. This medicine may be used for other purposes; ask your health care provider or pharmacist if you have questions. COMMON BRAND NAME(S): LEQVIO What should I tell my care team before I take this medication? They need to know if you have any of these conditions: An unusual or allergic reaction to inclisiran, other medications, foods, dyes, or preservatives Pregnant or trying to get pregnant Breast-feeding How should I use this medication? This medication is injected under the skin. It is given by your care team in a hospital or clinic setting. Talk to your care team about the use of this medication in children. Special care may be needed. Overdosage: If you think you have taken too much of this medicine contact a poison control center or emergency room at once. NOTE: This medicine is only for you. Do not share this medicine with others. What if I miss a dose? Keep appointments for follow-up doses. It is important not to miss your dose. Call your care team if you are unable to keep an appointment. What may interact with this medication? Interactions are not expected. This list may not describe all possible interactions. Give your health care provider a list of all the medicines, herbs, non-prescription drugs, or dietary supplements you use. Also tell them if you smoke, drink alcohol, or use illegal drugs. Some items may interact with your medicine. What should I watch for while using this medication? Visit your care team for regular checks on your progress. Tell your care team if your symptoms do not start to get better or if they get worse. You may need blood work while you are taking this medication. What side effects may I notice from receiving this  medication? Side effects that you should report to your care team as soon as possible: Allergic reactions--skin rash, itching, hives, swelling of the face, lips, tongue, or throat Side effects that usually do not require medical attention (report these to your care team if they continue or are bothersome): Joint pain Pain, redness, or irritation at injection site This list may not describe all possible side effects. Call your doctor for medical advice about side effects. You may report side effects to FDA at 1-800-FDA-1088. Where should I keep my medication? This medication is given in a hospital or clinic. It will not be stored at home. NOTE: This sheet is a summary. It may not cover all possible information. If you have questions about this medicine, talk to your doctor, pharmacist, or health care provider.  2023 Elsevier/Gold Standard (2020-07-26 00:00:00)  

## 2023-02-05 ENCOUNTER — Ambulatory Visit (INDEPENDENT_AMBULATORY_CARE_PROVIDER_SITE_OTHER): Payer: Medicare Other

## 2023-02-05 ENCOUNTER — Other Ambulatory Visit: Payer: Self-pay | Admitting: Vascular Surgery

## 2023-02-05 DIAGNOSIS — M79605 Pain in left leg: Secondary | ICD-10-CM | POA: Diagnosis not present

## 2023-02-05 DIAGNOSIS — M79604 Pain in right leg: Secondary | ICD-10-CM

## 2023-02-06 ENCOUNTER — Ambulatory Visit (INDEPENDENT_AMBULATORY_CARE_PROVIDER_SITE_OTHER): Payer: Medicare Other | Admitting: Vascular Surgery

## 2023-02-06 ENCOUNTER — Encounter (INDEPENDENT_AMBULATORY_CARE_PROVIDER_SITE_OTHER): Payer: Self-pay | Admitting: Vascular Surgery

## 2023-02-06 VITALS — BP 113/64 | HR 89 | Resp 17 | Ht 60.0 in | Wt 119.2 lb

## 2023-02-06 DIAGNOSIS — M79605 Pain in left leg: Secondary | ICD-10-CM

## 2023-02-06 DIAGNOSIS — I6523 Occlusion and stenosis of bilateral carotid arteries: Secondary | ICD-10-CM | POA: Diagnosis not present

## 2023-02-06 DIAGNOSIS — I10 Essential (primary) hypertension: Secondary | ICD-10-CM

## 2023-02-06 DIAGNOSIS — I2510 Atherosclerotic heart disease of native coronary artery without angina pectoris: Secondary | ICD-10-CM

## 2023-02-06 DIAGNOSIS — I70213 Atherosclerosis of native arteries of extremities with intermittent claudication, bilateral legs: Secondary | ICD-10-CM

## 2023-02-12 ENCOUNTER — Encounter (INDEPENDENT_AMBULATORY_CARE_PROVIDER_SITE_OTHER): Payer: Self-pay | Admitting: Vascular Surgery

## 2023-02-12 DIAGNOSIS — M79606 Pain in leg, unspecified: Secondary | ICD-10-CM | POA: Insufficient documentation

## 2023-02-12 DIAGNOSIS — I70219 Atherosclerosis of native arteries of extremities with intermittent claudication, unspecified extremity: Secondary | ICD-10-CM | POA: Insufficient documentation

## 2023-02-12 NOTE — Progress Notes (Signed)
MRN : [id number]  [name] is a 62 y.o. (April 16, 1961) female who presents with chief complaint of legs hurt and swell.  History of Present Illness:  The patient returns to the office for evaluation regarding left pain and leg swelling.  The swelling has persisted and the pain associated with swelling continues. There have not been any interval development of a ulcerations or wounds.  Since the previous visit the patient has been wearing graduated compression stockings and has noted little if any improvement in the lymphedema. The patient has been using compression routinely morning until night.  The patient also states elevation during the day and exercise is being done too.  Duplex ultrasound of the left leg venous system obtained February 05, 2023 is negative for DVT.  Deep venous system is fully compressible on the left.  No evidence of reflux is identified.  Current Meds  Medication Sig   aspirin 81 MG chewable tablet Chew by mouth daily.   atorvastatin (LIPITOR) 80 MG tablet Take 1 tablet (80 mg total) by mouth daily.   clonazePAM (KLONOPIN) 0.5 MG tablet Take 0.5 mg by mouth daily as needed.   ezetimibe (ZETIA) 10 MG tablet Take 1 tablet by mouth daily.   famotidine (PEPCID) 20 MG tablet Take by mouth.   irbesartan (AVAPRO) 150 MG tablet Take 1 tablet (150 mg total) by mouth daily.   loratadine (CLARITIN) 10 MG tablet Take 1 tablet by mouth daily.   metFORMIN (GLUCOPHAGE) 500 MG tablet Take 500 mg by mouth 2 (two) times daily.   Olmesartan-amLODIPine-HCTZ 40-10-25 MG TABS Take 1 tablet by mouth daily.   ondansetron (ZOFRAN-ODT) 4 MG disintegrating tablet PLACE ONE TABLET ON THE TONGUE EVERY 6 HOURS AS NEEDED   sertraline (ZOLOFT) 25 MG tablet Take 25 mg by mouth daily.    Past Medical History:  Diagnosis Date   Anxiety    Chest pain    Depression    Diabetes mellitus without complication (HCC)    Heart murmur    Normal echo 2007   History of kidney stones     Hypertension    MI, old    Negative nuclear stress test 2007   Neck pain    Stroke [facility])    mini stroke    Past Surgical History:  Procedure Laterality Date   BACK SURGERY     x4, L4-5   CAROTID PTA/STENT INTERVENTION Right 01/26/2020   Procedure: CAROTID PTA/STENT INTERVENTION;  Surgeon: Renford Dills, MD;  Location: [facility] INVASIVE CV LAB;  Service: Cardiovascular;  Laterality: Right;   LEFT HEART CATH AND CORONARY ANGIOGRAPHY N/A 09/11/2018   Procedure: LEFT HEART CATH AND CORONARY ANGIOGRAPHY;  Surgeon: Lyn Records, MD;  Location: [facility] INVASIVE CV LAB;  Service: Cardiovascular;  Laterality: N/A;   TEE WITHOUT CARDIOVERSION N/A 08/21/2021   Procedure: TRANSESOPHAGEAL ECHOCARDIOGRAM (TEE);  Surgeon: Antonieta Iba, MD;  Location: [facility] ORS;  Service: Cardiovascular;  Laterality: N/A;   TUBAL LIGATION      Social History Social History   Tobacco Use   Smoking status: Former    Current packs/day: 0.00    Types: Cigarettes    Quit date: 08/21/2021    Years since quitting: 1.4   Smokeless tobacco: Never  Vaping Use   Vaping status: Never Used  Substance Use Topics   Alcohol use: Not Currently    Comment: occasionally   Drug use: Not Currently    Types: Marijuana    Family  History Family History  Problem Relation Age of Onset   Kidney disease Mother    Heart disease Mother    Stroke Mother    Hypertension Mother    Diabetes Mother    Heart disease Father 103       Died age 93   Heart attack Father    Hypertension Father    Hyperlipidemia Brother    Cancer Maternal Aunt     No Known Allergies   REVIEW OF SYSTEMS (Negative unless checked)  Constitutional: [] Weight loss  [] Fever  [] Chills Cardiac: [] Chest pain   [] Chest pressure   [] Palpitations   [] Shortness of breath when laying flat   [] Shortness of breath with exertion. Vascular:  [] Pain in legs with walking   [x] Pain in legs at rest  [] History of DVT   [] Phlebitis   [x] Swelling in legs   [] Varicose veins    [] Non-healing ulcers Pulmonary:   [] Uses home oxygen   [] Productive cough   [] Hemoptysis   [] Wheeze  [] COPD   [] Asthma Neurologic:  [] Dizziness   [] Seizures   [] History of stroke   [] History of TIA  [] Aphasia   [] Vissual changes   [] Weakness or numbness in arm   [] Weakness or numbness in leg Musculoskeletal:   [] Joint swelling   [] Joint pain   [] Low back pain Hematologic:  [] Easy bruising  [] Easy bleeding   [] Hypercoagulable state   [] Anemic Gastrointestinal:  [] Diarrhea   [] Vomiting  [] Gastroesophageal reflux/heartburn   [] Difficulty swallowing. Genitourinary:  [] Chronic kidney disease   [] Difficult urination  [] Frequent urination   [] Blood in urine Skin:  [] Rashes   [] Ulcers  Psychological:  [] History of anxiety   []  History of major depression.  Physical Examination  Vitals:   02/06/23 1404  BP: 113/64  Pulse: 89  Resp: 17  Weight: 119 lb 3.2 oz (54.1 kg)  Height: 5' (1.524 m)   Body mass index is 23.28 kg/m. Gen: WD/WN, NAD Head: [place]/AT, No temporalis wasting.  Ear/Nose/Throat: Hearing grossly intact, nares w/o erythema or drainage, pinna without lesions Eyes: PER, EOMI, sclera nonicteric.  Neck: Supple, no gross masses.  No JVD.  Pulmonary:  Good air movement, no audible wheezing, no use of accessory muscles.  Cardiac: RRR, precordium not hyperdynamic. Vascular:  scattered varicosities present bilaterally.  Moderate venous stasis changes to the legs bilaterally.  2+ soft pitting edema. CEAP [id number]   Vessel Right Left  Radial Palpable Palpable  Gastrointestinal: soft, non-distended. No guarding/no peritoneal signs.  Musculoskeletal: M/S 5/5 throughout.  No deformity.  Neurologic: CN 2-12 intact. Pain and light touch intact in extremities.  Symmetrical.  Speech is fluent. Motor exam as listed above. Psychiatric: Judgment intact, Mood & affect appropriate for pt's clinical situation. Dermatologic: Venous rashes no ulcers noted.  No changes consistent with cellulitis. Lymph :  No lichenification or skin changes of chronic lymphedema.  CBC Lab Results  Component Value Date   WBC 6.5 08/21/2021   HGB 15.1 (H) 08/21/2021   HCT 45.3 08/21/2021   MCV 88.8 08/21/2021   PLT 200 08/21/2021    BMET    Component Value Date/Time   NA 139 08/21/2021 0620   NA 138 05/25/2013 1715   K 3.0 (L) 08/21/2021 0620   K 3.4 (L) 05/25/2013 1715   CL 102 08/21/2021 0620   CL 105 05/25/2013 1715   CO2 28 08/21/2021 0620   CO2 30 05/25/2013 1715   GLUCOSE 150 (H) 08/21/2021 0620   GLUCOSE 119 (H) 05/25/2013 1715   BUN 20 08/21/2021  0620   BUN 13 05/25/2013 1715   CREATININE 0.50 08/21/2021 0620   CREATININE 0.63 05/25/2013 1715   CREATININE 0.59 08/19/2011 1206   CALCIUM 9.6 08/21/2021 0620   CALCIUM 9.8 05/25/2013 1715   GFRNONAA >60 08/21/2021 0620   GFRNONAA >60 05/25/2013 1715   GFRAA >60 01/27/2020 0703   GFRAA >60 05/25/2013 1715   CrCl cannot be calculated (Patient's most recent lab result is older than the maximum 21 days allowed.).  COAG Lab Results  Component Value Date   INR 1.0 08/17/2021   INR  06/15/2009    1.00 DISREGARD  NOTIFIED JENNIFER AT 1200 ON 112510 BY HOOKER,B   INR 1.0 02/19/2008    Radiology No results found.   Assessment/Plan 1. Pain of left lower extremity  Recommend:  The patient does not have evidence for acute deep venous thrombosis.  Venous duplex yesterday is normal.  She has evidence of mild atherosclerosis of the lower extremities with claudication.  The patient does not voice lifestyle limiting changes at this point in time.  Noninvasive studies do not suggest clinically significant change.  No invasive studies, angiography or surgery at this time The patient should continue walking and begin a more formal exercise program.  The patient should continue antiplatelet therapy and aggressive treatment of the lipid abnormalities  No changes in the patient's medications at this time  Continued surveillance is indicated  as atherosclerosis is likely to progress with time.    The patient will continue follow up with noninvasive studies as ordered.  - VAS [place] ABI WITH/WO TBI; Future  2. Symptomatic stenosis of both carotid arteries without infarction Recommend:  Given the patient's asymptomatic subcritical stenosis no further invasive testing or surgery at this time.  Continue antiplatelet therapy as prescribed Continue management of CAD, HTN and Hyperlipidemia Healthy heart diet,  encouraged exercise at least 4 times per week Follow up in 6 months with duplex ultrasound and physical exam  - VAS US CAROTID; Future  3. Atherosclerosis of native artery of both lower extremities with intermittent claudication (HCC) Recommend:  The patient does not have evidence for acute deep venous thrombosis.  Venous duplex yesterday is normal.  She has evidence of mild atherosclerosis of the lower extremities with claudication.  The patient does not voice lifestyle limiting changes at this point in time.  Noninvasive studies do not suggest clinically significant change.  No invasive studies, angiography or surgery at this time The patient should continue walking and begin a more formal exercise program.  The patient should continue antiplatelet therapy and aggressive treatment of the lipid abnormalities  No changes in the patient's medications at this time  Continued surveillance is indicated as atherosclerosis is likely to progress with time.    The patient will continue follow up with noninvasive studies as ordered.  - VAS [place] ABI WITH/WO TBI; Future  4. Primary hypertension Continue antihypertensive medications as already ordered, these medications have been reviewed and there are no changes at this time.  5. Coronary artery disease involving native coronary artery of native heart without angina pectoris Continue cardiac and antihypertensive medications as already ordered and reviewed, no changes at this  time.  Continue statin as ordered and reviewed, no changes at this time  Nitrates PRN for chest pain    Levora Dredge, MD  02/12/2023 10:39 AM

## 2023-03-03 ENCOUNTER — Ambulatory Visit (INDEPENDENT_AMBULATORY_CARE_PROVIDER_SITE_OTHER): Payer: Medicare Other

## 2023-03-03 VITALS — BP 145/68 | HR 69 | Temp 97.5°F | Resp 18 | Ht 60.0 in | Wt 117.8 lb

## 2023-03-03 DIAGNOSIS — E785 Hyperlipidemia, unspecified: Secondary | ICD-10-CM

## 2023-03-03 MED ORDER — INCLISIRAN SODIUM 284 MG/1.5ML ~~LOC~~ SOSY
284.0000 mg | PREFILLED_SYRINGE | Freq: Once | SUBCUTANEOUS | Status: AC
  Administered 2023-03-03: 284 mg via SUBCUTANEOUS
  Filled 2023-03-03: qty 1.5

## 2023-03-03 NOTE — Progress Notes (Signed)
Diagnosis: Hyperlipidemia  Provider:  Chilton Greathouse MD  Procedure: Injection  Leqvio (inclisiran), Dose: 284 mg, Site: subcutaneous, Number of injections: 1  Administered in abdomen.  Post Care: Patient declined observation  Discharge: Condition: Good, Destination: Home . AVS Provided  Performed by:  Wyvonne Lenz, RN

## 2023-08-09 NOTE — Progress Notes (Deleted)
MRN : [id number]  [name] is a 63 y.o. (July 15, 1961) female who presents with chief complaint of legs swell.  History of Present Illness:   The patient returns to the office for followup regarding atherosclerotic changes of the lower extremities and review of the noninvasive studies.   There have been no interval changes in lower extremity symptoms. No interval shortening of the patient's claudication distance or development of rest pain symptoms. No new ulcers or wounds have occurred since the last visit.  There have been no significant changes to the patient's overall health care.  The patient denies amaurosis fugax or recent TIA symptoms. There are no documented recent neurological changes noted. There is no history of DVT, PE or superficial thrombophlebitis. The patient denies recent episodes of angina or shortness of breath.   ABI Rt=*** and Lt=***  (previous ABI's Rt=*** and Lt=***) Duplex ultrasound of the ***  Duplex ultrasound of the carotid arteries demonstrates RICA 50-69% and LICA 40-59%.  (Previous carotid duplex RICA 50-69% and LICA 40-59%)  Previous duplex ultrasound of the left leg venous system obtained February 05, 2023 is negative for DVT. Deep venous system is fully compressible on the left. No evidence of reflux is identified.   No outpatient medications have been marked as taking for the 08/11/23 encounter (Appointment) with Gilda Crease, Latina Craver, MD.    Past Medical History:  Diagnosis Date   Anxiety    Chest pain    Depression    Diabetes mellitus without complication (HCC)    Heart murmur    Normal echo 2007   History of kidney stones    Hypertension    MI, old    Negative nuclear stress test 2007   Neck pain    Stroke [facility])    mini stroke    Past Surgical History:  Procedure Laterality Date   BACK SURGERY     x4, L4-5   CAROTID PTA/STENT INTERVENTION Right 01/26/2020   Procedure: CAROTID PTA/STENT INTERVENTION;   Surgeon: Renford Dills, MD;  Location: [facility] INVASIVE CV LAB;  Service: Cardiovascular;  Laterality: Right;   LEFT HEART CATH AND CORONARY ANGIOGRAPHY N/A 09/11/2018   Procedure: LEFT HEART CATH AND CORONARY ANGIOGRAPHY;  Surgeon: Lyn Records, MD;  Location: [facility] INV[facility]IVE CV [facility];  Service: Cardiovascular;  Laterality: N/A;   TEE WITHOUT CARDIOVERSION N/A 08/21/2021   Procedure: TRANSESOPHAGEAL ECHOCARDIOGRAM (TEE);  Surgeon: Antonieta Iba, MD;  Location: [facility] ORS;  Service: Cardiovascular;  Laterality: N/A;   TUBAL LIGATION      Social History Social History   Tobacco Use   Smoking status: Former    Current packs/day: 0.00    Types: Cigarettes    Quit date: 08/21/2021    Years since quitting: 1.9   Smokeless tobacco: Never  Vaping Use   Vaping status: Never Used  Substance Use Topics   Alcohol use: Not Currently    Comment: occasionally   Drug use: Not Currently    Types: Marijuana    Family History Family History  Problem Relation Age of Onset   Kidney disease Mother    Heart disease Mother    Stroke Mother    Hypertension Mother    Diabetes Mother    Heart disease Father 34       Died age 49   Heart attack Father  Hypertension Father    Hyperlipidemia Brother    Cancer Maternal Aunt     No Known Allergies   REVIEW OF SYSTEMS (Negative unless checked)  Constitutional: [] Weight loss  [] Fever  [] Chills Cardiac: [] Chest pain   [] Chest pressure   [] Palpitations   [] Shortness of breath when laying flat   [] Shortness of breath with exertion. Vascular:  [] Pain in legs with walking   [x] Pain in legs with standing  [] History of DVT   [] Phlebitis   [x] Swelling in legs   [] Varicose veins   [] Non-healing ulcers Pulmonary:   [] Uses home oxygen   [] Productive cough   [] Hemoptysis   [] Wheeze  [] COPD   [] Asthma Neurologic:  [] Dizziness   [] Seizures   [] History of stroke   [] History of TIA  [] Aphasia   [] Vissual changes   [] Weakness or numbness in arm   [] Weakness or  numbness in leg Musculoskeletal:   [] Joint swelling   [] Joint pain   [] Low back pain Hematologic:  [] Easy bruising  [] Easy bleeding   [] Hypercoagulable state   [] Anemic Gastrointestinal:  [] Diarrhea   [] Vomiting  [] Gastroesophageal reflux/heartburn   [] Difficulty swallowing. Genitourinary:  [] Chronic kidney disease   [] Difficult urination  [] Frequent urination   [] Blood in urine Skin:  [] Rashes   [] Ulcers  Psychological:  [] History of anxiety   []  History of major depression.  Physical Examination  There were no vitals filed for this visit. There is no height or weight on file to calculate BMI. Gen: WD/WN, NAD Head: [place]/AT, No temporalis wasting.  Ear/Nose/Throat: Hearing grossly intact, nares w/o erythema or drainage, pinna without lesions Eyes: PER, EOMI, sclera nonicteric.  Neck: Supple, no gross masses.  No JVD.  Pulmonary:  Good air movement, no audible wheezing, no use of accessory muscles.  Cardiac: RRR, precordium not hyperdynamic. Vascular:  scattered varicosities present bilaterally.  Mild venous stasis changes to the legs bilaterally.  3-4+ soft pitting edema, CEAP [id number]  Vessel Right Left  Radial Palpable Palpable  Gastrointestinal: soft, non-distended. No guarding/no peritoneal signs.  Musculoskeletal: M/S 5/5 throughout.  No deformity.  Neurologic: CN 2-12 intact. Pain and light touch intact in extremities.  Symmetrical.  Speech is fluent. Motor exam as listed above. Psychiatric: Judgment intact, Mood & affect appropriate for pt's clinical situation. Dermatologic: Venous rashes no ulcers noted.  No changes consistent with cellulitis. Lymph : No lichenification or skin changes of chronic lymphedema.  CBC Lab Results  Component Value Date   WBC 6.5 08/21/2021   HGB 15.1 (H) 08/21/2021   HCT 45.3 08/21/2021   MCV 88.8 08/21/2021   PLT 200 08/21/2021    BMET    Component Value Date/Time   NA 139 08/21/2021 0620   NA 138 05/25/2013 1715   K 3.0 (L) 08/21/2021  0620   K 3.4 (L) 05/25/2013 1715   CL 102 08/21/2021 0620   CL 105 05/25/2013 1715   CO2 28 08/21/2021 0620   CO2 30 05/25/2013 1715   GLUCOSE 150 (H) 08/21/2021 0620   GLUCOSE 119 (H) 05/25/2013 1715   BUN 20 08/21/2021 0620   BUN 13 05/25/2013 1715   CREATININE 0.50 08/21/2021 0620   CREATININE 0.63 05/25/2013 1715   CREATININE 0.59 08/19/2011 1206   CALCIUM 9.6 08/21/2021 0620   CALCIUM 9.8 05/25/2013 1715   GFRNONAA >60 08/21/2021 0620   GFRNONAA >60 05/25/2013 1715   GFRAA >60 01/27/2020 0703   GFRAA >60 05/25/2013 1715   CrCl cannot be calculated (Patient's most recent lab result is older than the  maximum 21 days allowed.).  COAG Lab Results  Component Value Date   INR 1.0 08/17/2021   INR  06/15/2009    1.00 DISREGARD  NOTIFIED JENNIFER AT 1200 ON 112510 BY HOOKER,B   INR 1.0 02/19/2008    Radiology No results found.   Assessment/Plan There are no diagnoses linked to this encounter.   Levora Dredge, MD  08/09/2023 3:00 PM

## 2023-08-11 ENCOUNTER — Ambulatory Visit (INDEPENDENT_AMBULATORY_CARE_PROVIDER_SITE_OTHER): Payer: Medicare Other | Admitting: Vascular Surgery

## 2023-08-11 ENCOUNTER — Encounter (INDEPENDENT_AMBULATORY_CARE_PROVIDER_SITE_OTHER): Payer: Self-pay

## 2023-08-11 ENCOUNTER — Encounter (INDEPENDENT_AMBULATORY_CARE_PROVIDER_SITE_OTHER): Payer: Medicare Other

## 2023-08-11 DIAGNOSIS — I70213 Atherosclerosis of native arteries of extremities with intermittent claudication, bilateral legs: Secondary | ICD-10-CM

## 2023-08-11 DIAGNOSIS — I2510 Atherosclerotic heart disease of native coronary artery without angina pectoris: Secondary | ICD-10-CM

## 2023-08-11 DIAGNOSIS — E785 Hyperlipidemia, unspecified: Secondary | ICD-10-CM

## 2023-08-11 DIAGNOSIS — I6523 Occlusion and stenosis of bilateral carotid arteries: Secondary | ICD-10-CM

## 2023-08-11 DIAGNOSIS — I10 Essential (primary) hypertension: Secondary | ICD-10-CM

## 2023-09-03 ENCOUNTER — Telehealth: Payer: Self-pay

## 2023-09-03 ENCOUNTER — Ambulatory Visit: Payer: Medicare Other

## 2023-09-03 MED ORDER — INCLISIRAN SODIUM 284 MG/1.5ML ~~LOC~~ SOSY: 284.0000 mg | PREFILLED_SYRINGE | Freq: Once | SUBCUTANEOUS | Status: DC

## 2023-09-03 NOTE — Telephone Encounter (Signed)
Auth Submission: NO AUTH NEEDED Site of care: Site of care: [facility] Payer: Medicare A/B Medication & CPT/J Code(s) submitted: Leqvio (Inclisiran) J1306 Route of submission (phone, fax, portal):  Phone # Fax # Auth type: Buy/Bill PB Units/visits requested: 284mg  x 2 doses Reference number:  Approval from: 09/03/23 to 08/21/24   Patient will be responsible for 20%.

## 2023-09-10 ENCOUNTER — Ambulatory Visit: Payer: Medicare Other

## 2023-09-17 ENCOUNTER — Ambulatory Visit (INDEPENDENT_AMBULATORY_CARE_PROVIDER_SITE_OTHER): Payer: Medicare Other

## 2023-09-17 VITALS — BP 136/56 | HR 73 | Temp 97.6°F | Resp 20 | Ht 60.0 in | Wt 122.6 lb

## 2023-09-17 DIAGNOSIS — E785 Hyperlipidemia, unspecified: Secondary | ICD-10-CM | POA: Diagnosis not present

## 2023-09-17 MED ORDER — INCLISIRAN SODIUM 284 MG/1.5ML ~~LOC~~ SOSY
284.0000 mg | PREFILLED_SYRINGE | Freq: Once | SUBCUTANEOUS | Status: AC
  Administered 2023-09-17: 284 mg via SUBCUTANEOUS
  Filled 2023-09-17: qty 1.5

## 2023-09-17 NOTE — Progress Notes (Signed)
 Diagnosis: Hyperlipidemia  Provider:  Chilton Greathouse MD  Procedure: Injection  Leqvio (inclisiran), Dose: 284 mg, Site: subcutaneous, Number of injections: 1  Injection Site(s): Left lower quad. abdomen  Post Care:  left lower quad abdominal injection  Discharge: Condition: Good, Destination: Home . AVS Provided and AVS Declined  Performed by:  Rico Ala, LPN

## 2023-12-09 ENCOUNTER — Encounter (INDEPENDENT_AMBULATORY_CARE_PROVIDER_SITE_OTHER): Payer: Self-pay

## 2023-12-26 ENCOUNTER — Encounter: Payer: Self-pay | Admitting: Pulmonary Disease

## 2023-12-29 ENCOUNTER — Encounter: Payer: Self-pay | Admitting: Pulmonary Disease

## 2024-01-01 ENCOUNTER — Encounter: Payer: Self-pay | Admitting: Pulmonary Disease

## 2024-01-02 ENCOUNTER — Telehealth: Payer: Self-pay

## 2024-01-02 NOTE — Telephone Encounter (Signed)
 Auth Submission: APPROVED - insurance change Site of care: Site of care: [facility] Payer: [employer] medicare Medication & CPT/J Code(s) submitted: Leqvio  (Inclisiran) J1306 Diagnosis Code:  Route of submission (phone, fax, portal): portal Phone # Fax # Auth type: Buy/Bill PB Units/visits requested: 284mg  x 2 doses Reference number: [id number] Approval from: 01/01/24 to 12/31/24

## 2024-01-04 DIAGNOSIS — I6529 Occlusion and stenosis of unspecified carotid artery: Secondary | ICD-10-CM | POA: Insufficient documentation

## 2024-01-04 DIAGNOSIS — I890 Lymphedema, not elsewhere classified: Secondary | ICD-10-CM | POA: Insufficient documentation

## 2024-01-04 NOTE — Progress Notes (Unsigned)
 MRN : [id number]  [name] is a 63 y.o. (October 30, 1960) female who presents with chief complaint of check carotid arteries.  History of Present Illness:  The patient is seen for follow up evaluation of carotid stenosis status post right carotid stent placement on 01/26/2020.  A 9 x 7 exact stent was placed with the use of the NAV-6 embolic protection device in the right internal carotid artery. There were no post operative problems or complications related to the surgery.  The patient denies neck or incisional pain.  The patient denies interval amaurosis fugax. There is no recent history of TIA symptoms or focal motor deficits. There is no prior documented CVA.  The patient denies headache.  The patient is taking enteric-coated aspirin  81 mg daily.  The patient is also followed for left leg pain and leg swelling.  The swelling has persisted and the pain associated with swelling continues. There have not been any interval development of a ulcerations or wounds.   Since the previous visit the patient has been wearing graduated compression stockings and has noted little if any improvement in the lymphedema. The patient has been using compression routinely morning until night.   The patient also states elevation during the day and exercise is being done too.   Duplex ultrasound of the left leg venous system obtained February 05, 2023 is negative for DVT.  Deep venous system is fully compressible on the left.  No evidence of reflux is identified.   No outpatient medications have been marked as taking for the 01/05/24 encounter (Appointment) with Prescilla Brod, Ninette Basque, MD.    Past Medical History:  Diagnosis Date   Anxiety    Chest pain    Depression    Diabetes mellitus without complication (HCC)    Heart murmur    Normal echo 2007   History of kidney stones    Hypertension    MI, old    Negative nuclear stress test 2007   Neck pain    Stroke [facility])    mini  stroke    Past Surgical History:  Procedure Laterality Date   BACK SURGERY     x4, L4-5   CAROTID PTA/STENT INTERVENTION Right 01/26/2020   Procedure: CAROTID PTA/STENT INTERVENTION;  Surgeon: Jackquelyn Mass, MD;  Location: [facility] INVASIVE CV LAB;  Service: Cardiovascular;  Laterality: Right;   LEFT HEART CATH AND CORONARY ANGIOGRAPHY N/A 09/11/2018   Procedure: LEFT HEART CATH AND CORONARY ANGIOGRAPHY;  Surgeon: Arty Binning, MD;  Location: [facility];  Service: Cardiovascular;  Laterality: N/A;   TEE WITHOUT CARDIOVERSION N/A 08/21/2021   Procedure: TRANSESOPHAGEAL ECHOCARDIOGRAM (TEE);  Surgeon: Devorah Fonder, MD;  Location: [facility] ORS;  Service: Cardiovascular;  Laterality: N/A;   TUBAL LIGATION      Social History Social History   Tobacco Use   Smoking status: Former    Current packs/day: 0.00    Types: Cigarettes    Quit date: 08/21/2021    Years since quitting: 2.3   Smokeless tobacco: Never  Vaping Use   Vaping status: Never Used  Substance Use Topics   Alcohol use: Not Currently    Comment: occasionally   Drug use: Not Currently    Types: Marijuana    Family History Family History  Problem Relation Age of Onset   Kidney disease Mother    Heart disease  Mother    Stroke Mother    Hypertension Mother    Diabetes Mother    Heart disease Father 88       Died age 34   Heart attack Father    Hypertension Father    Hyperlipidemia Brother    Cancer Maternal Aunt     No Known Allergies   REVIEW OF SYSTEMS (Negative unless checked)  Constitutional: [] Weight loss  [] Fever  [] Chills Cardiac: [] Chest pain   [] Chest pressure   [] Palpitations   [] Shortness of breath when laying flat   [] Shortness of breath with exertion. Vascular:  [x] Pain in legs with walking   [] Pain in legs at rest  [] History of DVT   [] Phlebitis   [] Swelling in legs   [] Varicose veins   [] Non-healing ulcers Pulmonary:   [] Uses home oxygen   [] Productive cough   [] Hemoptysis   [] Wheeze   [] COPD   [] Asthma Neurologic:  [] Dizziness   [] Seizures   [] History of stroke   [] History of TIA  [] Aphasia   [] Vissual changes   [] Weakness or numbness in arm   [] Weakness or numbness in leg Musculoskeletal:   [] Joint swelling   [] Joint pain   [] Low back pain Hematologic:  [] Easy bruising  [] Easy bleeding   [] Hypercoagulable state   [] Anemic Gastrointestinal:  [] Diarrhea   [] Vomiting  [] Gastroesophageal reflux/heartburn   [] Difficulty swallowing. Genitourinary:  [] Chronic kidney disease   [] Difficult urination  [] Frequent urination   [] Blood in urine Skin:  [] Rashes   [] Ulcers  Psychological:  [] History of anxiety   []  History of major depression.  Physical Examination  There were no vitals filed for this visit. There is no height or weight on file to calculate BMI. Gen: WD/WN, NAD Head: [place]/AT, No temporalis wasting.  Ear/Nose/Throat: Hearing grossly intact, nares w/o erythema or drainage Eyes: PER, EOMI, sclera nonicteric.  Neck: Supple, no masses.  No bruit or JVD.  Pulmonary:  Good air movement, no audible wheezing, no use of accessory muscles.  Cardiac: RRR, normal S1, S2, no Murmurs. Vascular:  carotid bruit noted Vessel Right Left  Radial Palpable Palpable  Carotid  Palpable  Palpable  Subclav  Palpable Palpable  Gastrointestinal: soft, non-distended. No guarding/no peritoneal signs.  Musculoskeletal: M/S 5/5 throughout.  No visible deformity.  Neurologic: CN 2-12 intact. Pain and light touch intact in extremities.  Symmetrical.  Speech is fluent. Motor exam as listed above. Psychiatric: Judgment intact, Mood & affect appropriate for pt's clinical situation. Dermatologic: No rashes or ulcers noted.  No changes consistent with cellulitis.   CBC Lab Results  Component Value Date   WBC 6.5 08/21/2021   HGB 15.1 (H) 08/21/2021   HCT 45.3 08/21/2021   MCV 88.8 08/21/2021   PLT 200 08/21/2021    BMET    Component Value Date/Time   NA 139 08/21/2021 0620   NA 138  05/25/2013 1715   K 3.0 (L) 08/21/2021 0620   K 3.4 (L) 05/25/2013 1715   CL 102 08/21/2021 0620   CL 105 05/25/2013 1715   CO2 28 08/21/2021 0620   CO2 30 05/25/2013 1715   GLUCOSE 150 (H) 08/21/2021 0620   GLUCOSE 119 (H) 05/25/2013 1715   BUN 20 08/21/2021 0620   BUN 13 05/25/2013 1715   CREATININE 0.50 08/21/2021 0620   CREATININE 0.63 05/25/2013 1715   CREATININE 0.59 08/19/2011 1206   CALCIUM  9.6 08/21/2021 0620   CALCIUM  9.8 05/25/2013 1715   GFRNONAA >60 08/21/2021 0620   GFRNONAA >60 05/25/2013 1715  GFRAA >60 01/27/2020 0703   GFRAA >60 05/25/2013 1715   CrCl cannot be calculated (Patient's most recent lab result is older than the maximum 21 days allowed.).  COAG Lab Results  Component Value Date   INR 1.0 08/17/2021   INR  06/15/2009    1.00 DISREGARD  NOTIFIED JENNIFER AT 1200 ON 112510 BY HOOKER,B   INR 1.0 02/19/2008    Radiology No results found.   Assessment/Plan There are no diagnoses linked to this encounter.   Devon Fogo, MD  01/04/2024 3:36 PM

## 2024-01-05 ENCOUNTER — Ambulatory Visit (INDEPENDENT_AMBULATORY_CARE_PROVIDER_SITE_OTHER): Admitting: Vascular Surgery

## 2024-01-05 ENCOUNTER — Encounter (INDEPENDENT_AMBULATORY_CARE_PROVIDER_SITE_OTHER): Payer: Self-pay | Admitting: Vascular Surgery

## 2024-01-05 VITALS — BP 159/85 | HR 73 | Resp 18 | Ht 60.0 in | Wt 119.8 lb

## 2024-01-05 DIAGNOSIS — I6523 Occlusion and stenosis of bilateral carotid arteries: Secondary | ICD-10-CM

## 2024-01-05 DIAGNOSIS — I890 Lymphedema, not elsewhere classified: Secondary | ICD-10-CM | POA: Diagnosis not present

## 2024-01-05 DIAGNOSIS — I10 Essential (primary) hypertension: Secondary | ICD-10-CM

## 2024-01-05 DIAGNOSIS — E785 Hyperlipidemia, unspecified: Secondary | ICD-10-CM | POA: Diagnosis not present

## 2024-01-05 DIAGNOSIS — I70213 Atherosclerosis of native arteries of extremities with intermittent claudication, bilateral legs: Secondary | ICD-10-CM | POA: Diagnosis not present

## 2024-01-15 ENCOUNTER — Telehealth (INDEPENDENT_AMBULATORY_CARE_PROVIDER_SITE_OTHER): Payer: Self-pay | Admitting: Vascular Surgery

## 2024-01-15 NOTE — Telephone Encounter (Signed)
 ATC pt regarding referral that came in from Ronal Abbot, NP. We will have patient keep her original appt on 7.8.25 and add the carotid to the US  . VM full and unable to LM.

## 2024-01-26 ENCOUNTER — Other Ambulatory Visit (INDEPENDENT_AMBULATORY_CARE_PROVIDER_SITE_OTHER): Payer: Self-pay | Admitting: Nurse Practitioner

## 2024-01-26 DIAGNOSIS — I6523 Occlusion and stenosis of bilateral carotid arteries: Secondary | ICD-10-CM

## 2024-01-27 ENCOUNTER — Ambulatory Visit (INDEPENDENT_AMBULATORY_CARE_PROVIDER_SITE_OTHER)

## 2024-01-27 ENCOUNTER — Ambulatory Visit (INDEPENDENT_AMBULATORY_CARE_PROVIDER_SITE_OTHER): Admitting: Nurse Practitioner

## 2024-01-27 ENCOUNTER — Encounter (INDEPENDENT_AMBULATORY_CARE_PROVIDER_SITE_OTHER): Payer: Self-pay | Admitting: Nurse Practitioner

## 2024-01-27 ENCOUNTER — Other Ambulatory Visit (INDEPENDENT_AMBULATORY_CARE_PROVIDER_SITE_OTHER)

## 2024-01-27 VITALS — BP 148/70 | HR 60 | Resp 18 | Ht 60.0 in | Wt 121.2 lb

## 2024-01-27 DIAGNOSIS — I890 Lymphedema, not elsewhere classified: Secondary | ICD-10-CM

## 2024-01-27 DIAGNOSIS — I10 Essential (primary) hypertension: Secondary | ICD-10-CM | POA: Diagnosis not present

## 2024-01-27 DIAGNOSIS — I70213 Atherosclerosis of native arteries of extremities with intermittent claudication, bilateral legs: Secondary | ICD-10-CM

## 2024-01-27 DIAGNOSIS — I6523 Occlusion and stenosis of bilateral carotid arteries: Secondary | ICD-10-CM

## 2024-01-27 DIAGNOSIS — M545 Low back pain, unspecified: Secondary | ICD-10-CM

## 2024-01-27 DIAGNOSIS — G8929 Other chronic pain: Secondary | ICD-10-CM
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
## 2024-01-29 ENCOUNTER — Telehealth (INDEPENDENT_AMBULATORY_CARE_PROVIDER_SITE_OTHER): Payer: Self-pay

## 2024-01-29 LAB — VAS US ABI WITH/WO TBI
Left ABI: 0.75
Right ABI: 0.73

## 2024-01-29 NOTE — Telephone Encounter (Addendum)
 Spoke with the patient, 02/11/24 was offered to have her LLE angio with Dr. Jama. Patient stated she would call back if that date worked for her. Patient called back and is scheduled with Dr. Jama on 02/11/24 with a 11:45 am arrival time to the [facility]. Pre-procedure instructions were discussed and will be sent to Mychart and mailed.

## 2024-02-09 ENCOUNTER — Telehealth (INDEPENDENT_AMBULATORY_CARE_PROVIDER_SITE_OTHER): Payer: Self-pay

## 2024-02-09 NOTE — Telephone Encounter (Signed)
 I attempted to contact the patient to give her an updated arrival time to the [facility] for her LLE angio with Dr. Jama on 02/11/24. Patient's new arrival time is 2:15 pm instead of 11:45 am. A message was left for a return call.

## 2024-02-11 ENCOUNTER — Encounter
Admission: RE | Disposition: A | Discharge status: Home / Self Care | Payer: Self-pay | Source: Home / Self Care | Attending: Vascular Surgery

## 2024-02-11 ENCOUNTER — Ambulatory Visit
Admission: RE | Admit: 2024-02-11 | Discharge: 2024-02-11 | Disposition: A | Discharge status: Home / Self Care | Attending: Vascular Surgery | Admitting: Vascular Surgery

## 2024-02-11 DIAGNOSIS — Z87891 Personal history of nicotine dependence: Secondary | ICD-10-CM | POA: Diagnosis not present

## 2024-02-11 DIAGNOSIS — M51372 Other intervertebral disc degeneration, lumbosacral region with discogenic back pain and lower extremity pain: Secondary | ICD-10-CM | POA: Insufficient documentation

## 2024-02-11 DIAGNOSIS — I890 Lymphedema, not elsewhere classified: Secondary | ICD-10-CM | POA: Diagnosis not present

## 2024-02-11 DIAGNOSIS — I6523 Occlusion and stenosis of bilateral carotid arteries: Secondary | ICD-10-CM | POA: Insufficient documentation

## 2024-02-11 DIAGNOSIS — G8929 Other chronic pain: Secondary | ICD-10-CM | POA: Diagnosis not present

## 2024-02-11 DIAGNOSIS — Z79899 Other long term (current) drug therapy: Secondary | ICD-10-CM | POA: Insufficient documentation

## 2024-02-11 DIAGNOSIS — I700 Atherosclerosis of aorta: Secondary | ICD-10-CM | POA: Diagnosis not present

## 2024-02-11 DIAGNOSIS — I70229 Atherosclerosis of native arteries of extremities with rest pain, unspecified extremity: Secondary | ICD-10-CM

## 2024-02-11 DIAGNOSIS — I70213 Atherosclerosis of native arteries of extremities with intermittent claudication, bilateral legs: Secondary | ICD-10-CM | POA: Insufficient documentation

## 2024-02-11 DIAGNOSIS — I70222 Atherosclerosis of native arteries of extremities with rest pain, left leg: Secondary | ICD-10-CM

## 2024-02-11 DIAGNOSIS — E1151 Type 2 diabetes mellitus with diabetic peripheral angiopathy without gangrene: Secondary | ICD-10-CM | POA: Insufficient documentation

## 2024-02-11 DIAGNOSIS — I10 Essential (primary) hypertension: Secondary | ICD-10-CM | POA: Insufficient documentation

## 2024-02-11 DIAGNOSIS — Z7984 Long term (current) use of oral hypoglycemic drugs: Secondary | ICD-10-CM | POA: Insufficient documentation

## 2024-02-11 LAB — GLUCOSE, CAPILLARY
Glucose-Capillary: 108 mg/dL — ABNORMAL HIGH (ref 70–99)
Glucose-Capillary: 151 mg/dL — ABNORMAL HIGH (ref 70–99)

## 2024-02-11 LAB — CREATININE, SERUM
Creatinine, Ser: 0.51 mg/dL (ref 0.44–1.00)
GFR, Estimated: >60 mL/min (ref >60)

## 2024-02-11 LAB — BUN: BUN: 21 mg/dL (ref 8–23)

## 2024-02-11 SURGERY — LOWER EXTREMITY ANGIOGRAPHY
Anesthesia: Moderate Sedation | Site: Leg Lower | Laterality: Left

## 2024-02-11 MED ORDER — MIDAZOLAM HCL 5 MG/5ML IJ SOLN
INTRAMUSCULAR | Status: AC
  Filled 2024-02-11: qty 5

## 2024-02-11 MED ORDER — MIDAZOLAM HCL 2 MG/2ML IJ SOLN
INTRAMUSCULAR | Status: DC | PRN
  Administered 2024-02-11: .5 mg via INTRAVENOUS
  Administered 2024-02-11 (×2): 1 mg via INTRAVENOUS
  Administered 2024-02-11: 2 mg via INTRAVENOUS
  Administered 2024-02-11: .5 mg via INTRAVENOUS

## 2024-02-11 MED ORDER — OXYCODONE HCL 5 MG PO TABS
5.0000 mg | ORAL_TABLET | ORAL | Status: DC | PRN
  Administered 2024-02-11: 5 mg via ORAL

## 2024-02-11 MED ORDER — HYDRALAZINE HCL 20 MG/ML IJ SOLN
INTRAMUSCULAR | Status: AC
  Filled 2024-02-11: qty 1

## 2024-02-11 MED ORDER — FENTANYL CITRATE (PF) 100 MCG/2ML IJ SOLN
INTRAMUSCULAR | Status: DC | PRN
  Administered 2024-02-11 (×2): 12.5 ug via INTRAVENOUS
  Administered 2024-02-11 (×2): 25 ug via INTRAVENOUS
  Administered 2024-02-11: 50 ug via INTRAVENOUS

## 2024-02-11 MED ORDER — DIPHENHYDRAMINE HCL 50 MG/ML IJ SOLN: 50.0000 mg | Freq: Once | INTRAMUSCULAR | Status: DC | PRN

## 2024-02-11 MED ORDER — IODIXANOL 320 MG/ML IV SOLN
INTRAVENOUS | Status: DC | PRN
  Administered 2024-02-11: 100 mL

## 2024-02-11 MED ORDER — LABETALOL HCL 5 MG/ML IV SOLN: 10.0000 mg | INTRAVENOUS | Status: DC | PRN

## 2024-02-11 MED ORDER — METHYLPREDNISOLONE SODIUM SUCC 125 MG IJ SOLR: 125.0000 mg | Freq: Once | INTRAMUSCULAR | Status: DC | PRN

## 2024-02-11 MED ORDER — CLOPIDOGREL BISULFATE 300 MG PO TABS
300.0000 mg | ORAL_TABLET | ORAL | Status: AC
  Administered 2024-02-11: 300 mg via ORAL

## 2024-02-11 MED ORDER — ACETAMINOPHEN 325 MG PO TABS: 650.0000 mg | ORAL_TABLET | ORAL | Status: DC | PRN

## 2024-02-11 MED ORDER — FENTANYL CITRATE (PF) 100 MCG/2ML IJ SOLN
INTRAMUSCULAR | Status: AC
  Filled 2024-02-11: qty 2

## 2024-02-11 MED ORDER — CEFAZOLIN SODIUM-DEXTROSE 2-4 GM/100ML-% IV SOLN
INTRAVENOUS | Status: AC
Start: 2024-02-11 — End: 2024-02-11
  Filled 2024-02-11: qty 100

## 2024-02-11 MED ORDER — FENTANYL CITRATE PF 50 MCG/ML IJ SOSY
PREFILLED_SYRINGE | INTRAMUSCULAR | Status: AC
  Filled 2024-02-11: qty 1

## 2024-02-11 MED ORDER — HEPARIN SODIUM (PORCINE) 1000 UNIT/ML IJ SOLN
INTRAMUSCULAR | Status: AC
Start: 2024-02-11 — End: 2024-02-11
  Filled 2024-02-11: qty 10

## 2024-02-11 MED ORDER — SODIUM CHLORIDE 0.9 % IV SOLN: INTRAVENOUS | Status: DC

## 2024-02-11 MED ORDER — SODIUM CHLORIDE 0.9% FLUSH: 3.0000 mL | Freq: Two times a day (BID) | INTRAVENOUS | Status: DC

## 2024-02-11 MED ORDER — HEPARIN SODIUM (PORCINE) 1000 UNIT/ML IJ SOLN
INTRAMUSCULAR | Status: DC | PRN
  Administered 2024-02-11: 6000 [IU] via INTRAVENOUS

## 2024-02-11 MED ORDER — NITROGLYCERIN 1 MG/10 ML FOR IR/CATH LAB
INTRA_ARTERIAL | Status: DC | PRN
  Administered 2024-02-11: 500 ug via INTRA_ARTERIAL

## 2024-02-11 MED ORDER — ONDANSETRON HCL 4 MG/2ML IJ SOLN
INTRAMUSCULAR | Status: AC
Start: 2024-02-11 — End: 2024-02-11
  Filled 2024-02-11: qty 2

## 2024-02-11 MED ORDER — HYDROMORPHONE HCL 1 MG/ML IJ SOLN: 1.0000 mg | Freq: Once | INTRAMUSCULAR | Status: DC | PRN

## 2024-02-11 MED ORDER — MIDAZOLAM HCL 2 MG/2ML IJ SOLN
INTRAMUSCULAR | Status: AC
  Filled 2024-02-11: qty 2

## 2024-02-11 MED ORDER — MIDAZOLAM HCL 2 MG/ML PO SYRP: 8.0000 mg | ORAL_SOLUTION | Freq: Once | ORAL | Status: DC | PRN

## 2024-02-11 MED ORDER — CLOPIDOGREL BISULFATE 75 MG PO TABS
ORAL_TABLET | ORAL | Status: AC
Start: 2024-02-11 — End: 2024-02-11
  Filled 2024-02-11: qty 4

## 2024-02-11 MED ORDER — SODIUM CHLORIDE 0.9 % IV SOLN: 250.0000 mL | INTRAVENOUS | Status: DC | PRN

## 2024-02-11 MED ORDER — ONDANSETRON HCL 4 MG/2ML IJ SOLN: 4.0000 mg | Freq: Four times a day (QID) | INTRAMUSCULAR | Status: DC | PRN

## 2024-02-11 MED ORDER — MORPHINE SULFATE (PF) 2 MG/ML IV SOLN: 2.0000 mg | INTRAVENOUS | Status: DC | PRN

## 2024-02-11 MED ORDER — FAMOTIDINE 20 MG PO TABS: 40.0000 mg | ORAL_TABLET | Freq: Once | ORAL | Status: DC | PRN

## 2024-02-11 MED ORDER — LIDOCAINE HCL (PF) 1 % IJ SOLN
INTRAMUSCULAR | Status: DC | PRN
  Administered 2024-02-11: 10 mL

## 2024-02-11 MED ORDER — HEPARIN (PORCINE) IN NACL 1000-0.9 UT/500ML-% IV SOLN
INTRAVENOUS | Status: DC | PRN
  Administered 2024-02-11: 500 mL

## 2024-02-11 MED ORDER — ONDANSETRON HCL 4 MG/2ML IJ SOLN
4.0000 mg | Freq: Four times a day (QID) | INTRAMUSCULAR | Status: DC | PRN
  Administered 2024-02-11: 4 mg via INTRAVENOUS

## 2024-02-11 MED ORDER — CLOPIDOGREL BISULFATE 75 MG PO TABS: 75.0000 mg | ORAL_TABLET | Freq: Every day | ORAL | 5 refills | Status: DC

## 2024-02-11 MED ORDER — SODIUM CHLORIDE 0.9% FLUSH: 3.0000 mL | INTRAVENOUS | Status: DC | PRN

## 2024-02-11 MED ORDER — SODIUM CHLORIDE 0.9 % IV BOLUS
500.0000 mL | Freq: Once | INTRAVENOUS | Status: AC
  Administered 2024-02-11: 500 mL via INTRAVENOUS

## 2024-02-11 MED ORDER — CEFAZOLIN SODIUM-DEXTROSE 2-4 GM/100ML-% IV SOLN
2.0000 g | INTRAVENOUS | Status: AC
  Administered 2024-02-11: 2 g via INTRAVENOUS

## 2024-02-11 MED ORDER — HYDRALAZINE HCL 20 MG/ML IJ SOLN
INTRAMUSCULAR | Status: DC | PRN
  Administered 2024-02-11 (×2): 10 mg via INTRAVENOUS

## 2024-02-11 MED ORDER — OXYCODONE HCL 5 MG PO TABS
ORAL_TABLET | ORAL | Status: AC
  Filled 2024-02-11: qty 1

## 2024-02-11 MED ORDER — HYDRALAZINE HCL 20 MG/ML IJ SOLN: 5.0000 mg | INTRAMUSCULAR | Status: DC | PRN

## 2024-02-11 SURGICAL SUPPLY — 27 items
BALLOON LUTONIX 018 4X300X130 (BALLOONS) IMPLANT
BALLOON LUTONIX 018 5X300X130 (BALLOONS) IMPLANT
BALLOON LUTONIX 018 6X40X130 (BALLOONS) IMPLANT
BALLOON ULTRVRSE 5X150X150 (BALLOONS) IMPLANT
CATH ANGIO 5F PIGTAIL 65CM (CATHETERS) IMPLANT
CATH BEACON 5 .035 65 KMP TIP (CATHETERS) IMPLANT
CATH BEACON 5 .038 100 VERT TP (CATHETERS) IMPLANT
CATH ROTAREX 135 6FR (CATHETERS) IMPLANT
CATH SEEKER .035X90 (CATHETERS) IMPLANT
COVER PROBE ULTRASOUND 5X96 (MISCELLANEOUS) IMPLANT
DEVICE PRESTO INFLATION (MISCELLANEOUS) IMPLANT
DEVICE TORQUE (MISCELLANEOUS) IMPLANT
DEVICE VASC CLSR CELT ART 6 (Vascular Products) IMPLANT
GLIDEWIRE ADV .035X260CM (WIRE) IMPLANT
GOWN STRL REUS W/ TWL LRG LVL3 (GOWN DISPOSABLE) ×1 IMPLANT
NDL ENTRY 21GA 7CM ECHOTIP (NEEDLE) IMPLANT
NEEDLE ENTRY 21GA 7CM ECHOTIP (NEEDLE) ×1 IMPLANT
PACK ANGIOGRAPHY (CUSTOM PROCEDURE TRAY) ×1 IMPLANT
SET INTRO CAPELLA COAXIAL (SET/KITS/TRAYS/PACK) IMPLANT
SHEATH ANL2 6FRX45 HC (SHEATH) IMPLANT
SHEATH BRITE TIP 6FRX11 (SHEATH) IMPLANT
STENT LIFESTENT 5F 6X150X135 (Permanent Stent) IMPLANT
STENT LIFESTENT 5F 7X80X135 (Permanent Stent) IMPLANT
SYR MEDRAD MARK 7 150ML (SYRINGE) IMPLANT
TUBING CONTRAST HIGH PRESS 72 (TUBING) IMPLANT
WIRE G V18X300CM (WIRE) IMPLANT
WIRE J 3MM .035X145CM (WIRE) IMPLANT

## 2024-02-11 NOTE — Interval H&P Note (Signed)
 History and Physical Interval Note:  02/11/2024 2:52 PM  [name]  has presented today for surgery, with the diagnosis of LLE Angio   ASO w rest pain.  The various methods of treatment have been discussed with the patient and family. After consideration of risks, benefits and other options for treatment, the patient has consented to  Procedure(s): Lower Extremity Angiography (Left) as a surgical intervention.  The patient's history has been reviewed, patient examined, no change in status, stable for surgery.  I have reviewed the patient's chart and labs.  Questions were answered to the patient's satisfaction.     Cordella Shawl

## 2024-02-11 NOTE — Op Note (Signed)
 [facility]  Percutaneous Study/Intervention Procedural Note   Date of Surgery: 02/11/2024  Surgeon:  Cordella JUDITHANN Shawl, MD.  Pre-operative Diagnosis: Atherosclerotic occlusive disease bilateral lower extremities with rest pain of the left lower extremity  Post-operative diagnosis:  Same  Procedure(s) Performed:             1.  Introduction catheter into left lower extremity 3rd order catheter placement              2.    Contrast injection left lower extremity for distal runoff             3.  Percutaneous transluminal angioplasty and stent placement left superficial femoral and above-knee popliteal artery.              4.  Atherectomy left SFA and above-knee popliteal with the [place] catheter.                 5.  Celt closure right common femoral arteriotomy  Anesthesia: Conscious sedation was administered under my direct supervision by the interventional radiology RN. IV Versed  plus fentanyl  were utilized. Continuous ECG, pulse oximetry and blood pressure was monitored throughout the entire procedure.  Conscious sedation was for a total of 1 hour 33 minutes and 41 seconds.  Sheath: 6 [place] Ansell right common femoral retrograde  Contrast: 100 cc  Fluoroscopy Time: 16.5 minutes  Indications:  [name] presents with worsening pain of the left lower extremity.  She has documented atherosclerotic occlusive disease.  She is now having rest pain symptoms as well as lifestyle limiting claudication.  This places her at increased risk for limb loss.  Angiography with hope for intervention has been recommended.  The risks and benefits are reviewed all questions answered patient agrees to proceed.  Procedure:  [name] is a 63 y.o. y.o. female who was identified and appropriate procedural time out was performed.  The patient was then placed supine on the table and prepped and draped in the usual sterile fashion.    Ultrasound was placed in the  sterile sleeve and the right groin was evaluated the right common femoral artery was echolucent and pulsatile indicating patency.  Image was recorded for the permanent record and under real-time visualization a microneedle was inserted into the common femoral artery microwire followed by a micro-sheath.  A J-wire was then advanced through the micro-sheath and a  5 [place] sheath was then inserted over a J-wire. J-wire was then advanced and a 5 French pigtail catheter was positioned at the level of T12. AP projection of the aorta was then obtained. Pigtail catheter was repositioned to above the bifurcation and a RAO view of the pelvis was obtained.  Subsequently a pigtail catheter with the stiff angle Glidewire was used to cross the aortic bifurcation the catheter wire were advanced down into the left distal external iliac artery. Oblique view of the femoral bifurcation was then obtained and subsequently the wire was reintroduced and the pigtail catheter negotiated into the SFA representing third order catheter placement. Distal runoff was then performed.  6000 units of heparin  was then given and allowed to circulate and a 6 [place] Ansell sheath was advanced up and over the bifurcation and positioned in the femoral artery  Using a combination of a seeker catheter and a KMP catheter with an advantage Glidewire I was able to negotiate the wire catheter combination down into the mid popliteal.  Hand-injection of contrast verified intraluminal position and  distal runoff was then completed by hand injection through the catheter.  A V18 wire was then reintroduced and positioned in the mid peroneal.  The [place] thromboatherectomy catheter was then prepped on the field advanced over the wire and atherectomy was performed from the origin of the SFA down to the above-knee popliteal.  Follow-up imaging now demonstrated recanalization with flow in the SFA and popliteal but there were multiple areas of greater than 90%  stenosis remaining.  A 4 mm x 300 mm Lutonix drug-eluting balloon was then used to angioplasty the SFA in its entirety and the proximal half of the popliteal.  Inflation was to 10 atm for 1 minute.  Working proximally initially follow-up imaging demonstrated greater than 80% residual stenosis throughout the proximal 60 mm of the SFA.  To try to treat the origin itself the then advanced a 6 mm x 40 mm Lutonix drug-eluting balloon across the origin of the SFA angioplasty was to 10 atm for approximately 1 minute.  Follow-up imaging demonstrated greater than 70% residual stenosis and I selected a 7 mm x 80 mm life stent and deployed this so that the leading edge of the stent was right at the origin.  It was then postdilated with the 6 mm x 40 mm balloon several inflations were required.  Follow-up imaging that demonstrated less than 10% residual stenosis.  Working more distally now in the region of Hunter's canal there was greater than 80% residual stenosis at multiple locations.  Initially I tried a 5 mm x 300 mm Lutonix drug-eluting balloon and treated from the mid popliteal proximally.  Inflation was to 10 atm for 1 minute.  Follow-up imaging right at [facility] canal demonstrated greater than 70% residual stenosis and a 6 mm x 150 mm life stent was deployed across this region and postdilated with a 6 mm x 150 mm Ultraverse balloon inflated to 8 atm for 1 minute.  Follow-up imaging now demonstrated wide patency of the SFA and popliteal with preservation of the three-vessel runoff to the foot.    After review of these images the sheath is pulled into the right external iliac oblique of the common femoral is obtained and a Star close device deployed. There no immediate complications.   Findings:  The abdominal aorta is opacified with a bolus injection contrast. Renal arteries are single and widely patent. The aorta itself has diffuse disease but no hemodynamically significant lesions. The common and external iliac  arteries are widely patent bilaterally.  The left common femoral is widely patent as is the profunda femoris.  The SFA demonstrates a flush occlusion.  There is reconstitution of the above-knee popliteal at Hunter's canal.  There is moderate disease in the proximal popliteal.  From the level of the femoral condyles distally the popliteal is patent and free of hemodynamically significant stenosis and there is three-vessel runoff to the foot, although the tibials do demonstrate diffuse disease there are no focal hemodynamically significant stenoses identified.    Following atherectomy there is now recanalization with multiple critical lesions uncovered  Following angioplasty and stent placement in 2 locations the SFA and popliteal are now patent with in-line flow and looks quite nice.     Summary: Successful recanalization left lower extremity for limb salvage                        Disposition: Patient was taken to the recovery room in stable condition having tolerated the procedure well.  Galena Logie,  Cordella MATSU 02/11/2024,5:16 PM

## 2024-02-11 NOTE — Interval H&P Note (Signed)
 History and Physical Interval Note:  02/11/2024 2:51 PM  [name]  has presented today for surgery, with the diagnosis of LLE Angio   ASO w rest pain.  The various methods of treatment have been discussed with the patient and family. After consideration of risks, benefits and other options for treatment, the patient has consented to  Procedure(s): Lower Extremity Angiography (Left) as a surgical intervention.  The patient's history has been reviewed, patient examined, no change in status, stable for surgery.  I have reviewed the patient's chart and labs.  Questions were answered to the patient's satisfaction.     Cordella Shawl

## 2024-02-11 NOTE — Interval H&P Note (Signed)
 History and Physical Interval Note:  02/11/2024 2:53 PM  [name]  has presented today for surgery, with the diagnosis of LLE Angio   ASO w rest pain.  The various methods of treatment have been discussed with the patient and family. After consideration of risks, benefits and other options for treatment, the patient has consented to  Procedure(s): Lower Extremity Angiography (Left) as a surgical intervention.  The patient's history has been reviewed, patient examined, no change in status, stable for surgery.  I have reviewed the patient's chart and labs.  Questions were answered to the patient's satisfaction.     Cordella Shawl

## 2024-02-12 ENCOUNTER — Encounter: Payer: Self-pay | Admitting: Vascular Surgery

## 2024-02-16 ENCOUNTER — Telehealth (INDEPENDENT_AMBULATORY_CARE_PROVIDER_SITE_OTHER): Payer: Self-pay

## 2024-02-16 NOTE — Telephone Encounter (Signed)
 Patient left voicemail stating she had an L leg Angio Wednesday 02/11/2024. Patient stated her incision site is fine at this time, but she is having pain in her left foot and on the inside of her left ankle. Patient stated she has been taking tylenol  every 4 hours to control the pain and elevating. Patient just wanted to know if she should be feeling better in about a week or if there was any further advice. Patient stated she hated ice, but would be willing to try heat at this time. Please advise.

## 2024-02-25 ENCOUNTER — Telehealth (INDEPENDENT_AMBULATORY_CARE_PROVIDER_SITE_OTHER): Payer: Self-pay

## 2024-02-25 NOTE — Telephone Encounter (Signed)
 We can see if we can move her follow-up sooner

## 2024-02-25 NOTE — Telephone Encounter (Signed)
 Patient called stating that she has a hump that has come up on her left leg since her LLE angio on 02/11/24 with Dr. Jama. Patient states she has pain from her knee down to her foot. Patient has an appt for ABI and be seen on 03/11/24. Please advise.

## 2024-02-26 ENCOUNTER — Other Ambulatory Visit (INDEPENDENT_AMBULATORY_CARE_PROVIDER_SITE_OTHER): Payer: Self-pay | Admitting: Nurse Practitioner

## 2024-02-26 ENCOUNTER — Emergency Department

## 2024-02-26 ENCOUNTER — Other Ambulatory Visit: Payer: Self-pay

## 2024-02-26 ENCOUNTER — Encounter: Payer: Self-pay | Admitting: Emergency Medicine

## 2024-02-26 ENCOUNTER — Emergency Department
Admission: EM | Admit: 2024-02-26 | Discharge: 2024-02-26 | Disposition: A | Discharge status: Home / Self Care | Attending: Emergency Medicine | Admitting: Emergency Medicine

## 2024-02-26 DIAGNOSIS — M7989 Other specified soft tissue disorders: Secondary | ICD-10-CM | POA: Insufficient documentation

## 2024-02-26 DIAGNOSIS — Z9889 Other specified postprocedural states: Secondary | ICD-10-CM

## 2024-02-26 LAB — CBC
HCT: 36.8 % (ref 36.0–46.0)
Hemoglobin: 12.4 g/dL (ref 12.0–15.0)
MCH: 30.3 pg (ref 26.0–34.0)
MCHC: 33.7 g/dL (ref 30.0–36.0)
MCV: 90 fL (ref 80.0–100.0)
Platelets: 317 K/uL (ref 150–400)
RBC: 4.09 MIL/uL (ref 3.87–5.11)
RDW: 14.5 % (ref 11.5–15.5)
WBC: 8.9 K/uL (ref 4.0–10.5)
nRBC: 0 % (ref 0.0–0.2)

## 2024-02-26 LAB — BASIC METABOLIC PANEL WITH GFR
Anion gap: 10 (ref 5–15)
BUN: 28 mg/dL — ABNORMAL HIGH (ref 8–23)
CO2: 29 mmol/L (ref 22–32)
Calcium: 9.8 mg/dL (ref 8.9–10.3)
Chloride: 105 mmol/L (ref 98–111)
Creatinine, Ser: 0.55 mg/dL (ref 0.44–1.00)
GFR, Estimated: >60 mL/min (ref >60)
Glucose, Bld: 91 mg/dL (ref 70–99)
Potassium: 3.6 mmol/L (ref 3.5–5.1)
Sodium: 144 mmol/L (ref 135–145)

## 2024-02-26 NOTE — ED Triage Notes (Signed)
 Pt via POV from home. Pt had 2 stents placed on 7/23 on the L leg. Pt has swelling to the L leg and pain. Doctor wanted her to come to rule out a DVT. Pt is A&OX4 and NAD

## 2024-02-26 NOTE — ED Provider Notes (Signed)
 [facility] Provider Note    Event Date/Time   First MD Initiated Contact with Patient 02/26/24 1801     (approximate)   History   Leg Swelling   HPI  PAYTAN RECINE is a 63 y.o. female who presents to the ED for evaluation of Leg Swelling   I reviewed 7/23 vascular operative note.  Left leg SFA and popliteal angioplasty and stent placement  Patient presents to the ED alongside her husband for DVT rule out of her left leg.  Sent in by her PCP.  Saw her PCP this morning who directed her directly to the vascular office who could not accommodate the patient today so she comes to the ED.  Patient reports compliance with her aspirin  and Plavix .  No known traumas or discrete injuries but she has swelling and bruising to the anterior and lateral aspect of her left lower leg.  She reports seeing a mild bump over area of soft tissue swelling centrally to the anterior and lateral aspect of her lower leg, and small areas of bruising distal and proximal to this.  She did not recall any particular trauma   Physical Exam   Triage Vital Signs: ED Triage Vitals  Encounter Vitals Group     BP 02/26/24 1720 (!) 176/71     Girls Systolic BP Percentile --      Girls Diastolic BP Percentile --      Boys Systolic BP Percentile --      Boys Diastolic BP Percentile --      Pulse Rate 02/26/24 1720 74     Resp 02/26/24 1720 20     Temp 02/26/24 1720 98 F (36.7 C)     Temp Source 02/26/24 1720 Oral     SpO2 02/26/24 1720 98 %     Weight 02/26/24 1719 120 lb (54.4 kg)     Height 02/26/24 1719 5' (1.524 m)     Head Circumference --      Peak Flow --      Pain Score 02/26/24 1719 7     Pain Loc --      Pain Education --      Exclude from Growth Chart --     Most recent vital signs: Vitals:   02/26/24 1720  BP: (!) 176/71  Pulse: 74  Resp: 20  Temp: 98 F (36.7 C)  SpO2: 98%    General: Awake, no distress.  Well-appearing overall CV:  Good peripheral  perfusion.  Resp:  Normal effort.  Abd:  No distention.  MSK:  Left leg is well-perfused, warm, brisk capillary refill, strong palpable DP pulse.  No discrete signs of trauma such as an abrasion or laceration, no significant bruising. To the mid aspect of the left lower leg, anterolateral, there is a subtle area of soft tissue swelling that is mildly tender to palpation.  No fluctuance. Neuro:  No focal deficits appreciated. Other:     ED Results / Procedures / Treatments   Labs (all labs ordered are listed, but only abnormal results are displayed) Labs Reviewed  BASIC METABOLIC PANEL WITH GFR - Abnormal; Notable for the following components:      Result Value   BUN 28 (*)    All other components within normal limits  CBC    EKG   RADIOLOGY Ultrasound of the left leg interpreted by me without signs of DVT.  Official radiology report(s): US  Venous Img Lower Unilateral Left Result Date: 02/26/2024 CLINICAL DATA:  Left lower extremity swelling and pain for 1 day EXAM: LEFT LOWER EXTREMITY VENOUS DOPPLER ULTRASOUND TECHNIQUE: Gray-scale sonography with compression, as well as color and duplex ultrasound, were performed to evaluate the deep venous system(s) from the level of the common femoral vein through the popliteal and proximal calf veins. COMPARISON:  None Available. FINDINGS: VENOUS Normal compressibility of the common femoral, superficial femoral, and popliteal veins, as well as the visualized calf veins. Visualized portions of profunda femoral vein and great saphenous vein unremarkable. No filling defects to suggest DVT on grayscale or color Doppler imaging. Doppler waveforms show normal direction of venous flow, normal respiratory plasticity and response to augmentation. Limited views of the contralateral common femoral vein are unremarkable. OTHER None. Limitations: none IMPRESSION: 1. No evidence of deep venous thrombosis within the left lower extremity. Electronically Signed   By:  Ozell Daring M.D.   On: 02/26/2024 18:29    PROCEDURES and INTERVENTIONS:  Procedures  Medications - No data to display   IMPRESSION / MDM / ASSESSMENT AND PLAN / ED COURSE  I reviewed the triage vital signs and the nursing notes.  Differential diagnosis includes, but is not limited to, hematoma, abscess, cyst, DVT, arterial ischemia or clotted off stent  {Patient presents with symptoms of an acute illness or injury that is potentially life-threatening.  Patient with recent arterial revascularization of her left leg presents with a subtle area of swelling that is likely hematoma, but without evidence of DVT and suitable for outpatient management.  Look systemically well without systemic symptoms.  Well-perfused leg without signs of ischemia.  Normal CBC and metabolic panel.  Reassuring ultrasound.  Suitable for outpatient management     FINAL CLINICAL IMPRESSION(S) / ED DIAGNOSES   Final diagnoses:  Left leg swelling     Rx / DC Orders   ED Discharge Orders     None        Note:  This document was prepared using Dragon voice recognition software and may include unintentional dictation errors.   Claudene Rover, MD 02/26/24 [id number]

## 2024-02-26 NOTE — Discharge Instructions (Signed)
 No signs of DVT/blood clot in the leg  Blood flow looks good from recent arterial procedures  Continue aspirin  and Plavix   Elevate, gentle compression, keep walking  Return to the ED with any worsening symptoms, otherwise follow-up with your PCP

## 2024-02-27 ENCOUNTER — Other Ambulatory Visit (INDEPENDENT_AMBULATORY_CARE_PROVIDER_SITE_OTHER)

## 2024-02-27 DIAGNOSIS — I739 Peripheral vascular disease, unspecified: Secondary | ICD-10-CM

## 2024-02-27 DIAGNOSIS — Z9889 Other specified postprocedural states: Secondary | ICD-10-CM

## 2024-03-01 LAB — VAS US ABI WITH/WO TBI
Left ABI: 1.09
Right ABI: 0.88

## 2024-03-02 ENCOUNTER — Ambulatory Visit (INDEPENDENT_AMBULATORY_CARE_PROVIDER_SITE_OTHER): Admitting: Vascular Surgery

## 2024-03-02 ENCOUNTER — Encounter (INDEPENDENT_AMBULATORY_CARE_PROVIDER_SITE_OTHER): Payer: Self-pay | Admitting: Vascular Surgery

## 2024-03-02 VITALS — BP 136/67 | HR 69 | Resp 16 | Wt 119.0 lb

## 2024-03-02 DIAGNOSIS — Z9889 Other specified postprocedural states: Secondary | ICD-10-CM

## 2024-03-02 DIAGNOSIS — I10 Essential (primary) hypertension: Secondary | ICD-10-CM | POA: Diagnosis not present

## 2024-03-02 DIAGNOSIS — M79605 Pain in left leg: Secondary | ICD-10-CM | POA: Diagnosis not present

## 2024-03-02 DIAGNOSIS — E785 Hyperlipidemia, unspecified: Secondary | ICD-10-CM | POA: Diagnosis not present

## 2024-03-02 DIAGNOSIS — I739 Peripheral vascular disease, unspecified: Secondary | ICD-10-CM | POA: Diagnosis not present
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
## 2024-03-11 ENCOUNTER — Encounter (INDEPENDENT_AMBULATORY_CARE_PROVIDER_SITE_OTHER)

## 2024-03-11 ENCOUNTER — Ambulatory Visit (INDEPENDENT_AMBULATORY_CARE_PROVIDER_SITE_OTHER): Admitting: Vascular Surgery

## 2024-03-16 ENCOUNTER — Ambulatory Visit (INDEPENDENT_AMBULATORY_CARE_PROVIDER_SITE_OTHER): Payer: Medicare Other | Admitting: *Deleted

## 2024-03-16 VITALS — BP 182/75 | HR 58 | Temp 97.4°F | Resp 20 | Ht 60.0 in | Wt 120.6 lb

## 2024-03-16 DIAGNOSIS — E785 Hyperlipidemia, unspecified: Secondary | ICD-10-CM

## 2024-03-16 MED ORDER — INCLISIRAN SODIUM 284 MG/1.5ML ~~LOC~~ SOSY
284.0000 mg | PREFILLED_SYRINGE | Freq: Once | SUBCUTANEOUS | Status: AC
  Administered 2024-03-16: 284 mg via SUBCUTANEOUS
  Filled 2024-03-16: qty 1.5

## 2024-03-16 NOTE — Progress Notes (Signed)
 Diagnosis: Hyperlipidemia  Provider:  Mannam, Praveen MD  Procedure: Injection  Leqvio  (inclisiran), Dose: 284 mg, Site: subcutaneous, Number of injections: 1  Injection Site(s): Right upper quad. abdomen  Post Care: Patient declined observation  Discharge: Condition: Good, Destination: Home . AVS Declined  Performed by:  Neylan Koroma E, RN

## 2024-03-18 ENCOUNTER — Encounter (INDEPENDENT_AMBULATORY_CARE_PROVIDER_SITE_OTHER): Payer: Self-pay | Admitting: Vascular Surgery

## 2024-04-16 ENCOUNTER — Encounter: Payer: Self-pay | Admitting: Family Medicine

## 2024-04-16 ENCOUNTER — Other Ambulatory Visit: Payer: Self-pay | Admitting: Family Medicine

## 2024-04-16 DIAGNOSIS — M79605 Pain in left leg: Secondary | ICD-10-CM

## 2024-04-16 DIAGNOSIS — R2242 Localized swelling, mass and lump, left lower limb: Secondary | ICD-10-CM

## 2024-04-19 ENCOUNTER — Ambulatory Visit
Admission: RE | Admit: 2024-04-19 | Discharge: 2024-04-19 | Disposition: A | Discharge status: Home / Self Care | Source: Ambulatory Visit | Attending: Family Medicine | Admitting: Family Medicine

## 2024-04-19 DIAGNOSIS — M79605 Pain in left leg: Secondary | ICD-10-CM | POA: Diagnosis present

## 2024-04-19 DIAGNOSIS — R2242 Localized swelling, mass and lump, left lower limb: Secondary | ICD-10-CM | POA: Diagnosis present

## 2024-04-21 ENCOUNTER — Other Ambulatory Visit (INDEPENDENT_AMBULATORY_CARE_PROVIDER_SITE_OTHER): Payer: Self-pay | Admitting: Vascular Surgery

## 2024-06-01 ENCOUNTER — Other Ambulatory Visit (INDEPENDENT_AMBULATORY_CARE_PROVIDER_SITE_OTHER): Payer: Self-pay | Admitting: Vascular Surgery

## 2024-06-01 DIAGNOSIS — Z9889 Other specified postprocedural states: Secondary | ICD-10-CM

## 2024-06-02 ENCOUNTER — Ambulatory Visit (INDEPENDENT_AMBULATORY_CARE_PROVIDER_SITE_OTHER)

## 2024-06-02 ENCOUNTER — Ambulatory Visit (INDEPENDENT_AMBULATORY_CARE_PROVIDER_SITE_OTHER): Admitting: Vascular Surgery

## 2024-06-02 ENCOUNTER — Encounter (INDEPENDENT_AMBULATORY_CARE_PROVIDER_SITE_OTHER): Payer: Self-pay | Admitting: Vascular Surgery

## 2024-06-02 VITALS — BP 159/57 | HR 62 | Resp 16 | Ht 60.0 in | Wt 126.6 lb

## 2024-06-02 DIAGNOSIS — I10 Essential (primary) hypertension: Secondary | ICD-10-CM

## 2024-06-02 DIAGNOSIS — I739 Peripheral vascular disease, unspecified: Secondary | ICD-10-CM | POA: Diagnosis not present

## 2024-06-02 DIAGNOSIS — E785 Hyperlipidemia, unspecified: Secondary | ICD-10-CM

## 2024-06-02 DIAGNOSIS — Z9889 Other specified postprocedural states: Secondary | ICD-10-CM | POA: Diagnosis not present
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
## 2024-06-07 LAB — VAS US ABI WITH/WO TBI
Left ABI: 1.15
Right ABI: 1.03

## 2024-07-29 ENCOUNTER — Telehealth (HOSPITAL_COMMUNITY): Payer: Self-pay | Admitting: Family Medicine

## 2024-07-29 NOTE — Telephone Encounter (Signed)
 Patient referred to infusion pharmacy team for ambulatory infusion of Leqvio .  Insurance - Medicare Part A/B Site of care - Site of care: [facility] Dx code - [phone] Therapy - Provider renewed orders for Leqvio  284 mg every 6 months.  Infusion appointments - Scheduling team will schedule patient as soon as possible.    Thank you,  Norton Blush, PharmD, [facility] Pharmacist [facility]

## 2024-07-30 ENCOUNTER — Telehealth (HOSPITAL_COMMUNITY): Payer: Self-pay

## 2024-07-30 NOTE — Telephone Encounter (Signed)
 Auth Submission: APPROVED Site of care: Site of care: [facility] Payer: [facility] Medicare Medication & CPT/J Code(s) submitted: Leqvio  (Inclisiran) J1306 Diagnosis Code: E78.5 Route of submission (phone, fax, portal): portal Phone # Fax # Auth type: Buy/Bill HB Units/visits requested: 284mg  q5months Reference number: [id number] Approval from: 07/29/24 to 07/29/25

## 2024-09-02 ENCOUNTER — Encounter (INDEPENDENT_AMBULATORY_CARE_PROVIDER_SITE_OTHER)

## 2024-09-02 ENCOUNTER — Ambulatory Visit (INDEPENDENT_AMBULATORY_CARE_PROVIDER_SITE_OTHER): Admitting: Nurse Practitioner

## 2024-09-13 ENCOUNTER — Ambulatory Visit

## 2024-09-17 ENCOUNTER — Ambulatory Visit

## 2025-03-14 ENCOUNTER — Ambulatory Visit
